# Patient Record
Sex: Female | Born: 1950 | Race: White | Hispanic: No | Smoking: Former smoker
Health system: Southern US, Community
[De-identification: ages and names within clinical notes are randomized; demographics above are authoritative.]

## PROBLEM LIST (undated history)

## (undated) DIAGNOSIS — I219 Acute myocardial infarction, unspecified: Secondary | ICD-10-CM

## (undated) DIAGNOSIS — I2510 Atherosclerotic heart disease of native coronary artery without angina pectoris: Secondary | ICD-10-CM

## (undated) DIAGNOSIS — Z720 Tobacco use: Secondary | ICD-10-CM

## (undated) DIAGNOSIS — I10 Essential (primary) hypertension: Secondary | ICD-10-CM

## (undated) DIAGNOSIS — M1990 Unspecified osteoarthritis, unspecified site: Secondary | ICD-10-CM

## (undated) DIAGNOSIS — E785 Hyperlipidemia, unspecified: Secondary | ICD-10-CM

## (undated) HISTORY — DX: Atherosclerotic heart disease of native coronary artery without angina pectoris: I25.10

## (undated) HISTORY — DX: Tobacco use: Z72.0

## (undated) HISTORY — DX: Hyperlipidemia, unspecified: E78.5

## (undated) HISTORY — PX: ABDOMINAL HYSTERECTOMY: SHX81

## (undated) HISTORY — PX: TUBAL LIGATION: SHX77

---

## 2009-08-17 ENCOUNTER — Inpatient Hospital Stay (HOSPITAL_COMMUNITY): Admission: EM | Admit: 2009-08-17 | Discharge: 2009-08-21 | Payer: Self-pay | Admitting: Internal Medicine

## 2009-08-17 ENCOUNTER — Ambulatory Visit: Payer: Self-pay | Admitting: Internal Medicine

## 2009-08-17 DIAGNOSIS — I219 Acute myocardial infarction, unspecified: Secondary | ICD-10-CM

## 2009-08-17 HISTORY — DX: Acute myocardial infarction, unspecified: I21.9

## 2009-08-17 HISTORY — PX: CORONARY ANGIOPLASTY WITH STENT PLACEMENT: SHX49

## 2009-08-18 ENCOUNTER — Encounter: Payer: Self-pay | Admitting: Cardiovascular Disease

## 2009-09-02 ENCOUNTER — Telehealth: Payer: Self-pay | Admitting: Cardiovascular Disease

## 2009-09-09 DIAGNOSIS — E7800 Pure hypercholesterolemia, unspecified: Secondary | ICD-10-CM | POA: Insufficient documentation

## 2009-09-09 DIAGNOSIS — F17200 Nicotine dependence, unspecified, uncomplicated: Secondary | ICD-10-CM

## 2009-09-09 DIAGNOSIS — I2119 ST elevation (STEMI) myocardial infarction involving other coronary artery of inferior wall: Secondary | ICD-10-CM | POA: Insufficient documentation

## 2009-09-09 DIAGNOSIS — I25110 Atherosclerotic heart disease of native coronary artery with unstable angina pectoris: Secondary | ICD-10-CM

## 2009-09-09 DIAGNOSIS — I959 Hypotension, unspecified: Secondary | ICD-10-CM

## 2009-09-15 ENCOUNTER — Ambulatory Visit: Payer: Self-pay | Admitting: Cardiovascular Disease

## 2009-11-08 ENCOUNTER — Ambulatory Visit: Payer: Self-pay | Admitting: Cardiovascular Disease

## 2009-11-16 LAB — CONVERTED CEMR LAB
ALT: 19 units/L (ref 0–35)
AST: 21 units/L (ref 0–37)
Alkaline Phosphatase: 73 units/L (ref 39–117)
Bilirubin, Direct: 0.1 mg/dL (ref 0.0–0.3)
Cholesterol: 161 mg/dL (ref 0–200)
LDL Cholesterol: 88 mg/dL (ref 0–99)
Total Bilirubin: 1.1 mg/dL (ref 0.3–1.2)
Total CHOL/HDL Ratio: 3
Triglycerides: 101 mg/dL (ref 0.0–149.0)
VLDL: 20.2 mg/dL (ref 0.0–40.0)

## 2009-11-21 ENCOUNTER — Telehealth: Payer: Self-pay | Admitting: Cardiovascular Disease

## 2009-12-20 ENCOUNTER — Telehealth: Payer: Self-pay | Admitting: Cardiovascular Disease

## 2010-01-02 ENCOUNTER — Encounter (INDEPENDENT_AMBULATORY_CARE_PROVIDER_SITE_OTHER): Payer: Self-pay | Admitting: *Deleted

## 2010-01-02 ENCOUNTER — Telehealth: Payer: Self-pay | Admitting: Cardiovascular Disease

## 2010-03-07 ENCOUNTER — Ambulatory Visit: Payer: Self-pay

## 2010-03-07 ENCOUNTER — Ambulatory Visit: Payer: Self-pay | Admitting: Cardiovascular Disease

## 2010-05-03 ENCOUNTER — Telehealth: Payer: Self-pay | Admitting: Cardiovascular Disease

## 2010-06-06 NOTE — Assessment & Plan Note (Signed)
Summary: EPH/JSS   Visit Type:  EPH Primary Provider:  No PCP at this time  CC:  None.  History of Present Illness: 60 yo WF with history of tobacco abuse with recent admission to [facility] April 13,2011 with NSTEMI and was found to have subtotally occluded distal RCA. An Endeavor  Drug eluting stent was placed in the distal RCA. She was also found to have moderate disease in the mid LAD (50%), mid Circumflex (40%), proximal RCA (40%). She has done well since discharge. She has had a few episodes of chest pain that is mild. No associated SOB or diaphoresis. Overall, doing well. She has had some anxiety attacks at work, no associated CP. There are no clear palpitations or irregularity of her heart rhythm.   Current Medications (verified): 1)  Aspirin Ec 325 Mg Tbec (Aspirin) .... Take One Tablet By Mouth Daily 2)  Carvedilol 3.125 Mg Tabs (Carvedilol) .Marland Kitchen.. 1 Tab Once Daily 3)  Nitrostat 0.4 Mg Subl (Nitroglycerin) .Marland Kitchen.. 1 Tablet Under Tongue At Onset of Chest Pain; You May Repeat Every 5 Minutes For Up To 3 Doses. 4)  Effient 10 Mg Tabs (Prasugrel Hcl) .Marland Kitchen.. 1 Tab Once Daily 5)  Crestor 40 Mg Tabs (Rosuvastatin Calcium) .Marland Kitchen.. 1 Tab At Bedtime 6)  Ibuprofen 200 Mg Caps (Ibuprofen) .... 3 Tabs Q8 Hour As Needed  Allergies: 1)  ! Doxycycline  Past History:  Past Medical History: NSTEMI 4/11 with subtotally occluded RCA CAD, NATIVE VESSEL (ICD-414.01)-cath 08/17/09 with subtotally occluded RCA, moderate disease LAD. HYPERCHOLESTEROLEMIA (ICD-272.0) HYPOTENSION (ICD-458.9) TOBACCO USER (ICD-305.1)-former, stopped April 2011.    Past Surgical History: Reviewed history from 09/09/2009 and no changes required.  Laparoscopic hysterectomy with a right ovarian  removal.   Family History: Reviewed history from 09/09/2009 and no changes required.  Family history is positive for coronary   artery disease, both with a father and brother having heart problems and  bypass surgery in the  past.   Mom is living at the age of 70 in good health  Social History: The patient has a almost 1-pack per day tobacco use   history.   She occasionally has alcohol-no abuse.   No illicit drug use Divorced, 2 children  Review of Systems       The patient complains of chest pain and anxiety.  The patient denies fatigue, malaise, fever, weight gain/loss, vision loss, decreased hearing, hoarseness, palpitations, prolonged cough, wheezing, sleep apnea, coughing up blood, abdominal pain, blood in stool, nausea, vomiting, diarrhea, heartburn, incontinence, blood in urine, muscle weakness, joint pain, leg swelling, rash, skin lesions, headache, fainting, dizziness, depression, enlarged lymph nodes, easy bruising or bleeding, and environmental allergies.    Vital Signs:  Patient profile:   60 year old female Height:      64 inches Weight:      129 pounds BMI:     22.22 Pulse rate:   62 / minute Pulse rhythm:   irregular BP sitting:   112 / 80  (left arm) Cuff size:   large  Vitals Entered By: Danielle Rankin, CMA (Sep 15, 2009 2:41 PM)  Physical Exam  General:  General: Well developed, well nourished, NAD HEENT: OP clear, mucus membranes moist SKIN: warm, dry Neuro: No focal deficits Musculoskeletal: Muscle strength 5/5 all ext Psychiatric: Mood and affect normal Neck: No JVD, no carotid bruits, no thyromegaly, no lymphadenopathy. Lungs:Clear bilaterally, no wheezes, rhonci, crackles CV: RRR no murmurs, gallops rubs Abdomen: soft, NT, ND, BS present Extremities: No edema,  pulses 2+.    EKG  Procedure date:  09/15/2009  Findings:      NSR, rate 62 bpm. Septal infarct. Inferior T wave inversion.   Echocardiogram  Procedure date:  08/18/2009  Findings:      Left ventricle: The cavity size was normal. Wall thickness was     normal. Systolic function was mildly reduced. The estimated     ejection fraction was in the range of 45% to 50%. Possible     hypokinesis of the  inferoposterior myocardium.   - Mitral valve: Mild regurgitation.   - Left atrium: The atrium was mildly dilated.  Cardiac Cath  Procedure date:  08/18/2009  Findings:      HEMODYNAMIC FINDINGS:  Central aortic pressure 116/68.  Left ventricular   pressure 117/14.  Left ventricular end-diastolic pressure 20.      ANGIOGRAPHIC FINDINGS:   1. The left main coronary artery had no obstructive disease.   2. The left anterior descending was a large vessel that coursed to the       apex and gave off a large diagonal branch.  There appeared to be a       50-60% stenosis in the mid LAD after the diagonal branch.  The       diagonal branch itself was a large-caliber vessel with mild plaque       disease.   3. The circumflex artery was a moderate-sized vessel that gave off       moderate-sized obtuse marginal branch.  There was a 40% stenosis in       the midportion of the vessel just prior to the takeoff of the       obtuse marginal branch.   4. The right coronary artery was a moderate-sized dominant vessel that       had a 40% tubular plaque extending from the midportion of the       vessel down to the distal portion of the mid segment where there       was a 99% subtotal occlusion.   5. Left ventricular angiogram was performed in the RAO projection       showed overall preserved left ventricular systolic function with       ejection fraction of 50% and inferior wall hypokinesis.      IMPRESSION:   1. Subtotal occlusion of the mid right coronary artery.   2. Non-ST-elevation myocardial infarction.   3. Moderate disease in the mid left anterior descending.   4. Overall preserved left ventricular systolic function with       hypokinesis of the inferior wall.   5. Successful percutaneous coronary intervention with placement of a       drug-eluting stent in the mid right coronary artery.   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 Will need one year of ASA/Effient.  I have encouraged her to use NTG for chest pain. If she has recurrent severe pain, may need to perform stress test to localize ischemia. Moderate disease in LAD that we did not treat at the time of the initial cath. Preserved LV function (low normal 50%, with inferior wall hypokinesis).  Her updated medication list for this problem includes:    Aspirin Ec 325 Mg Tbec (Aspirin) .Marland Kitchen... Take one tablet by mouth daily    Carvedilol 3.125 Mg Tabs (Carvedilol) .Marland Kitchen... 1 tab once daily    Nitrostat 0.4 Mg Subl (Nitroglycerin) .Marland Kitchen... 1 tablet under  tongue at onset of chest pain; you may repeat every 5 minutes for up to 3 doses.    Effient 10 Mg Tabs (Prasugrel hcl) .Marland Kitchen... 1 tab once daily  Her updated medication list for this problem includes:    Aspirin Ec 325 Mg Tbec (Aspirin) .Marland Kitchen... Take one tablet by mouth daily    Carvedilol 3.125 Mg Tabs (Carvedilol) .Marland Kitchen... 1 tab once daily    Nitrostat 0.4 Mg Subl (Nitroglycerin) .Marland Kitchen... 1 tablet under tongue at onset of chest pain; you may repeat every 5 minutes for up to 3 doses.    Effient 10 Mg Tabs (Prasugrel hcl) .Marland Kitchen... 1 tab once daily  Problem # 2:  CAD, NATIVE VESSEL (ICD-414.01) See above.   Her updated medication list for this problem includes:    Aspirin Ec 325 Mg Tbec (Aspirin) .Marland Kitchen... Take one tablet by mouth daily    Carvedilol 3.125 Mg Tabs (Carvedilol) .Marland Kitchen... 1 tab once daily    Nitrostat 0.4 Mg Subl (Nitroglycerin) .Marland Kitchen... 1 tablet under tongue at onset of chest pain; you may repeat every 5 minutes for up to 3 doses.    Effient 10 Mg Tabs (Prasugrel hcl) .Marland Kitchen... 1 tab once daily  Her updated medication list for this problem includes:    Aspirin Ec 325 Mg Tbec (Aspirin) .Marland Kitchen... Take one tablet by mouth daily    Carvedilol 3.125 Mg Tabs (Carvedilol) .Marland Kitchen... 1 tab once daily    Nitrostat 0.4 Mg Subl (Nitroglycerin) .Marland Kitchen... 1 tablet under tongue at onset of chest pain; you  may repeat every 5 minutes for up to 3 doses.    Effient 10 Mg Tabs (Prasugrel hcl) .Marland Kitchen... 1 tab once daily  Orders: EKG w/ Interpretation (93000)  Problem # 3:  HYPERCHOLESTEROLEMIA (ICD-272.0) On statin. Will need repeat lipids and LFTs in 8 weeks.   Her updated medication list for this problem includes:    Crestor 40 Mg Tabs (Rosuvastatin calcium) .Marland Kitchen... 1 tab at bedtime  Her updated medication list for this problem includes:    Crestor 40 Mg Tabs (Rosuvastatin calcium) .Marland Kitchen... 1 tab at bedtime  Problem # 4:  TOBACCO USER (ICD-305.1) She has stopped smoking. I congratulated her on this.   Patient Instructions: 1)  Your physician recommends that you schedule a follow-up appointment in: 6 months 2)  Your physician recommends that you return for a FASTING lipid profile and liver profile in 8 weeks.  (first full week in July)  3)  Your physician recommends that you continue on your current medications as directed. Please refer to the Current Medication list given to you today.

## 2010-06-06 NOTE — Progress Notes (Signed)
Summary: can change crestor to something cheaper (lm tcb)  Phone Note Call from Patient   Caller: Patient Reason for Call: Talk to Nurse Summary of Call: pt was given an rx for crestor, she doesn't have insurance and cannot afford it, is there anything comprable she can take instead? pls call [phone] Initial call taken by: Glynda Jaeger,  November 21, 2009 3:45 PM  Follow-up for Phone Call        We can switch her to Pravastatin 40 mg per day. cdm Follow-up by: Verne Carrow, MD,  November 23, 2009 3:43 PM  Additional Follow-up for Phone Call Additional follow up Details #1::        RN just received mess today, have called pt and left mess to call back Meredith Staggers, RN  November 23, 2009 4:02 PM    per pt calling back [phone] Lorne Skeens  November 24, 2009 8:47 AM     Additional Follow-up for Phone Call Additional follow up Details #2::    Called patient back and advised that CM ordered Pravachol 40mg  generic instead of Crestor for cost and will send to [facility] in [place]. Follow-up by: J REISS RN  New/Updated Medications: PRAVASTATIN SODIUM 40 MG TABS (PRAVASTATIN SODIUM) 1 tab every day at bedtime Prescriptions: PRAVASTATIN SODIUM 40 MG TABS (PRAVASTATIN SODIUM) 1 tab every day at bedtime  #30 x 6   Entered by:   Layne Benton, RN, BSN   Authorized by:   Verne Carrow, MD   Signed by:   Layne Benton, RN, BSN on 11/24/2009   Method used:   Electronically to        [facility]  [address] (retail)       [address]       [place], [place]  [address]       Ph: [phone]       Fax: [phone]   RxID:   [id number]

## 2010-06-06 NOTE — Assessment & Plan Note (Signed)
Summary: 6 month follow up/414.01,410.42/pla   Visit Type:  Follow-up Primary Provider:  No PCP at this time  CC:  no complaints.  History of Present Illness: 60 yo WF with history of tobacco abuse with recent admission to [facility] April 13,2011 with NSTEMI and was found to have subtotally occluded distal RCA. An Endeavor  Drug eluting stent was placed in the distal RCA. She was also found to have moderate disease in the mid LAD (50%), mid Circumflex (40%), proximal RCA (40%). She has done well since discharge. She has had a few episodes of chest pain that is mild. No associated SOB or diaphoresis. Overall, doing well. She has had some anxiety attacks at work, no associated CP. There are no clear palpitations or irregularity of her heart rhythm.   She does complain of pain at her right groin access site from cath in April. Pain comes and goes. Occasional pain in lateral aspect of right calf. No pain with walking.   Current Medications (verified): 1)  Aspirin 81 Mg Tbec (Aspirin) .... Take One Tablet By Mouth Daily 2)  Carvedilol 3.125 Mg Tabs (Carvedilol) .Marland Kitchen.. 1 Tab Once Daily 3)  Nitrostat 0.4 Mg Subl (Nitroglycerin) .Marland Kitchen.. 1 Tablet Under Tongue At Onset of Chest Pain; You May Repeat Every 5 Minutes For Up To 3 Doses. 4)  Effient 10 Mg Tabs (Prasugrel Hcl) .Marland Kitchen.. 1 Tab Once Daily 5)  Ibuprofen 200 Mg Caps (Ibuprofen) .... 3 Tabs Q8 Hour As Needed 6)  Pravastatin Sodium 40 Mg Tabs (Pravastatin Sodium) .Marland Kitchen.. 1 Tab Every Day At Bedtime  Allergies (verified): 1)  ! Doxycycline  Past History:  Past Medical History: NSTEMI 4/11 with subtotally occluded RCA CAD, NATIVE VESSEL (ICD-414.01)-cath 08/17/09 with subtotally occluded RCA, moderate disease LAD. HYPERCHOLESTEROLEMIA (ICD-272.0) HYPOTENSION (ICD-458.9) TOBACCO USER (ICD-305.1)-stopped April 2011 but started back in June 2011.     Social History: Reviewed history from 09/15/2009 and no changes required. The patient has a  almost 1-pack per day tobacco use   history.   She occasionally has alcohol-no abuse.   No illicit drug use Divorced, 2 children  Review of Systems  The patient denies fatigue, malaise, fever, weight gain/loss, vision loss, decreased hearing, hoarseness, chest pain, palpitations, shortness of breath, prolonged cough, wheezing, sleep apnea, coughing up blood, abdominal pain, blood in stool, nausea, vomiting, diarrhea, heartburn, incontinence, blood in urine, muscle weakness, joint pain, leg swelling, rash, skin lesions, headache, fainting, dizziness, depression, anxiety, enlarged lymph nodes, easy bruising or bleeding, and environmental allergies.    Vital Signs:  Patient profile:   60 year old female Height:      64 inches Weight:      132 pounds BMI:     22.74 Pulse rate:   80 / minute BP sitting:   92 / 64  (left arm) Cuff size:   regular  Vitals Entered By: Hardin Negus, RMA (March 07, 2010 1:55 PM)  Physical Exam  General:  General: Well developed, well nourished, NAD Musculoskeletal: Muscle strength 5/5 all ext Psychiatric: Mood and affect normal Neck: No JVD, no carotid bruits, no thyromegaly, no lymphadenopathy. Lungs:Clear bilaterally, no wheezes, rhonci, crackles CV: RRR no murmurs, gallops rubs Abdomen: soft, NT, ND, BS present Extremities: No edema, pulses 1+ bilateral DP/PT. Femoral pulses 2+. no right femoral bruit. No hematoma. No mass.     Impression & Recommendations:  Problem # 1:  CAD, NATIVE VESSEL (ICD-414.01) Stable. No change in medications. She will need at least one year of  dual antiplatelet therapy with ASA and Effient. Will also continue Coreg and statin. Will get u/s of right groin with c/o pain and recent cath-rule out pseudoaneurysm.   Her updated medication list for this problem includes:    Aspirin 81 Mg Tbec (Aspirin) .Marland Kitchen... Take one tablet by mouth daily    Carvedilol 3.125 Mg Tabs (Carvedilol) .Marland Kitchen... 1 tab once daily    Nitrostat 0.4 Mg  Subl (Nitroglycerin) .Marland Kitchen... 1 tablet under tongue at onset of chest pain; you may repeat every 5 minutes for up to 3 doses.    Effient 10 Mg Tabs (Prasugrel hcl) .Marland Kitchen... 1 tab once daily  Orders: Arterial Duplex Lower Extremity (Arterial Duplex Low)  Problem # 2:  TOBACCO USER (ICD-305.1) Smoking cessation encouraged.   Patient Instructions: 1)  Your physician recommends that you schedule a follow-up appointment in: 6 months 2)  Your physician recommends that you continue on your current medications as directed. Please refer to the Current Medication list given to you today. 3)  Your physician has requested that you have a lower or upper extremity arterial duplex.  This test is an ultrasound of the arteries in the legs or arms.  It looks at arterial blood flow in the legs and arms.  Allow one hour for Lower and Upper Arterial scans. There are no restrictions or special instructions. This was done today

## 2010-06-06 NOTE — Progress Notes (Signed)
Summary: refill meds  Phone Note Refill Request Call back at Home Phone [phone] Message from:  Patient on December 20, 2009 11:22 AM  Refills Requested: Medication #1:  EFFIENT 10 MG TABS 1 tab once daily pt get assistance.   Initial call taken by: Lorne Skeens,  December 20, 2009 11:23 AM  Follow-up for Phone Call        CMA s/w [facility] rep who advised me that they do not take prescription orders over the phone that there is a form that has to be filled out.. Rep faxedd over form to me while we were speaking. CMA will fill out form and fax back to Assitance program. Lacoya Wilbanks, CMA  December 20, 2009 1:52 PM

## 2010-06-06 NOTE — Progress Notes (Signed)
Summary: Effient samples  Phone Note Outgoing Call   Summary of Call: Received 4 month supply of Effient in office today. I called [facility] and spoke with Hanan who confirmed pt has been accepted in assistance program. Refills can be requested on July 22 and require a completed refill request form  faxed to [facility]. Each shipment is a 4 month supply.  I called pt to let her know Effient samples were here. Left message to call back. Samples left at front desk. (Effient -120 tabs-10/2010-[id number] A) Initial call taken by: Dossie Arbour, RN, BSN,  September 02, 2009 4:47 PM  Follow-up for Phone Call        Thanks, cdm Follow-up by: Verne Carrow, MD,  Sep 12, 2009 8:58 AM  Additional Follow-up for Phone Call Additional follow up Details #1::        left message to call back. Dossie Arbour, RN, BSN  Sep 14, 2009 10:39 AM Spoke with pt. She will pick up today at office visit. Additional Follow-up by: Dossie Arbour, RN, BSN,  Sep 15, 2009 8:56 AM     Appended Document: Effient samples Samples given to pt. Instructed pt to call us 3 weeks prior to needing a refill so we can reorder.

## 2010-06-06 NOTE — Progress Notes (Signed)
Summary: pt aware effient in and to decrease ASA to 81  Phone Note Outgoing Call Call back at pt's work place   Summary of Call: CMA s/w pt to advise her Effient is in from [facility]. Pt then told me she was having excessive bruising and wanted to know what shew could do. I states that she was on 325 mg ASA and the Effient. I explained that I would talk to Dr. Clifton Docter and see what he wants to do, (ie 81 mg ASA).  Willamina Grieshop, CMA  January 02, 2010 9:49 AM   Follow-up for Phone Call        CMA s/w Dr. Clifton Rymer about possible decrease in ASA for pt. Dr. Clifton Tisdel states lets decrease ASA to 81 mg ASA 1 once daily CMA notified pt to decrease ASA. Kimori Tartaglia, CMA  January 02, 2010 9:50 AM

## 2010-06-06 NOTE — Miscellaneous (Signed)
Summary: med update  Clinical Lists Changes  Medications: Changed medication from ASPIRIN EC 325 MG TBEC (ASPIRIN) Take one tablet by mouth daily to ASPIRIN 81 MG TBEC (ASPIRIN) Take one tablet by mouth daily

## 2010-06-08 NOTE — Progress Notes (Signed)
Summary: rx's called in today to [phone] [facility] rx sent in to [facility]  Phone Note Refill Request Call back at [phone] Message from:  Patient on [facility] in [place]/ [phone]  Refills Requested: Medication #1:  EFFIENT 10 MG TABS 1 tab once daily  Medication #2:  CARVEDILOL 3.125 MG TABS 1 tab once daily pt needs a 10day supply of effient called in to the [facility] in [place] listed above and then her regular order called in so she can pick it up from the office. the carvedilol needs to be called in to the Pharm listed above as well please call pt when this is done   Initial call taken by: Omer Jack,  May 03, 2010 12:31 PM    Prescriptions: EFFIENT 10 MG TABS (PRASUGREL HCL) 1 tab once daily  #10 x 1   Entered by:   Danielle Rankin, CMA   Authorized by:   Verne Carrow, MD   Signed by:   Danielle Rankin, CMA on 05/03/2010   Method used:   Telephoned to ...       [facility]  [address] (retail)       [address]       [place], [place]  [address]       Ph: [phone]       Fax: [phone]   RxID:   [id number] CARVEDILOL 3.125 MG TABS (CARVEDILOL) 1 tab once daily  #30 x 11   Entered by:   Danielle Rankin, CMA   Authorized by:   Verne Carrow, MD   Signed by:   Danielle Rankin, CMA on 05/03/2010   Method used:   Telephoned to ...       [facility]  [address] (retail)       [address]       [place], [place]  [address]       Ph: [phone]       Fax: [phone]   RxID:   [id number]

## 2010-07-25 LAB — CBC
HCT: 33.1 % — ABNORMAL LOW (ref 36.0–46.0)
MCV: 97.5 fL (ref 78.0–100.0)
RDW: 13.6 % (ref 11.5–15.5)

## 2010-07-25 LAB — BASIC METABOLIC PANEL
BUN: 13 mg/dL (ref 6–23)
CO2: 26 mEq/L (ref 19–32)
Creatinine, Ser: 0.82 mg/dL (ref 0.4–1.2)
GFR calc Af Amer: >60 mL/min (ref >60)
GFR calc Af Amer: >60 mL/min (ref >60)
GFR calc non Af Amer: >60 mL/min (ref >60)
Potassium: 3.7 mEq/L (ref 3.5–5.1)
Sodium: 138 mEq/L (ref 135–145)
Sodium: 140 mEq/L (ref 135–145)

## 2010-07-26 LAB — URINE MICROSCOPIC-ADD ON

## 2010-07-26 LAB — MRSA PCR SCREENING: MRSA by PCR: NEGATIVE

## 2010-07-26 LAB — CBC
HCT: 40.1 % (ref 36.0–46.0)
Hemoglobin: 11.7 g/dL — ABNORMAL LOW (ref 12.0–15.0)
Hemoglobin: 14 g/dL (ref 12.0–15.0)
RDW: 13.8 % (ref 11.5–15.5)
RDW: 13.8 % (ref 11.5–15.5)
WBC: 11.8 10*3/uL — ABNORMAL HIGH (ref 4.0–10.5)
WBC: 13 10*3/uL — ABNORMAL HIGH (ref 4.0–10.5)

## 2010-07-26 LAB — TROPONIN I: Troponin I: 0.11 ng/mL — ABNORMAL HIGH (ref 0.00–0.06)

## 2010-07-26 LAB — URINE CULTURE: Culture: NO GROWTH

## 2010-07-26 LAB — DIFFERENTIAL
Basophils Absolute: 0.1 10*3/uL (ref 0.0–0.1)
Eosinophils Relative: 1 % (ref 0–5)
Lymphocytes Relative: 14 % (ref 12–46)
Lymphs Abs: 1.7 10*3/uL (ref 0.7–4.0)
Monocytes Absolute: 0.3 10*3/uL (ref 0.1–1.0)
Neutro Abs: 9.7 10*3/uL — ABNORMAL HIGH (ref 1.7–7.7)

## 2010-07-26 LAB — BASIC METABOLIC PANEL
Calcium: 8.4 mg/dL (ref 8.4–10.5)
Calcium: 9 mg/dL (ref 8.4–10.5)
GFR calc Af Amer: >60 mL/min (ref >60)
GFR calc Af Amer: >60 mL/min (ref >60)
GFR calc non Af Amer: >60 mL/min (ref >60)
GFR calc non Af Amer: >60 mL/min (ref >60)
Glucose, Bld: 138 mg/dL — ABNORMAL HIGH (ref 70–99)
Potassium: 3.9 mEq/L (ref 3.5–5.1)
Sodium: 137 mEq/L (ref 135–145)
Sodium: 141 mEq/L (ref 135–145)

## 2010-07-26 LAB — LIPID PANEL
HDL: 51 mg/dL (ref >39)
LDL Cholesterol: 111 mg/dL — ABNORMAL HIGH (ref 0–99)
Triglycerides: 141 mg/dL (ref <150)

## 2010-07-26 LAB — CK TOTAL AND CKMB (NOT AT ARMC)
CK, MB: 4.6 ng/mL — ABNORMAL HIGH (ref 0.3–4.0)
Relative Index: INVALID (ref 0.0–2.5)

## 2010-07-26 LAB — D-DIMER, QUANTITATIVE: D-Dimer, Quant: 0.3 ug/mL-FEU (ref 0.00–0.48)

## 2010-07-26 LAB — POCT CARDIAC MARKERS: Troponin i, poc: <0.05 ng/mL (ref 0.00–0.09)

## 2010-07-26 LAB — URINALYSIS, ROUTINE W REFLEX MICROSCOPIC
Leukocytes, UA: NEGATIVE
Nitrite: NEGATIVE
Specific Gravity, Urine: >1.03 — ABNORMAL HIGH (ref 1.005–1.030)
Urobilinogen, UA: 0.2 mg/dL (ref 0.0–1.0)
pH: 5 (ref 5.0–8.0)

## 2010-07-26 LAB — HEMOGLOBIN A1C
Hgb A1c MFr Bld: 5.7 % — ABNORMAL HIGH (ref <5.7)
Mean Plasma Glucose: 117 mg/dL — ABNORMAL HIGH (ref <117)

## 2010-07-26 LAB — HEPATIC FUNCTION PANEL
Bilirubin, Direct: 0.1 mg/dL (ref 0.0–0.3)
Indirect Bilirubin: 0.5 mg/dL (ref 0.3–0.9)
Total Protein: 6.9 g/dL (ref 6.0–8.3)

## 2010-10-04 ENCOUNTER — Ambulatory Visit: Payer: Self-pay | Admitting: Cardiovascular Disease

## 2010-10-24 ENCOUNTER — Encounter: Payer: Self-pay | Admitting: Cardiovascular Disease

## 2010-10-25 ENCOUNTER — Ambulatory Visit (INDEPENDENT_AMBULATORY_CARE_PROVIDER_SITE_OTHER): Payer: Self-pay | Admitting: Cardiovascular Disease

## 2010-10-25 ENCOUNTER — Encounter: Payer: Self-pay | Admitting: Cardiovascular Disease

## 2010-10-25 VITALS — BP 118/73 | HR 78 | Ht 64.0 in | Wt 135.0 lb

## 2010-10-25 DIAGNOSIS — F17200 Nicotine dependence, unspecified, uncomplicated: Secondary | ICD-10-CM

## 2010-10-25 DIAGNOSIS — I2510 Atherosclerotic heart disease of native coronary artery without angina pectoris: Secondary | ICD-10-CM

## 2010-10-25 NOTE — Assessment & Plan Note (Signed)
Complete smoking cessation recommended. She wishes to stop.

## 2010-10-25 NOTE — Assessment & Plan Note (Addendum)
Stable. Will continue ASA, statin and beta blocker.

## 2010-10-25 NOTE — Progress Notes (Signed)
History of Present Illness:60 yo WF with history of tobacco abuse with admission to [facility] April 13,2011 with NSTEMI and was found to have subtotally occluded distal RCA. An Endeavor  Drug eluting stent was placed in the distal RCA. She was also found to have moderate disease in the mid LAD (50%), mid Circumflex (40%), proximal RCA (40%).  She is here for follow up. She has not been having chest pain or SOB. She lost her job since her last visit. She is on umemployment. Her energy level is ok. She has restarted smoking. She stopped her Effient after one year.    Past Medical History  Diagnosis Date  . Coronary artery disease     NATIVE VESSEL (ICD-414.01)-cath 08/17/09 with subtotally occluded RCA, moderate disease LAD.  Marland Kitchen Hyperlipidemia   . Hypotension   . Tobacco user     Past Surgical History  Procedure Date  . Hysterectomy     laproscopic    Current Outpatient Prescriptions  Medication Sig Dispense Refill  . aspirin 81 MG tablet Take 81 mg by mouth daily.        . carvedilol (COREG) 3.125 MG tablet Take 3.125 mg by mouth 2 (two) times daily with a meal.        . nitroGLYCERIN (NITROSTAT) 0.4 MG SL tablet Place 0.4 mg under the tongue every 5 (five) minutes as needed.        . pravastatin (PRAVACHOL) 40 MG tablet Take 40 mg by mouth daily.        Marland Kitchen DISCONTD: ibuprofen (ADVIL,MOTRIN) 200 MG tablet Take 200 mg by mouth every 8 (eight) hours as needed.        Marland Kitchen DISCONTD: prasugrel (EFFIENT) 10 MG TABS Take 10 mg by mouth daily.          Allergies  Allergen Reactions  . Doxycycline     History   Social History  . Marital Status: Divorced    Spouse Name: N/A    Number of Children: N/A  . Years of Education: N/A   Occupational History  . Not on file.   Social History Main Topics  . Smoking status: Not on file  . Smokeless tobacco: Not on file  . Alcohol Use:   . Drug Use:   . Sexually Active:    Other Topics Concern  . Not on file   Social History  Narrative   The patient has a almost 1-pack per day tobacco use  history.  She occasionally has alcohol-no abuse.  No illicit drug useDivorced, 2 children    Family History  Problem Relation Age of Onset  . Coronary artery disease    . Heart disease Father     bypass  . Heart disease Brother     bypass    Review of Systems:  As stated in the HPI and otherwise negative.   BP 118/73  Pulse 78  Ht 5\' 4"  (1.626 m)  Wt 135 lb (61.236 kg)  BMI 23.17 kg/m2  Physical Examination: General: Well developed, well nourished, NAD HEENT: OP clear, mucus membranes moist SKIN: warm, dry. No rashes. Neuro: No focal deficits Musculoskeletal: Muscle strength 5/5 all ext Psychiatric: Mood and affect normal Neck: No JVD, no carotid bruits, no thyromegaly, no lymphadenopathy. Lungs:Clear bilaterally, no wheezes, rhonci, crackles Cardiovascular: Regular rate and rhythm. No murmurs, gallops or rubs. Abdomen:Soft. Bowel sounds present. Non-tender.  Extremities: No lower extremity edema. Pulses are 2 + in the bilateral DP/PT.  EKG:NSR, rate 74 bpm.

## 2010-11-07 NOTE — Progress Notes (Signed)
Addended by: Judithe Modest D on: 11/07/2010 12:07 PM   Modules accepted: Orders

## 2011-02-26 ENCOUNTER — Telehealth: Payer: Self-pay | Admitting: Cardiovascular Disease

## 2011-02-26 MED ORDER — PRAVASTATIN SODIUM 40 MG PO TABS: 40.0000 mg | ORAL_TABLET | Freq: Every day | ORAL | Status: DC

## 2011-02-26 NOTE — Telephone Encounter (Signed)
Pravastatin 40 mg refill needed, with refill for one year, uses [facility]  [place] [phone]

## 2011-05-09 IMAGING — CR DG CHEST 1V PORT
1 series · 1 of 1 positions shown · non-contrast
Comparison: None.

CLINICAL DATA: Chest pain

PORTABLE CHEST - 1 VIEW

[view not recorded]
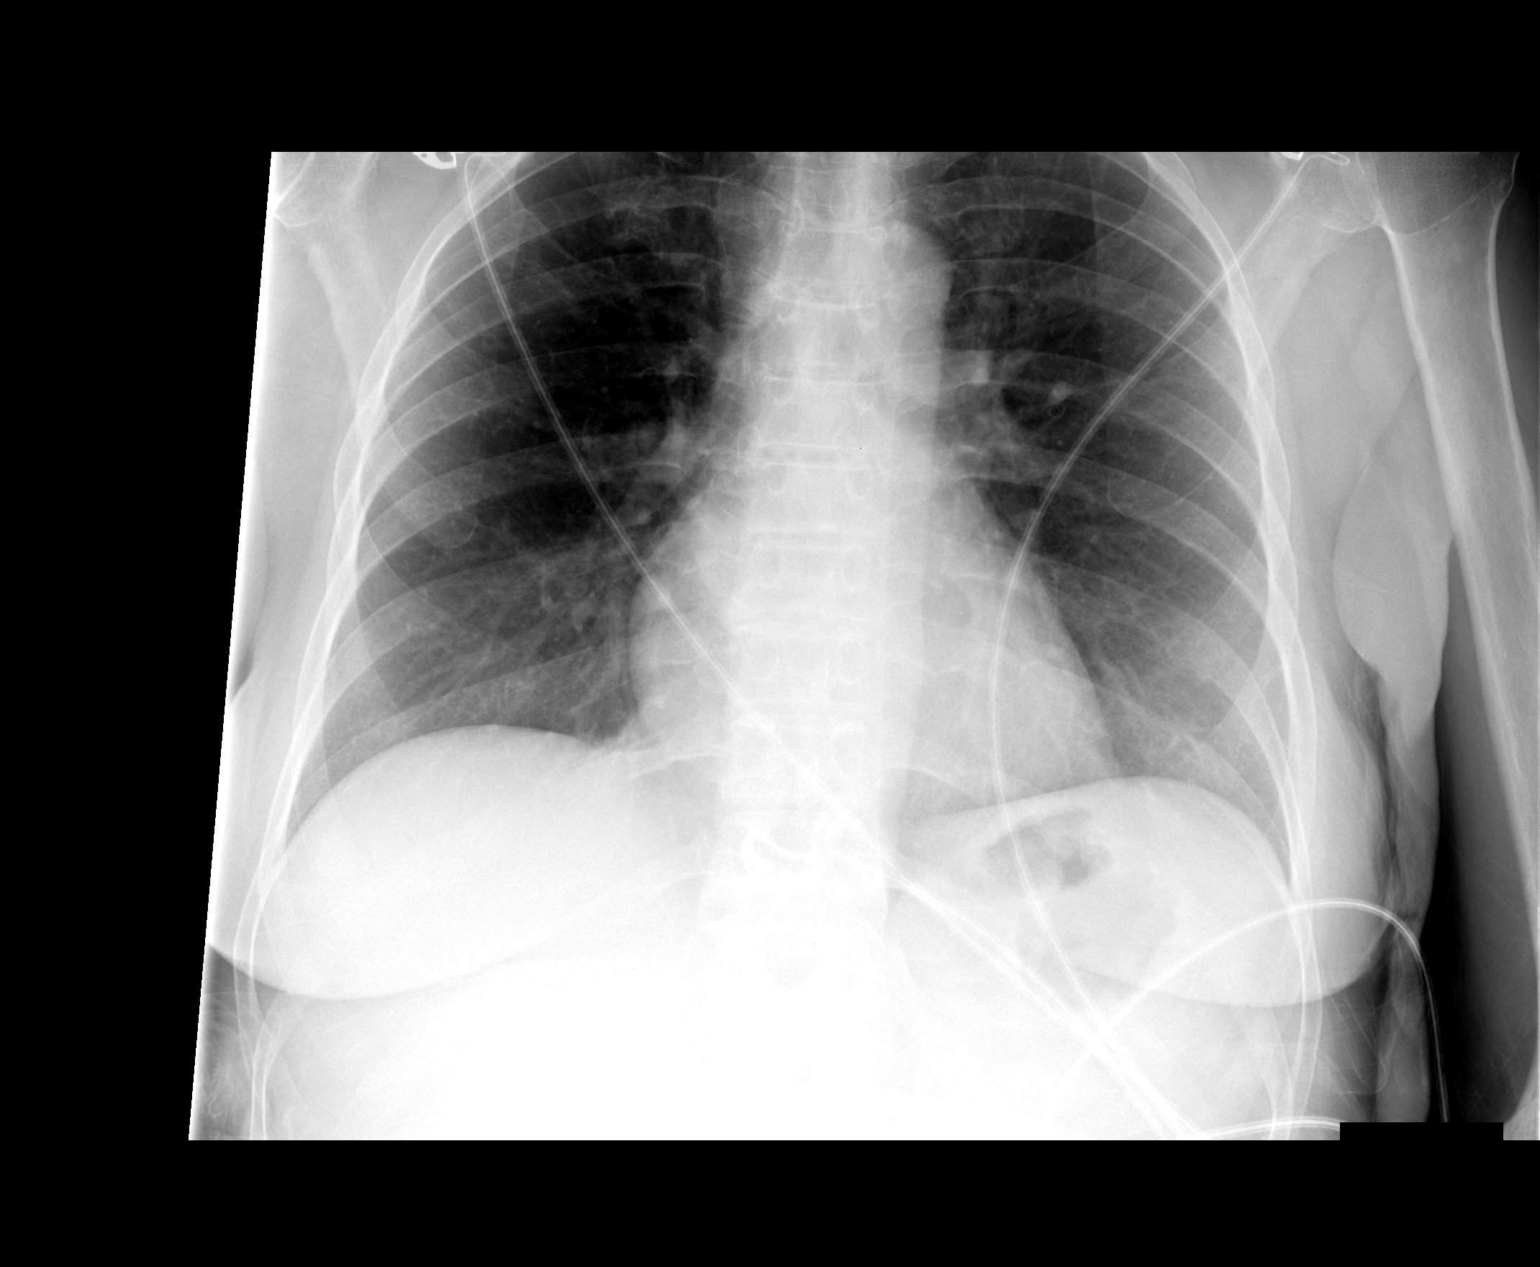

[1 of 1 positions shown; findings below may reference images not displayed]

FINDINGS: Normal heart.  Clear lungs.  No pneumothorax.  No rib
fracture.
IMPRESSION: No active cardiopulmonary disease.

## 2011-05-28 ENCOUNTER — Other Ambulatory Visit: Payer: Self-pay | Admitting: Cardiovascular Disease

## 2011-05-28 MED ORDER — NITROGLYCERIN 0.4 MG SL SUBL: 0.4000 mg | SUBLINGUAL_TABLET | SUBLINGUAL | Status: DC | PRN

## 2011-05-28 NOTE — Telephone Encounter (Signed)
[facility] [place] [phone].

## 2011-05-31 ENCOUNTER — Other Ambulatory Visit: Payer: Self-pay | Admitting: Cardiovascular Disease

## 2011-05-31 MED ORDER — NITROGLYCERIN 0.4 MG SL SUBL: 0.4000 mg | SUBLINGUAL_TABLET | SUBLINGUAL | Status: DC | PRN

## 2011-05-31 NOTE — Telephone Encounter (Signed)
New Msg: Pt is out of nitro and would like to have some on upcoming trip [place]. Please call RX in.

## 2011-06-01 ENCOUNTER — Telehealth: Payer: Self-pay | Admitting: Cardiovascular Disease

## 2011-06-01 MED ORDER — NITROGLYCERIN 0.4 MG SL SUBL: 0.4000 mg | SUBLINGUAL_TABLET | SUBLINGUAL | Status: DC | PRN

## 2011-06-01 NOTE — Telephone Encounter (Signed)
New Problem:     Patient called in needing a refill of her ezetimibe (ZETIA) 10 MG tablet at the pharmacy listed on her profile.

## 2011-06-01 NOTE — Telephone Encounter (Signed)
Pt did not need zetia she wanted nitro

## 2011-07-12 ENCOUNTER — Other Ambulatory Visit: Payer: Self-pay | Admitting: Cardiovascular Disease

## 2011-07-12 MED ORDER — CARVEDILOL 3.125 MG PO TABS: 3.1250 mg | ORAL_TABLET | Freq: Two times a day (BID) | ORAL | Status: DC

## 2011-09-26 ENCOUNTER — Other Ambulatory Visit: Payer: Self-pay | Admitting: Cardiovascular Disease

## 2011-09-26 MED ORDER — PRAVASTATIN SODIUM 40 MG PO TABS: 40.0000 mg | ORAL_TABLET | Freq: Every day | ORAL | Status: DC

## 2011-09-26 NOTE — Telephone Encounter (Signed)
Refill - Pravastatin (PRAVACHOL) 40 MG tablet   Verified preferred [employer]

## 2011-10-15 ENCOUNTER — Ambulatory Visit (INDEPENDENT_AMBULATORY_CARE_PROVIDER_SITE_OTHER): Payer: Self-pay | Admitting: Cardiovascular Disease

## 2011-10-15 ENCOUNTER — Encounter: Payer: Self-pay | Admitting: Cardiovascular Disease

## 2011-10-15 VITALS — BP 128/80 | HR 70 | Ht 64.0 in | Wt 135.0 lb

## 2011-10-15 DIAGNOSIS — I2510 Atherosclerotic heart disease of native coronary artery without angina pectoris: Secondary | ICD-10-CM

## 2011-10-15 LAB — HEPATIC FUNCTION PANEL
ALT: 24 U/L (ref 0–35)
AST: 26 U/L (ref 0–37)
Albumin: 4 g/dL (ref 3.5–5.2)
Alkaline Phosphatase: 95 U/L (ref 39–117)
Bilirubin, Direct: 0.1 mg/dL (ref 0.0–0.3)
Total Bilirubin: 0.8 mg/dL (ref 0.3–1.2)
Total Protein: 7.6 g/dL (ref 6.0–8.3)

## 2011-10-15 LAB — LIPID PANEL
Cholesterol: 202 mg/dL — ABNORMAL HIGH (ref 0–200)
HDL: 62.8 mg/dL (ref >39.00)
Triglycerides: 194 mg/dL — ABNORMAL HIGH (ref 0.0–149.0)

## 2011-10-15 LAB — LDL CHOLESTEROL, DIRECT: Direct LDL: 113.7 mg/dL

## 2011-10-15 NOTE — Assessment & Plan Note (Signed)
No chest pain or SOB. She is on an ASA, beta blocker and statin. Will check lipids and LFTs today. No other changes. Smoking cessation recommended.

## 2011-10-15 NOTE — Progress Notes (Signed)
History of Present Illness: 61 yo WF with history of CAD, hyperlipidemia, tobacco abuse here today for cardiac follow up. She was admitted to [facility] April 13,2011 with NSTEMI and was found to have subtotally occluded distal RCA. An [employer] eluting stent was placed in the distal RCA. She was also found to have moderate disease in the mid LAD (50%), mid Circumflex (40%), proximal RCA (40%). She was last seen in our office June 2012.   She is here for follow up. She has not been having chest pain or SOB. She continues to smoke. She has much stress in her life because of her adult daughter. She has had no near syncope, syncope or palpitations.    Primary Care Physician: John Giovanni  Last Lipid Profile:  Lipid Panel     Component Value Date/Time   CHOL 161 11/08/2009 0846   TRIG 101.0 11/08/2009 0846   HDL 53.10 11/08/2009 0846   CHOLHDL 3 11/08/2009 0846   VLDL 20.2 11/08/2009 0846   LDLCALC 88 11/08/2009 0846     Past Medical History  Diagnosis Date  . Coronary artery disease     NATIVE VESSEL (ICD-414.01)-cath 08/17/09 with subtotally occluded RCA, moderate disease LAD.  Marland Kitchen Hyperlipidemia   . Hypotension   . Tobacco user     Past Surgical History  Procedure Date  . Hysterectomy     laproscopic    Current Outpatient Prescriptions  Medication Sig Dispense Refill  . aspirin 81 MG tablet Take 81 mg by mouth daily.        . carvedilol (COREG) 3.125 MG tablet Take 1 tablet (3.125 mg total) by mouth 2 (two) times daily with a meal.  60 tablet  4  . nitroGLYCERIN (NITROSTAT) 0.4 MG SL tablet Place 1 tablet (0.4 mg total) under the tongue every 5 (five) minutes as needed.  25 tablet  4  . pravastatin (PRAVACHOL) 40 MG tablet Take 1 tablet (40 mg total) by mouth daily.  30 tablet  6    Allergies  Allergen Reactions  . Doxycycline     History   Social History  . Marital Status: Divorced    Spouse Name: N/A    Number of Children: N/A  . Years of Education: N/A     Occupational History  . Not on file.   Social History Main Topics  . Smoking status: Current Everyday Smoker  . Smokeless tobacco: Never Used  . Alcohol Use: Yes     seldom  . Drug Use: No  . Sexually Active: Not on file   Other Topics Concern  . Not on file   Social History Narrative   The patient has a almost 1-pack per day tobacco use  history.  She occasionally has alcohol-no abuse.  No illicit drug useDivorced, 2 children    Family History  Problem Relation Age of Onset  . Coronary artery disease    . Heart disease Father     bypass  . Heart disease Brother     bypass    Review of Systems:  As stated in the HPI and otherwise negative.   BP 128/80  Pulse 70  Ht 5\' 4"  (1.626 m)  Wt 135 lb (61.236 kg)  BMI 23.17 kg/m2  Physical Examination: General: Well developed, well nourished, NAD HEENT: OP clear, mucus membranes moist SKIN: warm, dry. No rashes. Neuro: No focal deficits Musculoskeletal: Muscle strength 5/5 all ext Psychiatric: Mood and affect normal Neck: No JVD, no carotid bruits, no  thyromegaly, no lymphadenopathy. Lungs:Clear bilaterally, no wheezes, rhonci, crackles Cardiovascular: Regular rate and rhythm. No murmurs, gallops or rubs. Abdomen:Soft. Bowel sounds present. Non-tender.  Extremities: No lower extremity edema. Pulses are 2 + in the bilateral DP/PT.  EKG: NSR, rate 70 bpm. Non-specific ST changes.

## 2011-10-15 NOTE — Patient Instructions (Signed)
Your physician wants you to follow-up in:  12 months.  You will receive a reminder letter in the mail two months in advance. If you don't receive a letter, please call our office to schedule the follow-up appointment.   

## 2011-10-16 ENCOUNTER — Other Ambulatory Visit: Payer: Self-pay | Admitting: *Deleted

## 2011-10-16 DIAGNOSIS — E7800 Pure hypercholesterolemia, unspecified: Secondary | ICD-10-CM

## 2011-10-16 MED ORDER — PRAVASTATIN SODIUM 40 MG PO TABS: 80.0000 mg | ORAL_TABLET | Freq: Every day | ORAL | Status: DC

## 2011-12-10 ENCOUNTER — Other Ambulatory Visit (INDEPENDENT_AMBULATORY_CARE_PROVIDER_SITE_OTHER): Payer: Self-pay

## 2011-12-10 DIAGNOSIS — E7800 Pure hypercholesterolemia, unspecified: Secondary | ICD-10-CM

## 2011-12-10 LAB — HEPATIC FUNCTION PANEL
ALT: 20 U/L (ref 0–35)
AST: 21 U/L (ref 0–37)
Alkaline Phosphatase: 79 U/L (ref 39–117)
Bilirubin, Direct: 0 mg/dL (ref 0.0–0.3)
Total Bilirubin: 0.9 mg/dL (ref 0.3–1.2)
Total Protein: 7.2 g/dL (ref 6.0–8.3)

## 2011-12-10 LAB — LIPID PANEL: Cholesterol: 190 mg/dL (ref 0–200)

## 2011-12-11 ENCOUNTER — Other Ambulatory Visit: Payer: Self-pay

## 2011-12-12 ENCOUNTER — Telehealth: Payer: Self-pay | Admitting: Cardiovascular Disease

## 2011-12-12 DIAGNOSIS — E785 Hyperlipidemia, unspecified: Secondary | ICD-10-CM

## 2011-12-12 MED ORDER — PRAVASTATIN SODIUM 40 MG PO TABS: 80.0000 mg | ORAL_TABLET | Freq: Every evening | ORAL | Status: DC

## 2011-12-12 MED ORDER — ATORVASTATIN CALCIUM 40 MG PO TABS: 40.0000 mg | ORAL_TABLET | Freq: Every day | ORAL | Status: DC

## 2011-12-12 NOTE — Telephone Encounter (Signed)
New problem    Patient returning call back to nurse.   

## 2011-12-12 NOTE — Addendum Note (Signed)
Addended by: Dossie Arbour on: 12/12/2011 12:20 PM   Modules accepted: Orders

## 2011-12-12 NOTE — Telephone Encounter (Signed)
Spoke with pt and reviewed lipid and liver results and instructions from Dr. Clifton Tancredi. She will make medication change to atorvastatin 40 mg daily and come in for fasting lab work on March 04, 2012.

## 2011-12-12 NOTE — Telephone Encounter (Signed)
Pt called back and is very concerned about making change to atorvastatin and lab work results. She would like to continue pravastatin and make dietary changes.  I offered pt dietary referral or referral to lipid clinic but she is unable to do these due to cost.  She will continue pravastatin 80 mg daily and return for fasting lipid and liver profile on March 04, 2012 after making dietary changes.  I will also send pt information on low cholesterol diet.

## 2012-03-04 ENCOUNTER — Other Ambulatory Visit (INDEPENDENT_AMBULATORY_CARE_PROVIDER_SITE_OTHER): Payer: Self-pay

## 2012-03-04 DIAGNOSIS — E785 Hyperlipidemia, unspecified: Secondary | ICD-10-CM

## 2012-03-04 LAB — HEPATIC FUNCTION PANEL
AST: 22 U/L (ref 0–37)
Albumin: 4 g/dL (ref 3.5–5.2)
Alkaline Phosphatase: 74 U/L (ref 39–117)
Bilirubin, Direct: 0 mg/dL (ref 0.0–0.3)
Total Bilirubin: 0.9 mg/dL (ref 0.3–1.2)

## 2012-03-04 LAB — LIPID PANEL
LDL Cholesterol: 83 mg/dL (ref 0–99)
Total CHOL/HDL Ratio: 3
VLDL: 31.4 mg/dL (ref 0.0–40.0)

## 2012-03-07 ENCOUNTER — Telehealth: Payer: Self-pay | Admitting: Cardiovascular Disease

## 2012-03-07 NOTE — Telephone Encounter (Signed)
Pt would like you to mail her a copy of her lab results

## 2012-03-07 NOTE — Telephone Encounter (Signed)
Spoke with pt and told her I would mail her a copy of labs

## 2012-04-08 ENCOUNTER — Telehealth: Payer: Self-pay | Admitting: Cardiovascular Disease

## 2012-04-08 DIAGNOSIS — E785 Hyperlipidemia, unspecified: Secondary | ICD-10-CM

## 2012-04-08 NOTE — Telephone Encounter (Signed)
New Problem:     Patient called in needing a refill of her pravastatin (PRAVACHOL) 40 MG tablet and carvedilol (COREG) 3.125 MG tablet.

## 2012-04-14 MED ORDER — CARVEDILOL 3.125 MG PO TABS: 3.1250 mg | ORAL_TABLET | Freq: Two times a day (BID) | ORAL | Status: DC

## 2012-04-14 MED ORDER — PRAVASTATIN SODIUM 40 MG PO TABS: 80.0000 mg | ORAL_TABLET | Freq: Every evening | ORAL | Status: DC

## 2012-06-26 ENCOUNTER — Telehealth: Payer: Self-pay | Admitting: Cardiovascular Disease

## 2012-06-26 MED ORDER — LOVASTATIN 20 MG PO TABS: 20.0000 mg | ORAL_TABLET | Freq: Every day | ORAL | Status: DC

## 2012-06-26 NOTE — Telephone Encounter (Signed)
New problem    Pravastatin 40 mg is not a $ 4.00 drug. The cost is  $ 30.00

## 2012-06-26 NOTE — Telephone Encounter (Signed)
We can change her to Lovastatin 20 mg po QHS. cdm

## 2012-06-26 NOTE — Telephone Encounter (Signed)
Spoke with pt and gave her instructions to change to Lovastatin 20 mg daily. Will send to [facility] in [place]

## 2012-06-26 NOTE — Telephone Encounter (Signed)
Spoke with pharmacy at [facility] and Pravastatin is no longer on $4.00 list. Only cholesterol medicine on $4.00 list is Lovastatin 10 mg and 20 mg.  I spoke with pt and she is unable to afford Pravastatin and is asking if she can be changed to a cheaper alternative

## 2012-10-30 ENCOUNTER — Other Ambulatory Visit: Payer: Self-pay | Admitting: *Deleted

## 2012-10-30 ENCOUNTER — Ambulatory Visit (INDEPENDENT_AMBULATORY_CARE_PROVIDER_SITE_OTHER): Payer: Self-pay | Admitting: Cardiovascular Disease

## 2012-10-30 ENCOUNTER — Encounter: Payer: Self-pay | Admitting: Cardiovascular Disease

## 2012-10-30 VITALS — BP 117/80 | HR 70 | Ht 64.0 in | Wt 143.0 lb

## 2012-10-30 DIAGNOSIS — E785 Hyperlipidemia, unspecified: Secondary | ICD-10-CM

## 2012-10-30 DIAGNOSIS — I2510 Atherosclerotic heart disease of native coronary artery without angina pectoris: Secondary | ICD-10-CM

## 2012-10-30 LAB — HEPATIC FUNCTION PANEL
AST: 17 U/L (ref 0–37)
Alkaline Phosphatase: 65 U/L (ref 39–117)
Bilirubin, Direct: 0 mg/dL (ref 0.0–0.3)
Total Bilirubin: 0.8 mg/dL (ref 0.3–1.2)

## 2012-10-30 LAB — LIPID PANEL
Cholesterol: 166 mg/dL (ref 0–200)
Triglycerides: 186 mg/dL — ABNORMAL HIGH (ref 0.0–149.0)

## 2012-10-30 MED ORDER — CARVEDILOL 3.125 MG PO TABS: 3.1250 mg | ORAL_TABLET | Freq: Two times a day (BID) | ORAL | Status: DC

## 2012-10-30 MED ORDER — LOVASTATIN 20 MG PO TABS: 20.0000 mg | ORAL_TABLET | Freq: Every day | ORAL | Status: DC

## 2012-10-30 MED ORDER — VARENICLINE TARTRATE 1 MG PO TABS
1.0000 mg | ORAL_TABLET | Freq: Two times a day (BID) | ORAL | Status: DC
Start: 2012-10-30 — End: 2013-10-29

## 2012-10-30 MED ORDER — VARENICLINE TARTRATE 0.5 MG X 11 & 1 MG X 42 PO MISC: ORAL | Status: DC

## 2012-10-30 NOTE — Patient Instructions (Signed)
Your physician wants you to follow-up in:  12 months.  You will receive a reminder letter in the mail two months in advance. If you don't receive a letter, please call our office to schedule the follow-up appointment.  Your physician has recommended you make the following change in your medication:  Use chantix as directed.

## 2012-10-30 NOTE — Progress Notes (Signed)
History of Present Illness: 62 yo WF with history of CAD, hyperlipidemia, tobacco abuse here today for cardiac follow up. She was admitted to [facility] April 13,2011 with NSTEMI and was found to have subtotally occluded distal RCA. An [employer] eluting stent was placed in the distal RCA. She was also found to have moderate disease in the mid LAD (50%), mid Circumflex (40%), proximal RCA (40%). She was last seen in our office June 2013.   She is here for follow up. She has not been having chest pain or SOB. She continues to smoke. She has had no near syncope, syncope or palpitations.   Primary Care Physician: John Giovanni  Last Lipid Profile:Lipid Panel     Component Value Date/Time   CHOL 166 03/04/2012 0838   TRIG 157.0* 03/04/2012 0838   HDL 51.60 03/04/2012 0838   CHOLHDL 3 03/04/2012 0838   VLDL 31.4 03/04/2012 0838   LDLCALC 83 03/04/2012 0838     Past Medical History  Diagnosis Date  . Coronary artery disease     NATIVE VESSEL (ICD-414.01)-cath 08/17/09 with subtotally occluded RCA, moderate disease LAD.  Marland Kitchen Hyperlipidemia   . Hypotension   . Tobacco user     Past Surgical History  Procedure Laterality Date  . Hysterectomy      laproscopic    Current Outpatient Prescriptions  Medication Sig Dispense Refill  . aspirin 81 MG tablet Take 81 mg by mouth daily.        . carvedilol (COREG) 3.125 MG tablet Take 1 tablet (3.125 mg total) by mouth 2 (two) times daily with a meal.  60 tablet  4  . lovastatin (MEVACOR) 20 MG tablet Take 1 tablet (20 mg total) by mouth at bedtime.  30 tablet  6  . nitroGLYCERIN (NITROSTAT) 0.4 MG SL tablet Place 1 tablet (0.4 mg total) under the tongue every 5 (five) minutes as needed.  25 tablet  4   No current facility-administered medications for this visit.    Allergies  Allergen Reactions  . Doxycycline     History   Social History  . Marital Status: Divorced    Spouse Name: N/A    Number of Children: N/A  .  Years of Education: N/A   Occupational History  . Not on file.   Social History Main Topics  . Smoking status: Current Every Day Smoker  . Smokeless tobacco: Never Used  . Alcohol Use: Yes     Comment: seldom  . Drug Use: No  . Sexually Active: Not on file   Other Topics Concern  . Not on file   Social History Narrative   The patient has a almost 1-pack per day tobacco use     history.     She occasionally has alcohol-no abuse.     No illicit drug use   Divorced, 2 children    Family History  Problem Relation Age of Onset  . Coronary artery disease    . Heart disease Father     bypass  . Heart disease Brother     bypass    Review of Systems:  As stated in the HPI and otherwise negative.   BP 117/80  Pulse 70  Ht 5\' 4"  (1.626 m)  Wt 143 lb (64.864 kg)  BMI 24.53 kg/m2  Physical Examination: General: Well developed, well nourished, NAD HEENT: OP clear, mucus membranes moist SKIN: warm, dry. No rashes. Neuro: No focal deficits Musculoskeletal: Muscle strength 5/5 all ext Psychiatric: Mood  and affect normal Neck: No JVD, no carotid bruits, no thyromegaly, no lymphadenopathy. Lungs:Clear bilaterally, no wheezes, rhonci, crackles Cardiovascular: Regular rate and rhythm. No murmurs, gallops or rubs. Abdomen:Soft. Bowel sounds present. Non-tender.  Extremities: No lower extremity edema. Pulses are 2 + in the bilateral DP/PT.  EKG: NSR, rate 70 bpm.   Assessment and Plan:   1. CAD: Stable. No chest pain or SOB. She is on an ASA, beta blocker and statin. Smoking cessation encouraged. Will check lipids and LFTs today.   2. Tobacco abuse: Smoking cessation encouraged. Counseling given. Will try Chantix for smoking cessation.

## 2013-10-29 ENCOUNTER — Ambulatory Visit (INDEPENDENT_AMBULATORY_CARE_PROVIDER_SITE_OTHER): Payer: Self-pay | Admitting: Cardiovascular Disease

## 2013-10-29 ENCOUNTER — Encounter: Payer: Self-pay | Admitting: Cardiovascular Disease

## 2013-10-29 VITALS — BP 110/60 | HR 63 | Ht 63.5 in | Wt 148.0 lb

## 2013-10-29 DIAGNOSIS — Z87891 Personal history of nicotine dependence: Secondary | ICD-10-CM

## 2013-10-29 DIAGNOSIS — I2510 Atherosclerotic heart disease of native coronary artery without angina pectoris: Secondary | ICD-10-CM

## 2013-10-29 DIAGNOSIS — F17201 Nicotine dependence, unspecified, in remission: Secondary | ICD-10-CM

## 2013-10-29 DIAGNOSIS — E785 Hyperlipidemia, unspecified: Secondary | ICD-10-CM

## 2013-10-29 LAB — LIPID PANEL
CHOL/HDL RATIO: 3
CHOLESTEROL: 200 mg/dL (ref 0–200)
HDL: 59.6 mg/dL (ref >39.00)
LDL Cholesterol: 72 mg/dL (ref 0–99)
NonHDL: 140.4
TRIGLYCERIDES: 343 mg/dL — AB (ref 0.0–149.0)
VLDL: 68.6 mg/dL — ABNORMAL HIGH (ref 0.0–40.0)

## 2013-10-29 LAB — HEPATIC FUNCTION PANEL
ALK PHOS: 82 U/L (ref 39–117)
ALT: 21 U/L (ref 0–35)
AST: 26 U/L (ref 0–37)
Albumin: 4.3 g/dL (ref 3.5–5.2)
BILIRUBIN DIRECT: 0.1 mg/dL (ref 0.0–0.3)
TOTAL PROTEIN: 8.2 g/dL (ref 6.0–8.3)
Total Bilirubin: 0.8 mg/dL (ref 0.2–1.2)

## 2013-10-29 NOTE — Patient Instructions (Signed)
Your physician wants you to follow-up in:  12 months.  You will receive a reminder letter in the mail two months in advance. If you don't receive a letter, please call our office to schedule the follow-up appointment.   

## 2013-10-29 NOTE — Progress Notes (Signed)
History of Present Illness: 63 yo WF with history of CAD, hyperlipidemia, tobacco abuse here today for cardiac follow up. She was admitted to [facility] April 13,2011 with NSTEMI and was found to have subtotally occluded distal RCA. An [employer] eluting stent was placed in the distal RCA. She was also found to have moderate disease in the mid LAD (50%), mid Circumflex (40%), proximal RCA (40%). She was last seen in our office June 2014.   She is here for follow up. She has not been having chest pain or SOB. She has stopped smoking (Feb 2015).  She has had no near syncope, syncope or palpitations.   Primary Care Physician: Leslie Andrea  Last Lipid Profile:Lipid Panel     Component Value Date/Time   CHOL 166 10/30/2012 0941   TRIG 186.0* 10/30/2012 0941   HDL 51.10 10/30/2012 0941   CHOLHDL 3 10/30/2012 0941   VLDL 37.2 10/30/2012 0941   LDLCALC 78 10/30/2012 0941    Past Medical History  Diagnosis Date  . Coronary artery disease     NATIVE VESSEL (ICD-414.01)-cath 08/17/09 with subtotally occluded RCA, moderate disease LAD.  Marland Kitchen Hyperlipidemia   . Hypotension   . Tobacco user     Past Surgical History  Procedure Laterality Date  . Hysterectomy      laproscopic    Current Outpatient Prescriptions  Medication Sig Dispense Refill  . aspirin 81 MG tablet Take 81 mg by mouth daily.        . carvedilol (COREG) 3.125 MG tablet Take 1 tablet (3.125 mg total) by mouth 2 (two) times daily with a meal.  60 tablet  11  . lovastatin (MEVACOR) 20 MG tablet Take 1 tablet (20 mg total) by mouth at bedtime.  30 tablet  11  . nitroGLYCERIN (NITROSTAT) 0.4 MG SL tablet Place 1 tablet (0.4 mg total) under the tongue every 5 (five) minutes as needed.  25 tablet  4   No current facility-administered medications for this visit.    Allergies  Allergen Reactions  . Doxycycline     History   Social History  . Marital Status: Divorced    Spouse Name: N/A    Number of Children:  N/A  . Years of Education: N/A   Occupational History  . Not on file.   Social History Main Topics  . Smoking status: Former Research scientist (life sciences)  . Smokeless tobacco: Former Systems developer    Quit date: 06/07/2013  . Alcohol Use: Yes     Comment: seldom  . Drug Use: No  . Sexual Activity: Not on file   Other Topics Concern  . Not on file   Social History Narrative   The patient has a almost 1-pack per day tobacco use     history.     She occasionally has alcohol-no abuse.     No illicit drug use   Divorced, 2 children    Family History  Problem Relation Age of Onset  . Coronary artery disease    . Heart disease Father     bypass  . Heart disease Brother     bypass    Review of Systems:  As stated in the HPI and otherwise negative.   BP 110/60  Pulse 63  Ht 5' 3.5" (1.613 m)  Wt 148 lb (67.132 kg)  BMI 25.80 kg/m2  Physical Examination: General: Well developed, well nourished, NAD HEENT: OP clear, mucus membranes moist SKIN: warm, dry. No rashes. Neuro: No focal deficits Musculoskeletal:  Muscle strength 5/5 all ext Psychiatric: Mood and affect normal Neck: No JVD, no carotid bruits, no thyromegaly, no lymphadenopathy. Lungs:Clear bilaterally, no wheezes, rhonci, crackles Cardiovascular: Regular rate and rhythm. No murmurs, gallops or rubs. Abdomen:Soft. Bowel sounds present. Non-tender.  Extremities: No lower extremity edema. Pulses are 2 + in the bilateral DP/PT.  EKG: NSR, rate 73 bpm.   Assessment and Plan:   1. CAD: Stable. No chest pain or SOB. She is on an ASA, beta blocker and statin.   2. Tobacco abuse, in remission: She stopped smoking in February 2015.   3. Hyperlipidemia: Continue statin. Will arrange lipids and LFTs today

## 2013-11-02 ENCOUNTER — Telehealth: Payer: Self-pay | Admitting: Cardiovascular Disease

## 2013-11-02 NOTE — Telephone Encounter (Signed)
The pt is advised and she verbalized understanding. 

## 2013-11-02 NOTE — Telephone Encounter (Signed)
Patient is returning call regarding blood work results. Please call and advise.

## 2013-11-27 ENCOUNTER — Other Ambulatory Visit: Payer: Self-pay | Admitting: Cardiovascular Disease

## 2013-11-27 ENCOUNTER — Other Ambulatory Visit: Payer: Self-pay

## 2013-11-27 DIAGNOSIS — E785 Hyperlipidemia, unspecified: Secondary | ICD-10-CM

## 2013-11-27 MED ORDER — CARVEDILOL 3.125 MG PO TABS: 3.1250 mg | ORAL_TABLET | Freq: Two times a day (BID) | ORAL | Status: DC

## 2013-11-27 MED ORDER — LOVASTATIN 20 MG PO TABS: 20.0000 mg | ORAL_TABLET | Freq: Every day | ORAL | Status: DC

## 2014-11-03 ENCOUNTER — Ambulatory Visit (INDEPENDENT_AMBULATORY_CARE_PROVIDER_SITE_OTHER): Payer: Self-pay | Admitting: Cardiovascular Disease

## 2014-11-03 ENCOUNTER — Encounter: Payer: Self-pay | Admitting: Cardiovascular Disease

## 2014-11-03 VITALS — BP 120/78 | HR 75 | Ht 64.0 in | Wt 157.0 lb

## 2014-11-03 DIAGNOSIS — I2510 Atherosclerotic heart disease of native coronary artery without angina pectoris: Secondary | ICD-10-CM

## 2014-11-03 DIAGNOSIS — F17201 Nicotine dependence, unspecified, in remission: Secondary | ICD-10-CM

## 2014-11-03 DIAGNOSIS — E785 Hyperlipidemia, unspecified: Secondary | ICD-10-CM

## 2014-11-03 LAB — HEPATIC FUNCTION PANEL
ALBUMIN: 4.4 g/dL (ref 3.5–5.2)
ALT: 20 U/L (ref 0–35)
AST: 22 U/L (ref 0–37)
Alkaline Phosphatase: 90 U/L (ref 39–117)
BILIRUBIN DIRECT: 0.1 mg/dL (ref 0.0–0.3)
Total Bilirubin: 0.9 mg/dL (ref 0.2–1.2)
Total Protein: 7.9 g/dL (ref 6.0–8.3)

## 2014-11-03 LAB — LIPID PANEL
CHOL/HDL RATIO: 4
Cholesterol: 189 mg/dL (ref 0–200)
HDL: 54 mg/dL (ref >39.00)
LDL Cholesterol: 98 mg/dL (ref 0–99)
NonHDL: 135
TRIGLYCERIDES: 183 mg/dL — AB (ref 0.0–149.0)
VLDL: 36.6 mg/dL (ref 0.0–40.0)

## 2014-11-03 NOTE — Patient Instructions (Signed)
Medication Instructions:  Your physician recommends that you continue on your current medications as directed. Please refer to the Current Medication list given to you today.   Labwork: Lab work to be done today--Lipid, Liver profiles  Testing/Procedures: none  Follow-Up: Your physician wants you to follow-up in: 12 months.  You will receive a reminder letter in the mail two months in advance. If you don't receive a letter, please call our office to schedule the follow-up appointment.

## 2014-11-03 NOTE — Progress Notes (Signed)
Chief Complaint  Patient presents with  . Follow-up     History of Present Illness: 64 yo WF with history of CAD, hyperlipidemia, tobacco abuse here today for cardiac follow up. She was admitted to [facility] April 13,2011 with NSTEMI and was found to have subtotally occluded distal RCA. An [employer] eluting stent was placed in the distal RCA. She was also found to have moderate disease in the mid LAD (50%), mid Circumflex (40%), proximal RCA (40%). She stopped smoking February 2015.   She is here for follow up. She describes a grabbing chest pressure occasionally, central chest and lasts for a few seconds. Associated dyspnea. No exertional chest pain or SOB.  She denies near syncope, syncope or palpitations.   Primary Care Physician: Leslie Andrea  Last Lipid Profile:Lipid Panel     Component Value Date/Time   CHOL 189 11/03/2014 0912   TRIG 183.0* 11/03/2014 0912   HDL 54.00 11/03/2014 0912   CHOLHDL 4 11/03/2014 0912   VLDL 36.6 11/03/2014 0912   LDLCALC 98 11/03/2014 0912    Past Medical History  Diagnosis Date  . Coronary artery disease     NATIVE VESSEL (ICD-414.01)-cath 08/17/09 with subtotally occluded RCA, moderate disease LAD.  Marland Kitchen Hyperlipidemia   . Hypotension   . Tobacco user     Past Surgical History  Procedure Laterality Date  . Hysterectomy      laproscopic    Current Outpatient Prescriptions  Medication Sig Dispense Refill  . aspirin 81 MG tablet Take 81 mg by mouth daily.      . carvedilol (COREG) 3.125 MG tablet Take 1 tablet (3.125 mg total) by mouth 2 (two) times daily with a meal. 60 tablet 11  . lovastatin (MEVACOR) 20 MG tablet TAKE ONE TABLET BY MOUTH AT BEDTIME 30 tablet 0  . nitroGLYCERIN (NITROSTAT) 0.4 MG SL tablet Place 1 tablet (0.4 mg total) under the tongue every 5 (five) minutes as needed. 25 tablet 4   No current facility-administered medications for this visit.    Allergies  Allergen Reactions  . Doxycycline      History   Social History  . Marital Status: Divorced    Spouse Name: N/A  . Number of Children: N/A  . Years of Education: N/A   Occupational History  . Not on file.   Social History Main Topics  . Smoking status: Former Research scientist (life sciences)  . Smokeless tobacco: Former Systems developer    Quit date: 06/07/2013  . Alcohol Use: Yes     Comment: seldom  . Drug Use: No  . Sexual Activity: Not on file   Other Topics Concern  . Not on file   Social History Narrative   The patient has a almost 1-pack per day tobacco use     history.     She occasionally has alcohol-no abuse.     No illicit drug use   Divorced, 2 children    Family History  Problem Relation Age of Onset  . Coronary artery disease    . Heart disease Father     bypass  . Heart disease Brother     bypass    Review of Systems:  As stated in the HPI and otherwise negative.   BP 120/78 mmHg  Pulse 75  Ht 5\' 4"  (1.626 m)  Wt 157 lb (71.215 kg)  BMI 26.94 kg/m2  Physical Examination: General: Well developed, well nourished, NAD HEENT: OP clear, mucus membranes moist SKIN: warm, dry. No rashes. Neuro: No  focal deficits Musculoskeletal: Muscle strength 5/5 all ext Psychiatric: Mood and affect normal Neck: No JVD, no carotid bruits, no thyromegaly, no lymphadenopathy. Lungs:Clear bilaterally, no wheezes, rhonci, crackles Cardiovascular: Regular rate and rhythm. No murmurs, gallops or rubs. Abdomen:Soft. Bowel sounds present. Non-tender.  Extremities: No lower extremity edema. Pulses are 2 + in the bilateral DP/PT.  EKG:  EKG is ordered today. The ekg ordered today demonstrates NSR, rate 75 bpm.   Recent Labs: 11/03/2014: ALT 20   Lipid Panel    Component Value Date/Time   CHOL 189 11/03/2014 0912   TRIG 183.0* 11/03/2014 0912   HDL 54.00 11/03/2014 0912   CHOLHDL 4 11/03/2014 0912   VLDL 36.6 11/03/2014 0912   LDLCALC 98 11/03/2014 0912   LDLDIRECT 113.7 10/15/2011 1105     Wt Readings from Last 3 Encounters:   11/03/14 157 lb (71.215 kg)  10/29/13 148 lb (67.132 kg)  10/30/12 143 lb (64.864 kg)     Other studies Reviewed: Additional studies/ records that were reviewed today include: . Review of the above records demonstrates:    Assessment and Plan:   1. CAD: Stable. No exertional chest pain or SOB. Her resting chest pain is atypical. She is on an ASA, beta blocker and statin. We discussed  An exercise stress test but she does not wish to have it at this time. She gets Medicare coverage in August.   2. Tobacco abuse, in remission: She stopped smoking in February 2015.   3. Hyperlipidemia: Continue statin. Will arrange lipids and LFTs today  Current medicines are reviewed at length with the patient today.  The patient does not have concerns regarding medicines.  The following changes have been made:  no change  Labs/ tests ordered today include:   Orders Placed This Encounter  Procedures  . Hepatic function panel  . Lipid Profile  . EKG 12-Lead    Disposition:   FU with me in 12  months  Signed, Lauree Chandler, MD 11/03/2014 3:06 PM    [facility] [address], [place], [place]  [address] Phone: [phone]; Fax: [phone]

## 2014-11-04 ENCOUNTER — Other Ambulatory Visit: Payer: Self-pay | Admitting: *Deleted

## 2014-11-04 DIAGNOSIS — E785 Hyperlipidemia, unspecified: Secondary | ICD-10-CM

## 2014-11-04 MED ORDER — LOVASTATIN 40 MG PO TABS: 40.0000 mg | ORAL_TABLET | Freq: Every day | ORAL | Status: DC

## 2014-12-31 ENCOUNTER — Other Ambulatory Visit: Payer: Self-pay | Admitting: Cardiovascular Disease

## 2015-01-24 ENCOUNTER — Other Ambulatory Visit (INDEPENDENT_AMBULATORY_CARE_PROVIDER_SITE_OTHER): Payer: Medicare Other | Admitting: *Deleted

## 2015-01-24 DIAGNOSIS — E785 Hyperlipidemia, unspecified: Secondary | ICD-10-CM

## 2015-01-24 LAB — HEPATIC FUNCTION PANEL
ALBUMIN: 3.8 g/dL (ref 3.5–5.2)
ALT: 17 U/L (ref 0–35)
AST: 19 U/L (ref 0–37)
Alkaline Phosphatase: 80 U/L (ref 39–117)
BILIRUBIN DIRECT: 0.2 mg/dL (ref 0.0–0.3)
Total Bilirubin: 0.8 mg/dL (ref 0.2–1.2)
Total Protein: 7 g/dL (ref 6.0–8.3)

## 2015-01-24 LAB — LIPID PANEL
CHOL/HDL RATIO: 3
Cholesterol: 151 mg/dL (ref 0–200)
HDL: 50.1 mg/dL (ref >39.00)
LDL Cholesterol: 67 mg/dL (ref 0–99)
NONHDL: 100.82
Triglycerides: 167 mg/dL — ABNORMAL HIGH (ref 0.0–149.0)
VLDL: 33.4 mg/dL (ref 0.0–40.0)

## 2015-05-31 DIAGNOSIS — L03115 Cellulitis of right lower limb: Secondary | ICD-10-CM | POA: Diagnosis not present

## 2015-06-08 DIAGNOSIS — L03115 Cellulitis of right lower limb: Secondary | ICD-10-CM | POA: Diagnosis not present

## 2015-10-28 ENCOUNTER — Encounter: Payer: Self-pay | Admitting: Cardiovascular Disease

## 2015-11-03 ENCOUNTER — Telehealth: Payer: Self-pay | Admitting: Cardiovascular Disease

## 2015-11-03 NOTE — Telephone Encounter (Signed)
New message   Pt canceled per bump list she can only do Monday Tues or Fri

## 2015-11-03 NOTE — Telephone Encounter (Signed)
Schedule patient on the next available appointment that works with her schedule. Patient will have to see Dr. Angelena Form in September. Patient does not have any questions or concerns at this time. Patient is agreeable to appointment in September and thanked me for helping.

## 2015-11-28 ENCOUNTER — Ambulatory Visit: Payer: Medicare Other | Admitting: Cardiovascular Disease

## 2015-12-05 ENCOUNTER — Other Ambulatory Visit: Payer: Self-pay | Admitting: Cardiovascular Disease

## 2015-12-06 ENCOUNTER — Other Ambulatory Visit: Payer: Self-pay | Admitting: Cardiovascular Disease

## 2015-12-06 MED ORDER — LOVASTATIN 40 MG PO TABS: 40.0000 mg | ORAL_TABLET | Freq: Every day | ORAL | 0 refills | Status: DC

## 2015-12-06 MED ORDER — LOVASTATIN 20 MG PO TABS: 20.0000 mg | ORAL_TABLET | Freq: Every day | ORAL | 0 refills | Status: DC

## 2015-12-26 ENCOUNTER — Encounter (INDEPENDENT_AMBULATORY_CARE_PROVIDER_SITE_OTHER): Payer: Self-pay

## 2015-12-26 ENCOUNTER — Encounter: Payer: Self-pay | Admitting: *Deleted

## 2015-12-26 ENCOUNTER — Ambulatory Visit (INDEPENDENT_AMBULATORY_CARE_PROVIDER_SITE_OTHER): Payer: PPO | Admitting: Cardiovascular Disease

## 2015-12-26 VITALS — BP 128/80 | HR 69 | Ht 63.0 in | Wt 136.0 lb

## 2015-12-26 DIAGNOSIS — E785 Hyperlipidemia, unspecified: Secondary | ICD-10-CM

## 2015-12-26 DIAGNOSIS — I25110 Atherosclerotic heart disease of native coronary artery with unstable angina pectoris: Secondary | ICD-10-CM

## 2015-12-26 LAB — CBC WITH DIFFERENTIAL/PLATELET
BASOS PCT: 2 %
Basophils Absolute: 190 cells/uL (ref 0–200)
EOS ABS: 570 {cells}/uL — AB (ref 15–500)
Eosinophils Relative: 6 %
HCT: 39.1 % (ref 35.0–45.0)
HEMOGLOBIN: 12.2 g/dL (ref 11.7–15.5)
Lymphocytes Relative: 32 %
Lymphs Abs: 3040 cells/uL (ref 850–3900)
MCH: 30.4 pg (ref 27.0–33.0)
MCHC: 31.2 g/dL — ABNORMAL LOW (ref 32.0–36.0)
MCV: 97.5 fL (ref 80.0–100.0)
MONOS PCT: 6 %
MPV: 10 fL (ref 7.5–12.5)
Monocytes Absolute: 570 cells/uL (ref 200–950)
NEUTROS ABS: 5130 {cells}/uL (ref 1500–7800)
Neutrophils Relative %: 54 %
PLATELETS: 288 10*3/uL (ref 140–400)
RBC: 4.01 MIL/uL (ref 3.80–5.10)
RDW: 13.4 % (ref 11.0–15.0)
WBC: 9.5 10*3/uL (ref 3.8–10.8)

## 2015-12-26 LAB — COMPREHENSIVE METABOLIC PANEL
ALBUMIN: 4.3 g/dL (ref 3.6–5.1)
ALT: 13 U/L (ref 6–29)
AST: 22 U/L (ref 10–35)
Alkaline Phosphatase: 69 U/L (ref 33–130)
BUN: 17 mg/dL (ref 7–25)
CALCIUM: 9.4 mg/dL (ref 8.6–10.4)
CHLORIDE: 108 mmol/L (ref 98–110)
CO2: 21 mmol/L (ref 20–31)
Creat: 1.06 mg/dL — ABNORMAL HIGH (ref 0.50–0.99)
Glucose, Bld: 93 mg/dL (ref 65–99)
Potassium: 4.3 mmol/L (ref 3.5–5.3)
Sodium: 142 mmol/L (ref 135–146)
Total Bilirubin: 0.7 mg/dL (ref 0.2–1.2)
Total Protein: 7.3 g/dL (ref 6.1–8.1)

## 2015-12-26 LAB — LIPID PANEL
CHOL/HDL RATIO: 3.1 ratio (ref <=5.0)
CHOLESTEROL: 182 mg/dL (ref 125–200)
HDL: 59 mg/dL (ref >=46)
LDL Cholesterol: 88 mg/dL (ref <130)
Triglycerides: 177 mg/dL — ABNORMAL HIGH (ref <150)
VLDL: 35 mg/dL — ABNORMAL HIGH (ref <30)

## 2015-12-26 LAB — PROTIME-INR
INR: 1
Prothrombin Time: 11 s (ref 9.0–11.5)

## 2015-12-26 NOTE — Patient Instructions (Signed)
Medication Instructions:  Your physician recommends that you continue on your current medications as directed. Please refer to the Current Medication list given to you today.   Labwork: Lab work to be done today--CMET, CBC, PT, Lipid profile  Testing/Procedures: Your physician has requested that you have a cardiac catheterization. Cardiac catheterization is used to diagnose and/or treat various heart conditions. Doctors may recommend this procedure for a number of different reasons. The most common reason is to evaluate chest pain. Chest pain can be a symptom of coronary artery disease (CAD), and cardiac catheterization can show whether plaque is narrowing or blocking your heart's arteries. This procedure is also used to evaluate the valves, as well as measure the blood flow and oxygen levels in different parts of your heart. For further information please visit [email or web address]. Please follow instruction sheet, as given. Scheduled for August 25,2017    Follow-Up: Your physician recommends that you schedule a follow-up appointment in: one month with PA or NP.    Any Other Special Instructions Will Be Listed Below (If Applicable).     If you need a refill on your cardiac medications before your next appointment, please call your pharmacy.

## 2015-12-26 NOTE — Progress Notes (Signed)
Chief Complaint  Patient presents with  . Chest Pain     History of Present Illness: 65 yo WF with history of CAD, hyperlipidemia, tobacco abuse here today for cardiac follow up. She was admitted to [facility] April 13,2011 with NSTEMI and was found to have subtotally occluded distal RCA. An Endeavor Drug eluting stent was placed in the distal RCA. She was also found to have moderate disease in the mid LAD (50%), mid Circumflex (40%), proximal RCA (40%). She stopped smoking February 2015.   She is here for follow up. She has had progression of the pain in her left scapula wihich was her anginal equivalent. No dyspnea. No near syncope, syncope or palpitations. The paiin in her left shoulder and left scapula is occurring every day.   Primary Care Physician: Robert Bellow, MD   Past Medical History:  Diagnosis Date  . Coronary artery disease    NATIVE VESSEL (ICD-414.01)-cath 08/17/09 with subtotally occluded RCA, moderate disease LAD.  Marland Kitchen Hyperlipidemia   . Hypotension   . Tobacco user     Past Surgical History:  Procedure Laterality Date  . hysterectomy     laproscopic    Current Outpatient Prescriptions  Medication Sig Dispense Refill  . aspirin 81 MG tablet Take 81 mg by mouth daily.      . carvedilol (COREG) 3.125 MG tablet TAKE ONE TABLET BY MOUTH TWICE DAILY WITH  MEALS 60 tablet 10  . lovastatin (MEVACOR) 20 MG tablet Take 1 tablet (20 mg total) by mouth at bedtime. 30 tablet 0  . nitroGLYCERIN (NITROSTAT) 0.4 MG SL tablet Place 1 tablet (0.4 mg total) under the tongue every 5 (five) minutes as needed. 25 tablet 4   No current facility-administered medications for this visit.     Allergies  Allergen Reactions  . Doxycycline     Social History   Social History  . Marital status: Divorced    Spouse name: N/A  . Number of children: N/A  . Years of education: N/A   Occupational History  . Not on file.   Social History Main Topics  . Smoking  status: Former Research scientist (life sciences)  . Smokeless tobacco: Former Systems developer    Quit date: 06/07/2013  . Alcohol use Yes     Comment: seldom  . Drug use: No  . Sexual activity: Not on file   Other Topics Concern  . Not on file   Social History Narrative   The patient has a almost 1-pack per day tobacco use     history.     She occasionally has alcohol-no abuse.     No illicit drug use   Divorced, 2 children    Family History  Problem Relation Age of Onset  . Coronary artery disease    . Heart disease Father     bypass  . Heart disease Brother     bypass    Review of Systems:  As stated in the HPI and otherwise negative.   BP 128/80 (BP Location: Right Arm, Patient Position: Sitting, Cuff Size: Normal)   Pulse 69   Ht '5\' 3"'$  (1.6 m)   Wt 136 lb (61.7 kg)   BMI 24.09 kg/m   Physical Examination: General: Well developed, well nourished, NAD  HEENT: OP clear, mucus membranes moist  SKIN: warm, dry. No rashes. Neuro: No focal deficits  Musculoskeletal: Muscle strength 5/5 all ext  Psychiatric: Mood and affect normal  Neck: No JVD, no carotid bruits, no thyromegaly, no lymphadenopathy.  Lungs:Clear bilaterally, no wheezes, rhonci, crackles Cardiovascular: Regular rate and rhythm. No murmurs, gallops or rubs. Abdomen:Soft. Bowel sounds present. Non-tender.  Extremities: No lower extremity edema. Pulses are 2 + in the bilateral DP/PT.  EKG:  EKG is  ordered today. The ekg ordered today demonstrates sinus, rate 69 bpm. T wave flattening diffusely, unchanged.   Recent Labs: 01/24/2015: ALT 17   Lipid Panel    Component Value Date/Time   CHOL 151 01/24/2015 0935   TRIG 167.0 (H) 01/24/2015 0935   HDL 50.10 01/24/2015 0935   CHOLHDL 3 01/24/2015 0935   VLDL 33.4 01/24/2015 0935   LDLCALC 67 01/24/2015 0935   LDLDIRECT 113.7 10/15/2011 1105     Wt Readings from Last 3 Encounters:  12/26/15 136 lb (61.7 kg)  11/03/14 157 lb (71.2 kg)  10/29/13 148 lb (67.1 kg)     Other studies  Reviewed: Additional studies/ records that were reviewed today include: . Review of the above records demonstrates:    Assessment and Plan:   1. CAD: She has worsening of left shoulder pain/scapular pain which was her anginal equivalent. She is on an ASA, beta blocker and statin. I think this could represent progression of her CAD. Will plan cardiac cath at [facility] 12/30/15 at 9am. Risks and benefits of procedure reviewed with pt. She agrees to proceed. Will get pre-cath labs this am.    2. Tobacco abuse, in remission: She stopped smoking in February 2015.   3. Hyperlipidemia: Continue statin. Will arrange lipids and LFTs today  Current medicines are reviewed at length with the patient today.  The patient does not have concerns regarding medicines.  The following changes have been made:  no change  Labs/ tests ordered today include:   Orders Placed This Encounter  Procedures  . Lipid Profile  . Comp Met (CMET)  . CBC w/Diff  . INR/PT  . EKG 12-Lead    Disposition:   FU with me after cath.   Signed, Lauree Chandler, MD 12/26/2015 9:03 AM    [facility] [place], [place], [place]  [address] Phone: [phone]; Fax: [phone]

## 2015-12-27 ENCOUNTER — Telehealth: Payer: Self-pay | Admitting: Cardiovascular Disease

## 2015-12-27 DIAGNOSIS — E785 Hyperlipidemia, unspecified: Secondary | ICD-10-CM

## 2015-12-27 MED ORDER — LOVASTATIN 40 MG PO TABS: 40.0000 mg | ORAL_TABLET | Freq: Every day | ORAL | 3 refills | Status: DC

## 2015-12-27 NOTE — Telephone Encounter (Signed)
I spoke with pt and reviewed lab results and recommendations from Dr. Angelena Form with her. Will send prescription for increased dose of Mevacor to [facility] in [place].  She will come in for fasting lab work on 03/19/16

## 2015-12-27 NOTE — Telephone Encounter (Signed)
New message ° ° ° °Pt verbalized that she is returning call for rn  °

## 2015-12-30 ENCOUNTER — Ambulatory Visit (HOSPITAL_COMMUNITY)
Admission: RE | Admit: 2015-12-30 | Discharge: 2015-12-31 | Disposition: A | Discharge status: Home / Self Care | Payer: PPO | Source: Ambulatory Visit | Attending: Cardiovascular Disease | Admitting: Cardiovascular Disease

## 2015-12-30 ENCOUNTER — Encounter (HOSPITAL_COMMUNITY): Payer: Self-pay | Admitting: *Deleted

## 2015-12-30 ENCOUNTER — Ambulatory Visit (HOSPITAL_COMMUNITY)
Admission: RE | Disposition: A | Discharge status: Home / Self Care | Payer: Self-pay | Source: Ambulatory Visit | Attending: Cardiovascular Disease

## 2015-12-30 DIAGNOSIS — Z87891 Personal history of nicotine dependence: Secondary | ICD-10-CM | POA: Diagnosis not present

## 2015-12-30 DIAGNOSIS — I252 Old myocardial infarction: Secondary | ICD-10-CM | POA: Insufficient documentation

## 2015-12-30 DIAGNOSIS — I200 Unstable angina: Secondary | ICD-10-CM | POA: Diagnosis present

## 2015-12-30 DIAGNOSIS — Z7982 Long term (current) use of aspirin: Secondary | ICD-10-CM | POA: Diagnosis not present

## 2015-12-30 DIAGNOSIS — Z79899 Other long term (current) drug therapy: Secondary | ICD-10-CM | POA: Diagnosis not present

## 2015-12-30 DIAGNOSIS — E785 Hyperlipidemia, unspecified: Secondary | ICD-10-CM | POA: Insufficient documentation

## 2015-12-30 DIAGNOSIS — Z955 Presence of coronary angioplasty implant and graft: Secondary | ICD-10-CM | POA: Insufficient documentation

## 2015-12-30 DIAGNOSIS — I25110 Atherosclerotic heart disease of native coronary artery with unstable angina pectoris: Secondary | ICD-10-CM | POA: Diagnosis not present

## 2015-12-30 HISTORY — DX: Acute myocardial infarction, unspecified: I21.9

## 2015-12-30 HISTORY — PX: CARDIAC CATHETERIZATION: SHX172

## 2015-12-30 LAB — POCT ACTIVATED CLOTTING TIME: ACTIVATED CLOTTING TIME: 362 s

## 2015-12-30 LAB — TROPONIN I
TROPONIN I: 0.04 ng/mL — AB (ref <0.03)
Troponin I: 0.03 ng/mL (ref <0.03)

## 2015-12-30 SURGERY — LEFT HEART CATH AND CORONARY ANGIOGRAPHY
Anesthesia: LOCAL

## 2015-12-30 MED ORDER — IOPAMIDOL (ISOVUE-370) INJECTION 76%
INTRAVENOUS | Status: AC
  Filled 2015-12-30: qty 100

## 2015-12-30 MED ORDER — SODIUM CHLORIDE 0.9 % IV SOLN: 250.0000 mL | INTRAVENOUS | Status: DC | PRN

## 2015-12-30 MED ORDER — HEPARIN SODIUM (PORCINE) 1000 UNIT/ML IJ SOLN
INTRAMUSCULAR | Status: DC | PRN
  Administered 2015-12-30: 3000 [IU] via INTRAVENOUS
  Administered 2015-12-30: 7000 [IU] via INTRAVENOUS

## 2015-12-30 MED ORDER — ALUM & MAG HYDROXIDE-SIMETH 200-200-20 MG/5ML PO SUSP
30.0000 mL | ORAL | Status: DC | PRN
  Administered 2015-12-30: 11:00:00 30 mL via ORAL
  Filled 2015-12-30: qty 30

## 2015-12-30 MED ORDER — VERAPAMIL HCL 2.5 MG/ML IV SOLN
INTRAVENOUS | Status: AC
  Filled 2015-12-30: qty 2

## 2015-12-30 MED ORDER — SODIUM CHLORIDE 0.9% FLUSH
3.0000 mL | Freq: Two times a day (BID) | INTRAVENOUS | Status: DC
  Administered 2015-12-30: 12:00:00 3 mL via INTRAVENOUS

## 2015-12-30 MED ORDER — NITROGLYCERIN 0.4 MG SL SUBL
0.4000 mg | SUBLINGUAL_TABLET | SUBLINGUAL | Status: DC | PRN
  Administered 2015-12-30: 0.4 mg via SUBLINGUAL
  Filled 2015-12-30: qty 1

## 2015-12-30 MED ORDER — SODIUM CHLORIDE 0.9% FLUSH: 3.0000 mL | INTRAVENOUS | Status: DC | PRN

## 2015-12-30 MED ORDER — ASPIRIN EC 81 MG PO TBEC: 81.0000 mg | DELAYED_RELEASE_TABLET | Freq: Every day | ORAL | Status: DC

## 2015-12-30 MED ORDER — MIDAZOLAM HCL 2 MG/2ML IJ SOLN
INTRAMUSCULAR | Status: AC
  Filled 2015-12-30: qty 2

## 2015-12-30 MED ORDER — ANGIOPLASTY BOOK
Freq: Once | Status: AC
  Administered 2015-12-30: 21:00:00
  Filled 2015-12-30: qty 1

## 2015-12-30 MED ORDER — CARVEDILOL 3.125 MG PO TABS
3.1250 mg | ORAL_TABLET | Freq: Two times a day (BID) | ORAL | Status: DC
  Administered 2015-12-30 – 2015-12-31 (×2): 3.125 mg via ORAL
  Filled 2015-12-30 (×2): qty 1

## 2015-12-30 MED ORDER — TICAGRELOR 90 MG PO TABS
ORAL_TABLET | ORAL | Status: AC
  Filled 2015-12-30: qty 2

## 2015-12-30 MED ORDER — LIDOCAINE HCL (PF) 1 % IJ SOLN
INTRAMUSCULAR | Status: AC
  Filled 2015-12-30: qty 30

## 2015-12-30 MED ORDER — SODIUM CHLORIDE 0.9 % IV SOLN: INTRAVENOUS | Status: AC

## 2015-12-30 MED ORDER — ONDANSETRON HCL 4 MG/2ML IJ SOLN: 4.0000 mg | Freq: Four times a day (QID) | INTRAMUSCULAR | Status: DC | PRN

## 2015-12-30 MED ORDER — IOPAMIDOL (ISOVUE-370) INJECTION 76%
INTRAVENOUS | Status: AC
  Filled 2015-12-30: qty 50

## 2015-12-30 MED ORDER — FENTANYL CITRATE (PF) 100 MCG/2ML IJ SOLN
INTRAMUSCULAR | Status: DC | PRN
  Administered 2015-12-30 (×2): 25 ug via INTRAVENOUS
  Administered 2015-12-30: 50 ug via INTRAVENOUS

## 2015-12-30 MED ORDER — ASPIRIN 81 MG PO CHEW: 81.0000 mg | CHEWABLE_TABLET | ORAL | Status: DC

## 2015-12-30 MED ORDER — HEPARIN SODIUM (PORCINE) 1000 UNIT/ML IJ SOLN
INTRAMUSCULAR | Status: AC
  Filled 2015-12-30: qty 1

## 2015-12-30 MED ORDER — MIDAZOLAM HCL 2 MG/2ML IJ SOLN
INTRAMUSCULAR | Status: DC | PRN
  Administered 2015-12-30: 1 mg via INTRAVENOUS
  Administered 2015-12-30: 2 mg via INTRAVENOUS
  Administered 2015-12-30: 1 mg via INTRAVENOUS

## 2015-12-30 MED ORDER — HEPARIN (PORCINE) IN NACL 2-0.9 UNIT/ML-% IJ SOLN
INTRAMUSCULAR | Status: DC | PRN
  Administered 2015-12-30: 1000 mL via INTRA_ARTERIAL

## 2015-12-30 MED ORDER — TICAGRELOR 90 MG PO TABS
90.0000 mg | ORAL_TABLET | Freq: Two times a day (BID) | ORAL | Status: DC
  Administered 2015-12-30: 22:00:00 90 mg via ORAL
  Filled 2015-12-30: qty 1

## 2015-12-30 MED ORDER — TICAGRELOR 90 MG PO TABS
ORAL_TABLET | ORAL | Status: DC | PRN
  Administered 2015-12-30: 180 mg via ORAL

## 2015-12-30 MED ORDER — ATORVASTATIN CALCIUM 40 MG PO TABS
40.0000 mg | ORAL_TABLET | Freq: Every day | ORAL | Status: DC
  Administered 2015-12-30: 40 mg via ORAL
  Filled 2015-12-30: qty 1

## 2015-12-30 MED ORDER — SODIUM CHLORIDE 0.9 % IV SOLN
INTRAVENOUS | Status: DC
  Administered 2015-12-30: 07:00:00 via INTRAVENOUS

## 2015-12-30 MED ORDER — ACETAMINOPHEN 325 MG PO TABS: 650.0000 mg | ORAL_TABLET | ORAL | Status: DC | PRN

## 2015-12-30 MED ORDER — LIDOCAINE HCL (PF) 1 % IJ SOLN
INTRAMUSCULAR | Status: DC | PRN
  Administered 2015-12-30: 3 mL

## 2015-12-30 MED ORDER — MORPHINE SULFATE (PF) 2 MG/ML IV SOLN: 2.0000 mg | INTRAVENOUS | Status: DC | PRN

## 2015-12-30 MED ORDER — FENTANYL CITRATE (PF) 100 MCG/2ML IJ SOLN
INTRAMUSCULAR | Status: AC
  Filled 2015-12-30: qty 2

## 2015-12-30 MED ORDER — OXYCODONE-ACETAMINOPHEN 5-325 MG PO TABS: 1.0000 | ORAL_TABLET | ORAL | Status: DC | PRN

## 2015-12-30 MED ORDER — SODIUM CHLORIDE 0.9% FLUSH: 3.0000 mL | Freq: Two times a day (BID) | INTRAVENOUS | Status: DC

## 2015-12-30 MED ORDER — NITROGLYCERIN 1 MG/10 ML FOR IR/CATH LAB
INTRA_ARTERIAL | Status: AC
  Filled 2015-12-30: qty 10

## 2015-12-30 MED ORDER — HEPARIN (PORCINE) IN NACL 2-0.9 UNIT/ML-% IJ SOLN
INTRAMUSCULAR | Status: AC
Start: 2015-12-30 — End: 2015-12-30
  Filled 2015-12-30: qty 1000

## 2015-12-30 MED ORDER — NITROGLYCERIN 1 MG/10 ML FOR IR/CATH LAB
INTRA_ARTERIAL | Status: DC | PRN
  Administered 2015-12-30: 200 ug via INTRACORONARY

## 2015-12-30 MED ORDER — IOPAMIDOL (ISOVUE-370) INJECTION 76%
INTRAVENOUS | Status: DC | PRN
  Administered 2015-12-30: 125 mL via INTRA_ARTERIAL

## 2015-12-30 SURGICAL SUPPLY — 19 items
BALLN EMERGE MR 2.0X15 (BALLOONS) ×2
BALLN ~~LOC~~ EUPHORA RX 2.75X15 (BALLOONS) ×2
BALLOON EMERGE MR 2.0X15 (BALLOONS) ×1 IMPLANT
BALLOON ~~LOC~~ EUPHORA RX 2.75X15 (BALLOONS) ×1 IMPLANT
CATH INFINITI 5 FR JL3.5 (CATHETERS) ×2 IMPLANT
CATH INFINITI 5FR ANG PIGTAIL (CATHETERS) ×2 IMPLANT
CATH INFINITI JR4 5F (CATHETERS) ×2 IMPLANT
CATH VISTA GUIDE 6FR JR4 (CATHETERS) ×2 IMPLANT
GLIDESHEATH SLEND SS 6F .021 (SHEATH) ×2 IMPLANT
KIT ENCORE 26 ADVANTAGE (KITS) ×4 IMPLANT
KIT HEART LEFT (KITS) ×2 IMPLANT
PACK CARDIAC CATHETERIZATION (CUSTOM PROCEDURE TRAY) ×2 IMPLANT
STENT RESOLUTE INTEG 2.5X22 (Permanent Stent) ×2 IMPLANT
SYR MEDRAD MARK V 150ML (SYRINGE) ×2 IMPLANT
TRANSDUCER W/STOPCOCK (MISCELLANEOUS) ×2 IMPLANT
TUBING CIL FLEX 10 FLL-RA (TUBING) ×2 IMPLANT
VALVE GUARDIAN II ~~LOC~~ HEMO (MISCELLANEOUS) ×2 IMPLANT
WIRE COUGAR XT STRL 190CM (WIRE) ×2 IMPLANT
WIRE SAFE-T 1.5MM-J .035X260CM (WIRE) ×2 IMPLANT

## 2015-12-30 NOTE — Interval H&P Note (Signed)
History and Physical Interval Note:  12/30/2015 7:34 AM  Angel Rivera  has presented today for cardiac cath with the diagnosis of unstable angina.  The various methods of treatment have been discussed with the patient and family. After consideration of risks, benefits and other options for treatment, the patient has consented to  Procedure(s): Left Heart Cath and Coronary Angiography (N/A) as a surgical intervention .  The patient's history has been reviewed, patient examined, no change in status, stable for surgery.  I have reviewed the patient's chart and labs.  Questions were answered to the patient's satisfaction.    Cath Lab Visit (complete for each Cath Lab visit)  Clinical Evaluation Leading to the Procedure:   ACS: No.  Non-ACS:    Anginal Classification: CCS III  Anti-ischemic medical therapy: Minimal Therapy (1 class of medications)  Non-Invasive Test Results: No non-invasive testing performed  Prior CABG: No previous CABG         Lauree Chandler

## 2015-12-30 NOTE — H&P (View-Only) (Signed)
Chief Complaint  Patient presents with  . Chest Pain     History of Present Illness: 65 yo WF with history of CAD, hyperlipidemia, tobacco abuse here today for cardiac follow up. She was admitted to [facility] April 13,2011 with NSTEMI and was found to have subtotally occluded distal RCA. An Endeavor Drug eluting stent was placed in the distal RCA. She was also found to have moderate disease in the mid LAD (50%), mid Circumflex (40%), proximal RCA (40%). She stopped smoking February 2015.   She is here for follow up. She has had progression of the pain in her left scapula wihich was her anginal equivalent. No dyspnea. No near syncope, syncope or palpitations. The paiin in her left shoulder and left scapula is occurring every day.   Primary Care Physician: Robert Bellow, MD   Past Medical History:  Diagnosis Date  . Coronary artery disease    NATIVE VESSEL (ICD-414.01)-cath 08/17/09 with subtotally occluded RCA, moderate disease LAD.  Marland Kitchen Hyperlipidemia   . Hypotension   . Tobacco user     Past Surgical History:  Procedure Laterality Date  . hysterectomy     laproscopic    Current Outpatient Prescriptions  Medication Sig Dispense Refill  . aspirin 81 MG tablet Take 81 mg by mouth daily.      . carvedilol (COREG) 3.125 MG tablet TAKE ONE TABLET BY MOUTH TWICE DAILY WITH  MEALS 60 tablet 10  . lovastatin (MEVACOR) 20 MG tablet Take 1 tablet (20 mg total) by mouth at bedtime. 30 tablet 0  . nitroGLYCERIN (NITROSTAT) 0.4 MG SL tablet Place 1 tablet (0.4 mg total) under the tongue every 5 (five) minutes as needed. 25 tablet 4   No current facility-administered medications for this visit.     Allergies  Allergen Reactions  . Doxycycline     Social History   Social History  . Marital status: Divorced    Spouse name: N/A  . Number of children: N/A  . Years of education: N/A   Occupational History  . Not on file.   Social History Main Topics  . Smoking  status: Former Research scientist (life sciences)  . Smokeless tobacco: Former Systems developer    Quit date: 06/07/2013  . Alcohol use Yes     Comment: seldom  . Drug use: No  . Sexual activity: Not on file   Other Topics Concern  . Not on file   Social History Narrative   The patient has a almost 1-pack per day tobacco use     history.     She occasionally has alcohol-no abuse.     No illicit drug use   Divorced, 2 children    Family History  Problem Relation Age of Onset  . Coronary artery disease    . Heart disease Father     bypass  . Heart disease Brother     bypass    Review of Systems:  As stated in the HPI and otherwise negative.   BP 128/80 (BP Location: Right Arm, Patient Position: Sitting, Cuff Size: Normal)   Pulse 69   Ht '5\' 3"'$  (1.6 m)   Wt 136 lb (61.7 kg)   BMI 24.09 kg/m   Physical Examination: General: Well developed, well nourished, NAD  HEENT: OP clear, mucus membranes moist  SKIN: warm, dry. No rashes. Neuro: No focal deficits  Musculoskeletal: Muscle strength 5/5 all ext  Psychiatric: Mood and affect normal  Neck: No JVD, no carotid bruits, no thyromegaly, no lymphadenopathy.  Lungs:Clear bilaterally, no wheezes, rhonci, crackles Cardiovascular: Regular rate and rhythm. No murmurs, gallops or rubs. Abdomen:Soft. Bowel sounds present. Non-tender.  Extremities: No lower extremity edema. Pulses are 2 + in the bilateral DP/PT.  EKG:  EKG is  ordered today. The ekg ordered today demonstrates sinus, rate 69 bpm. T wave flattening diffusely, unchanged.   Recent Labs: 01/24/2015: ALT 17   Lipid Panel    Component Value Date/Time   CHOL 151 01/24/2015 0935   TRIG 167.0 (H) 01/24/2015 0935   HDL 50.10 01/24/2015 0935   CHOLHDL 3 01/24/2015 0935   VLDL 33.4 01/24/2015 0935   LDLCALC 67 01/24/2015 0935   LDLDIRECT 113.7 10/15/2011 1105     Wt Readings from Last 3 Encounters:  12/26/15 136 lb (61.7 kg)  11/03/14 157 lb (71.2 kg)  10/29/13 148 lb (67.1 kg)     Other studies  Reviewed: Additional studies/ records that were reviewed today include: . Review of the above records demonstrates:    Assessment and Plan:   1. CAD: She has worsening of left shoulder pain/scapular pain which was her anginal equivalent. She is on an ASA, beta blocker and statin. I think this could represent progression of her CAD. Will plan cardiac cath at [facility] 12/30/15 at 9am. Risks and benefits of procedure reviewed with pt. She agrees to proceed. Will get pre-cath labs this am.    2. Tobacco abuse, in remission: She stopped smoking in February 2015.   3. Hyperlipidemia: Continue statin. Will arrange lipids and LFTs today  Current medicines are reviewed at length with the patient today.  The patient does not have concerns regarding medicines.  The following changes have been made:  no change  Labs/ tests ordered today include:   Orders Placed This Encounter  Procedures  . Lipid Profile  . Comp Met (CMET)  . CBC w/Diff  . INR/PT  . EKG 12-Lead    Disposition:   FU with me after cath.   Signed, Lauree Chandler, MD 12/26/2015 9:03 AM    [facility] [place], [place],   [address] Phone: [phone]; Fax: [phone]

## 2015-12-30 NOTE — Research (Signed)
Angel Rivera was given TWILIGHT study medication supply of Ticagrelor 90mg  tablets and Aspirin 81mg  tablets. Discharge instructions were given to the patient and she verbalized understanding.

## 2015-12-30 NOTE — Research (Signed)
TWILIGHT Informed Consent   Subject Name: Angel Rivera  Subject met inclusion and exclusion criteria.  The informed consent form, study requirements and expectations were reviewed with the subject and questions and concerns were addressed prior to the signing of the consent form.  The subject verbalized understanding of the trail requirements.  The subject agreed to participate in the TWILIGHT trial and signed the informed consent.  The informed consent was obtained prior to performance of any protocol-specific procedures for the subject.  A copy of the signed informed consent was given to the subject and a copy was placed in the subject's medical record.  Berneda Rose 12/30/2015, 5:20 PM

## 2015-12-30 NOTE — Progress Notes (Signed)
C/o mid chest pain stated "discomfort" 7/10 pain scale. Medicated with ntg SL x1 with relief. Jettie Booze NP notified,stat EKG done , seen and evaluated by above NP.monitor closely.

## 2015-12-30 NOTE — Progress Notes (Signed)
Called in by lab ,troponin Level 0.03, relayed to Jettie Booze NP

## 2015-12-30 NOTE — Care Management Note (Signed)
Case Management Note  Patient Details  Name: Angel Rivera MRN: [id number] Date of Birth: [birth year]  Subjective/Objective:     Patient is from home, s/p pci, will be participating in [place] study for [name], [place] will cont to follow for dc needs.                Action/Plan:   Expected Discharge Date:                  Expected Discharge Plan:  Home/Self Care  In-House Referral:     Discharge planning Services  CM Consult  Post Acute Care Choice:    Choice offered to:     DME Arranged:    DME Agency:     HH Arranged:    HH Agency:     Status of Service:  Completed, signed off  If discussed at [employer] of Stay Meetings, dates discussed:    Additional Comments:  Zenon Mayo, RN 12/30/2015, 11:32 PM

## 2015-12-31 DIAGNOSIS — I200 Unstable angina: Secondary | ICD-10-CM

## 2015-12-31 DIAGNOSIS — Z87891 Personal history of nicotine dependence: Secondary | ICD-10-CM | POA: Diagnosis not present

## 2015-12-31 DIAGNOSIS — Z7982 Long term (current) use of aspirin: Secondary | ICD-10-CM | POA: Diagnosis not present

## 2015-12-31 DIAGNOSIS — I25110 Atherosclerotic heart disease of native coronary artery with unstable angina pectoris: Secondary | ICD-10-CM | POA: Diagnosis not present

## 2015-12-31 DIAGNOSIS — Z955 Presence of coronary angioplasty implant and graft: Secondary | ICD-10-CM | POA: Diagnosis not present

## 2015-12-31 DIAGNOSIS — E785 Hyperlipidemia, unspecified: Secondary | ICD-10-CM | POA: Diagnosis not present

## 2015-12-31 DIAGNOSIS — Z79899 Other long term (current) drug therapy: Secondary | ICD-10-CM | POA: Diagnosis not present

## 2015-12-31 DIAGNOSIS — I252 Old myocardial infarction: Secondary | ICD-10-CM | POA: Diagnosis not present

## 2015-12-31 LAB — CBC
HEMATOCRIT: 36.9 % (ref 36.0–46.0)
HEMOGLOBIN: 11.9 g/dL — AB (ref 12.0–15.0)
MCH: 31.4 pg (ref 26.0–34.0)
MCHC: 32.2 g/dL (ref 30.0–36.0)
MCV: 97.4 fL (ref 78.0–100.0)
Platelets: 249 10*3/uL (ref 150–400)
RBC: 3.79 MIL/uL — AB (ref 3.87–5.11)
RDW: 12.9 % (ref 11.5–15.5)
WBC: 8.8 10*3/uL (ref 4.0–10.5)

## 2015-12-31 LAB — BASIC METABOLIC PANEL
ANION GAP: 7 (ref 5–15)
BUN: 13 mg/dL (ref 6–20)
CO2: 26 mmol/L (ref 22–32)
Calcium: 8.9 mg/dL (ref 8.9–10.3)
Chloride: 110 mmol/L (ref 101–111)
Creatinine, Ser: 1.13 mg/dL — ABNORMAL HIGH (ref 0.44–1.00)
GFR, EST AFRICAN AMERICAN: 58 mL/min — AB (ref >60)
GFR, EST NON AFRICAN AMERICAN: 50 mL/min — AB (ref >60)
GLUCOSE: 103 mg/dL — AB (ref 65–99)
POTASSIUM: 4 mmol/L (ref 3.5–5.1)
Sodium: 143 mmol/L (ref 135–145)

## 2015-12-31 LAB — TROPONIN I: Troponin I: 0.08 ng/mL (ref <0.03)

## 2015-12-31 MED ORDER — TICAGRELOR 90 MG PO TABS: 90.0000 mg | ORAL_TABLET | Freq: Two times a day (BID) | ORAL | Status: DC

## 2015-12-31 MED ORDER — NITROGLYCERIN 0.4 MG SL SUBL: 0.4000 mg | SUBLINGUAL_TABLET | SUBLINGUAL | 2 refills | Status: DC | PRN

## 2015-12-31 MED ORDER — ATORVASTATIN CALCIUM 40 MG PO TABS: 40.0000 mg | ORAL_TABLET | Freq: Every day | ORAL | 5 refills | Status: DC

## 2015-12-31 NOTE — Progress Notes (Signed)
Patient Profile: 65 yo WF with history of CAD, hyperlipidemia, tobacco abuse, s/p NSTEMI in 2011 w/ PCI to distal RCA + residual moderate disease in the mid LAD (50%), mid Circumflex (40%), proximal RCA (40%). Patient admitted 12/30/15 for elective LHC in the setting of recurrent unstable angina.   Subjective: CP free. No dyspnea. Radial cath site is stable.   Objective: Vital signs in last 24 hours: Temp:  [97.2 F (36.2 C)-97.9 F (36.6 C)] 97.2 F (36.2 C) (08/26 0428) Pulse Rate:  [0-81] 68 (08/26 0428) Resp:  [10-17] 15 (08/26 0428) BP: (93-161)/(42-84) 102/50 (08/26 0428) SpO2:  [98 %-100 %] 99 % (08/26 0428) Weight:  [142 lb 13.7 oz (64.8 kg)] 142 lb 13.7 oz (64.8 kg) (08/26 0428) Last BM Date: 12/29/15  Intake/Output from previous day: 08/25 0701 - 08/26 0700 In: 240 [P.O.:240] Out: 1650 [Urine:1650] Intake/Output this shift: No intake/output data recorded.  Medications Current Facility-Administered Medications  Medication Dose Route Frequency Provider Last Rate Last Dose  . 0.9 %  sodium chloride infusion  250 mL Intravenous PRN Burnell Blanks, MD      . acetaminophen (TYLENOL) tablet 650 mg  650 mg Oral Q4H PRN Burnell Blanks, MD      . alum & mag hydroxide-simeth (MAALOX/MYLANTA) 200-200-20 MG/5ML suspension 30 mL  30 mL Oral PRN Burnell Blanks, MD   30 mL at 12/30/15 1123  . aspirin EC tablet 81 mg  81 mg Oral Daily Burnell Blanks, MD      . atorvastatin (LIPITOR) tablet 40 mg  40 mg Oral q1800 Burnell Blanks, MD   40 mg at 12/30/15 1808  . carvedilol (COREG) tablet 3.125 mg  3.125 mg Oral BID WC Burnell Blanks, MD   3.125 mg at 12/30/15 1809  . morphine 2 MG/ML injection 2 mg  2 mg Intravenous Q1H PRN Burnell Blanks, MD      . nitroGLYCERIN (NITROSTAT) SL tablet 0.4 mg  0.4 mg Sublingual Q5 min PRN Burnell Blanks, MD   0.4 mg at 12/30/15 1449  . ondansetron (ZOFRAN) injection 4 mg  4 mg Intravenous  Q6H PRN Burnell Blanks, MD      . oxyCODONE-acetaminophen (PERCOCET/ROXICET) 5-325 MG per tablet 1-2 tablet  1-2 tablet Oral Q4H PRN Burnell Blanks, MD      . sodium chloride flush (NS) 0.9 % injection 3 mL  3 mL Intravenous Q12H Burnell Blanks, MD   3 mL at 12/30/15 1146  . sodium chloride flush (NS) 0.9 % injection 3 mL  3 mL Intravenous PRN Burnell Blanks, MD      . ticagrelor [facility]) tablet 90 mg  90 mg Oral BID Burnell Blanks, MD   90 mg at 12/30/15 2132    PE: General appearance: alert, cooperative and no distress Neck: no carotid bruit and no JVD Lungs: clear to auscultation bilaterally Heart: regular rate and rhythm, S1, S2 normal, no murmur, click, rub or gallop Extremities: no LEE Pulses: 2+ and symmetric Skin: warm and dry Neurologic: Grossly normal  Lab Results:   Recent Labs  12/31/15 0529  WBC 8.8  HGB 11.9*  HCT 36.9  PLT 249   BMET  Recent Labs  12/31/15 0529  NA 143  K 4.0  CL 110  CO2 26  GLUCOSE 103*  BUN 13  CREATININE 1.13*  CALCIUM 8.9    Studies/Results: Procedures   Coronary Stent Intervention  Left Heart Cath and Coronary Angiography  Conclusion     Mid RCA to Dist RCA lesion, 5 %stenosed.  A STENT RESOLUTE INTEG 2.5X22 drug eluting stent was successfully placed.  Mid RCA lesion, 80 %stenosed.  Post intervention, there is a 0% residual stenosis.  Mid Cx lesion, 50 %stenosed.  Mid LAD lesion, 50 %stenosed.  Dist LAD lesion, 30 %stenosed.  The left ventricular systolic function is normal.  LV end diastolic pressure is normal.  The left ventricular ejection fraction is 50-55% by visual estimate.  There is no mitral valve regurgitation.   1. Triple vessel CAD 2. Moderate stenosis in the mid LAD which is unchanged from cath in 2011. This does not appear to be flow limiting 3. Moderate stenosis mid Circumflex. This is unchanged in appearance from cath in 2011. This does not appear  to be flow limiting.  4. Severe stenosis mid RCA just before the old stent. This stenosis is hazy and appears ulcerated.  5. Normal LV systolic function 6. Successful PTCA/DES x 1 mid RCA      Assessment/Plan  Active Problems:   Unstable angina ([place])   1. Unstable Angina: Resolved. Likely secondary from severe mid RCA stenosis, successfully treated with PCI + DES. No recurrent CP. No dyspnea.  2. CAD: angiographic details outlined above. RCA with severe stenosis in the mid segmented, treated with PCI + DES. LAD and LCx unchanged from previous cath, non flow limiting lesions. Normal LVEF. CP free. Continue DAPT with ASA + Brilinta (Twilight Study). Continue statin + BB.   3. Tobacco abuse, in remission: She stopped smoking in February 2015.   4. Hyperlipidemia: recent FLP 12/26/15 showed LDL at 88 mg/dL. Goal LDL given CAD is <70 mg/dL. She just had her Lipitor increased to 40.  Continue current.   Dispo: d/c home today if stable after ambulating with cardiac rehab.    LOS: 0 days    Brittainy M. Rosita Fire, PA-C 12/31/2015 7:20 AM  History and all data above reviewed.  Patient examined.  I agree with the findings as above.  The patient exam reveals COR:RRR  ,  Lungs: Clear  ,  Abd: Positive bowel sounds, no rebound no guarding, Ext No edema, right wrist OK at cath site  .  All available labs, radiology testing, previous records reviewed. Agree with documented assessment and plan. Unstable angina:  Status post PCI.  OK to discharge.  Meds reviewed as above.    Emmalia Hiler  7:39 AM  12/31/2015

## 2015-12-31 NOTE — Discharge Summary (Signed)
Discharge Summary    Patient ID: Angel Rivera,  MRN: [id number], DOB/AGE: 1950-09-05 65 y.o.  Admit date: 12/30/2015 Discharge date: 12/31/2015  Primary Care Provider: Robert Bellow Primary Cardiologist: Dr. Buena Irish  Discharge Diagnoses    Active Problems:   Unstable angina [facility])   Allergies No Known Allergies  Diagnostic Studies/Procedures    Procedures   Coronary Stent Intervention  Left Heart Cath and Coronary Angiography  Conclusion     Mid RCA to Dist RCA lesion, 5 %stenosed.  A STENT RESOLUTE INTEG 2.5X22 drug eluting stent was successfully placed.  Mid RCA lesion, 80 %stenosed.  Post intervention, there is a 0% residual stenosis.  Mid Cx lesion, 50 %stenosed.  Mid LAD lesion, 50 %stenosed.  Dist LAD lesion, 30 %stenosed.  The left ventricular systolic function is normal.  LV end diastolic pressure is normal.  The left ventricular ejection fraction is 50-55% by visual estimate.  There is no mitral valve regurgitation.   1. Triple vessel CAD 2. Moderate stenosis in the mid LAD which is unchanged from cath in 2011. This does not appear to be flow limiting 3. Moderate stenosis mid Circumflex. This is unchanged in appearance from cath in 2011. This does not appear to be flow limiting.  4. Severe stenosis mid RCA just before the old stent. This stenosis is hazy and appears ulcerated.  5. Normal LV systolic function 6. Successful PTCA/DES x 1 mid RCA       History of Present Illness     65yo WF with history of CAD, hyperlipidemia, tobacco abuse, s/p NSTEMI in 2011 w/ PCI to distal RCA + residual moderate disease in the mid LAD (50%), mid Circumflex (40%), proximal RCA (40%). Patient admitted 12/30/15 for elective LHC in the setting of recurrent unstable angina.   Hospital Course      Patient presented to [facility] on 12/30/15 for the planned procedure, performed by Dr. Angelena Form. She was found to have triple vessel CAD with moderate stenosis in  the mid LAD which is unchanged from cath in 2011, not flow limiting, moderate stenosis in the mid circumflex, also unchanged from 2011 and non flow limiting. She was found to have new severe stenosis of the mid RCA just before the old stent. The stenosis was hazy and appeared to be ulcerated. Her RCA lesion was successfully treated with PTCA/DES x 1. LV systolic function was normal. She tolerated the procedure well and left the cath lab in stable condition. She was loaded with Brilinta and continued on ASA. Her Lipitor was increased from 20 mg to 40 mg given LDL was not at goal (88 mg/dL). Goal LDL is <70 mg/dL. She was continued on Coreg. Patient was enrolled in the [facility]. ASA and Brilinta was provided by the research team. She had no post cath complications. No recurrent CP. No dyspnea. She ambulated with cardiac rehab w/o exertional symptoms. Renal function and cath site remained stable. She was last seen and examined by Dr. Percival Spanish, who determined she was stable for d/c home. Post hospital f/u has been arranged with Estella Husk, PA-C on 01/30/16.    Consultants: none    Discharge Vitals Blood pressure (!) 105/59, pulse 78, temperature 98.3 F (36.8 C), temperature source Oral, resp. rate (!) 22, height 5\' 3"  (1.6 m), weight 142 lb 13.7 oz (64.8 kg), SpO2 97 %.  Filed Weights   12/30/15 0700 12/31/15 0428  Weight: 134 lb 6 oz (61 kg) 142 lb 13.7 oz (64.8 kg)  Labs & Radiologic Studies    CBC  Recent Labs  12/31/15 0529  WBC 8.8  HGB 11.9*  HCT 36.9  MCV 97.4  PLT 0000000   Basic Metabolic Panel  Recent Labs  12/31/15 0529  NA 143  K 4.0  CL 110  CO2 26  GLUCOSE 103*  BUN 13  CREATININE 1.13*  CALCIUM 8.9   Liver Function Tests No results for input(s): AST, ALT, ALKPHOS, BILITOT, PROT, ALBUMIN in the last 72 hours. No results for input(s): LIPASE, AMYLASE in the last 72 hours. Cardiac Enzymes  Recent Labs  12/30/15 1530 12/30/15 2052 12/31/15 0529    TROPONINI 0.03* 0.04* 0.08*   BNP Invalid input(s): POCBNP D-Dimer No results for input(s): DDIMER in the last 72 hours. Hemoglobin A1C No results for input(s): HGBA1C in the last 72 hours. Fasting Lipid Panel No results for input(s): CHOL, HDL, LDLCALC, TRIG, CHOLHDL, LDLDIRECT in the last 72 hours. Thyroid Function Tests No results for input(s): TSH, T4TOTAL, T3FREE, THYROIDAB in the last 72 hours.  Invalid input(s): FREET3 _____________  No results found. Disposition   Pt is being discharged home today in good condition.  Follow-up Plans & Appointments    Follow-up Information    Ermalinda Barrios, PA-C Follow up on 01/30/2016.   Specialty:  Cardiology Why:  8:45 AM  Contact information: [address] [place] [address] [phone]          Discharge Instructions    Amb Referral to Cardiac Rehabilitation    Complete by:  As directed   Diagnosis:  Coronary Stents   Diet - low sodium heart healthy    Complete by:  As directed   Increase activity slowly    Complete by:  As directed      Discharge Medications   Current Discharge Medication List    START taking these medications   Details  atorvastatin (LIPITOR) 40 MG tablet Take 1 tablet (40 mg total) by mouth daily at 6 PM. Qty: 30 tablet, Refills: 5    ticagrelor (BRILINTA) 90 MG TABS tablet Take 1 tablet (90 mg total) by mouth 2 (two) times daily. Take as directed by research team Qty: 60 tablet      CONTINUE these medications which have CHANGED   Details  nitroGLYCERIN (NITROSTAT) 0.4 MG SL tablet Place 1 tablet (0.4 mg total) under the tongue every 5 (five) minutes as needed. Qty: 25 tablet, Refills: 2      CONTINUE these medications which have NOT CHANGED   Details  aspirin 81 MG tablet Take 81 mg by mouth daily.      carvedilol (COREG) 3.125 MG tablet TAKE ONE TABLET BY MOUTH TWICE DAILY WITH  MEALS Qty: 60 tablet, Refills: 10      STOP taking these medications      lovastatin (MEVACOR) 40 MG tablet          Aspirin prescribed at discharge?  Yes High Intensity Statin Prescribed? (Lipitor 40-80mg  or Crestor 20-40mg ): Yes Beta Blocker Prescribed? Yes For EF <40%, was ACEI/ARB Prescribed? No:  ADP Receptor Inhibitor Prescribed? (i.e. Plavix etc.-Includes Medically Managed Patients): Yes For EF <40%, Aldosterone Inhibitor Prescribed? No: EF> 40% Was EF assessed during THIS hospitalization? Yes Was Cardiac Rehab II ordered? (Included Medically managed Patients): Yes   Outstanding Labs/Studies   None   Duration of Discharge Encounter   Greater than 30 minutes including physician time.  Signed, Lyda Jester PA-C 12/31/2015, 8:37 AM   Patient seen and examined.  Plan as discussed in my rounding note for today and outlined above. Angel Rivera  12/31/2015  8:39 AM

## 2015-12-31 NOTE — Progress Notes (Addendum)
CARDIAC REHAB PHASE I   PRE:  Rate/Rhythm: 85  BP:  Sitting: 104/66     SaO2: 98% ra  MODE:  Ambulation: 900 ft   POST:  Rate/Rhythm: 88  BP:  Sitting: 115/79     SaO2: 98% ra  7:45am-8:22am Patient ambulated at a steady pace and was able to talk the entire time. No complaints. Education completed. Interested in [place] at [facility].  [place], [place] 12/31/2015 8:17 AM

## 2016-01-02 MED FILL — Verapamil HCl IV Soln 2.5 MG/ML: INTRAVENOUS | Qty: 2 | Status: AC

## 2016-01-06 ENCOUNTER — Other Ambulatory Visit: Payer: Self-pay | Admitting: *Deleted

## 2016-01-06 MED ORDER — AMBULATORY NON FORMULARY MEDICATION: 90.0000 mg | Freq: Two times a day (BID) | Status: DC

## 2016-01-06 MED ORDER — AMBULATORY NON FORMULARY MEDICATION: 81.0000 mg | Freq: Every day | Status: DC

## 2016-01-13 ENCOUNTER — Encounter: Payer: Self-pay | Admitting: Physician Assistant

## 2016-01-16 ENCOUNTER — Telehealth: Payer: Self-pay | Admitting: Cardiovascular Disease

## 2016-01-16 DIAGNOSIS — R252 Cramp and spasm: Secondary | ICD-10-CM

## 2016-01-16 MED ORDER — ATORVASTATIN CALCIUM 40 MG PO TABS: 40.0000 mg | ORAL_TABLET | Freq: Every day | ORAL | 5 refills | Status: DC

## 2016-01-16 NOTE — Telephone Encounter (Signed)
New message       Pt has leg cramps----mainly at night.  Could this be coming from her cholesterol medication or should she start taking potassium?  Please call

## 2016-01-16 NOTE — Telephone Encounter (Signed)
SPOKE WITH   PT.PER PT HAS  NOTED   LEG CRAMPS  X 2  WEEKS   REALLY BAD  EPISODE  ON SAT   COULD NOT  BARE  ANY  WEIGHT  AT  ALL DUE  TO SEVERE  CRAMP. PT  THOUGHT K  MAY BE  LOW  BUT  LAST   LEVEL  WAS  4.0  FROM END OF  AUGUST . PER PT  CHANGED  CHOLESTEROL MED   TO LIPITOR   THIS  CHANGE WAS  DONE  AFTER  CATH   NOT  SURE MAY BE  THE CULPRIT  WILL  FORWARD  TO DR  Angelena Form FOR   REVIEW

## 2016-01-16 NOTE — Telephone Encounter (Signed)
Can we have her come by tomorrow for a BMET? Also, she can hold her Lipitor for one week to see if this helps. She had been on Mevacor and tolerating. Thanks, chris

## 2016-01-16 NOTE — Telephone Encounter (Signed)
I discussed Dr Camillia Herter recommendations with pt.   Pt agreed to come for BMET tomorrow, pt will hold atorvastatin for 1 week, then call in a week to let us know if leg cramps have improved off atorvastatin.

## 2016-01-17 ENCOUNTER — Other Ambulatory Visit (INDEPENDENT_AMBULATORY_CARE_PROVIDER_SITE_OTHER): Payer: PPO

## 2016-01-17 DIAGNOSIS — R252 Cramp and spasm: Secondary | ICD-10-CM | POA: Diagnosis not present

## 2016-01-17 LAB — BASIC METABOLIC PANEL
BUN: 11 mg/dL (ref 7–25)
CHLORIDE: 107 mmol/L (ref 98–110)
CO2: 25 mmol/L (ref 20–31)
Calcium: 9 mg/dL (ref 8.6–10.4)
Creat: 0.97 mg/dL (ref 0.50–0.99)
Glucose, Bld: 80 mg/dL (ref 65–99)
POTASSIUM: 4.1 mmol/L (ref 3.5–5.3)
SODIUM: 142 mmol/L (ref 135–146)

## 2016-01-23 ENCOUNTER — Telehealth: Payer: Self-pay | Admitting: Cardiovascular Disease

## 2016-01-23 MED ORDER — LOVASTATIN 40 MG PO TABS: 40.0000 mg | ORAL_TABLET | Freq: Every day | ORAL | 11 refills | Status: DC

## 2016-01-23 NOTE — Telephone Encounter (Signed)
Spoke with pt. She was able to tolerate Lovastatin without problems.  Lovastatin was increased to 40 mg daily based on lab results dated 12/26/15.  Pt is willing to try increased dose of Lovastatin 40 mg. I asked her to let us know if she develops leg pain/cramping.  She already has lab work (lipid and liver profiles) scheduled for 03/19/16 and will plan on keeping this.  Will send prescription to her pharmacy.  Pt would like to change 03/19/16 lab work to [place] and I told pt we could arrange this when she saw Ermalinda Barrios, PA on 9/25. Pt reports she previously was taking Coreg once daily. She has been taking twice daily since stent placed and reports fatigue.  Blood pressure 100/60-70.  No chest pain. Pain in shoulder gone since stent placed.  I asked her to discuss fatigue at upcoming appt.

## 2016-01-23 NOTE — Telephone Encounter (Signed)
She had been on Lovastatin and tolerating well. Can we check with her and see if she had leg cramps on Lovastatin before this was changed? If not, would resume Lovastatin 20 mg daily. We can add Lipitor to her list as a med she did not tolerate. Thanks, chris

## 2016-01-23 NOTE — Telephone Encounter (Signed)
New message      Pt c/o medication issue:  1. Name of Medication: lipitor 2. How are you currently taking this medication (dosage and times per day)? 40mg  daily 3. Are you having a reaction (difficulty breathing--STAT)? no 4. What is your medication issue?  Stopped medication 1 week ago.  Pt has not had any leg/muscle cramps. Pt was instructed to call and give a report after stopping medication

## 2016-01-24 NOTE — Telephone Encounter (Signed)
Agree. Thanks

## 2016-01-27 ENCOUNTER — Ambulatory Visit: Payer: Medicare Other | Admitting: Cardiovascular Disease

## 2016-01-30 ENCOUNTER — Encounter (HOSPITAL_COMMUNITY): Payer: Self-pay | Admitting: *Deleted

## 2016-01-30 ENCOUNTER — Encounter: Payer: Self-pay | Admitting: Physician Assistant

## 2016-01-30 ENCOUNTER — Encounter: Payer: Self-pay | Admitting: *Deleted

## 2016-01-30 ENCOUNTER — Telehealth: Payer: Self-pay | Admitting: *Deleted

## 2016-01-30 ENCOUNTER — Ambulatory Visit (INDEPENDENT_AMBULATORY_CARE_PROVIDER_SITE_OTHER): Payer: PPO | Admitting: Physician Assistant

## 2016-01-30 VITALS — BP 122/80 | HR 62 | Ht 63.0 in | Wt 135.0 lb

## 2016-01-30 DIAGNOSIS — I959 Hypotension, unspecified: Secondary | ICD-10-CM | POA: Diagnosis not present

## 2016-01-30 DIAGNOSIS — R252 Cramp and spasm: Secondary | ICD-10-CM | POA: Diagnosis not present

## 2016-01-30 DIAGNOSIS — I2510 Atherosclerotic heart disease of native coronary artery without angina pectoris: Secondary | ICD-10-CM

## 2016-01-30 DIAGNOSIS — Z006 Encounter for examination for normal comparison and control in clinical research program: Secondary | ICD-10-CM

## 2016-01-30 DIAGNOSIS — E7800 Pure hypercholesterolemia, unspecified: Secondary | ICD-10-CM

## 2016-01-30 MED ORDER — METOPROLOL SUCCINATE ER 25 MG PO TB24: 12.5000 mg | ORAL_TABLET | Freq: Every day | ORAL | 11 refills | Status: DC

## 2016-01-30 NOTE — Telephone Encounter (Signed)
Left message for patient to cal research office

## 2016-01-30 NOTE — Progress Notes (Signed)
TWILIGHT Research study month 1 telephone follow up completed. Patient states she has been compliant with Brilinta and ASA. She denies any bleeding or other adverse events. # month randomization appointment scheduled for 04/06/16 @ 9:30 Here at the Research office.

## 2016-01-30 NOTE — Progress Notes (Signed)
Cardiology Office Note    Date:  01/30/2016   ID:  Angel Rivera, Angel Rivera Nov 22, 1950, MRN [id number]  PCP:  Robert Bellow, MD  Cardiologist: Dr. Angelena Form  Chief Complaint  Patient presents with  . Follow-up    History of Present Illness:  Angel Rivera is a 65 y.o. female  with history of CAD, hyperlipidemia, tobacco abuse, s/p NSTEMI in 2011 w/ PCI to distal RCA + residual moderate disease in the mid LAD (50%), mid Circumflex (40%), proximal RCA (40%). Patient admitted 12/30/15 for elective LHC in the setting of recurrent unstable angina. She underwent successful PTCA/DES 1 to the mid RCA before the old stent. She had moderate stenosis of the mid LAD which is unchanged from cath in 2011 and moderate stenosis of the mid circumflex also unchanged neither appear to be flow-limiting. Normal LV systolic function. Patient was enrolled in the twilight study.  Patient comes in today for follow-up. She complains of extreme fatigue. She brings a list of her blood pressure readings that are running systolic [id number]. She was taking Coreg 3.125 once a day prior to hospitalization and is now taking it twice a day. She thinks it's too much. Her legs cramps have resolved since she has switched back to lovastatin. She denies any chest pain, palpitations, dyspnea, dyspnea on exertion, dizziness or presyncope.     Past Medical History:  Diagnosis Date  . Coronary artery disease    NATIVE VESSEL (ICD-414.01)-cath 08/17/09 with subtotally occluded RCA, moderate disease LAD.  Marland Kitchen Hyperlipidemia   . Hypotension   . Myocardial infarction ([place]) 08/17/2009  . Tobacco user     Past Surgical History:  Procedure Laterality Date  . ABDOMINAL HYSTERECTOMY  1980s   laparoscopic  . CARDIAC CATHETERIZATION N/A 12/30/2015   Procedure: Left Heart Cath and Coronary Angiography;  Surgeon: Burnell Blanks, MD;  Location: [place] [facility];  Service: Cardiovascular;  Laterality: N/A;  . CARDIAC CATHETERIZATION  N/A 12/30/2015   Procedure: Coronary Stent Intervention;  Surgeon: Burnell Blanks, MD;  Location: [place] [facility];  Service: Cardiovascular;  Laterality: N/A;  . CORONARY ANGIOPLASTY WITH STENT PLACEMENT  08/17/2009   "1 stent"  . TUBAL LIGATION  ~ 1980    Current Medications: Outpatient Medications Prior to Visit  Medication Sig Dispense Refill  . AMBULATORY NON FORMULARY MEDICATION Take 90 mg by mouth 2 (two) times daily. Medication Name: BRILINTA 90 mg BID (TWILIGHT Research study PROVIDED)    . AMBULATORY NON FORMULARY MEDICATION Take 81 mg by mouth daily. Medication Name: Aspirin 81 mg Daily  (TWILIGHT Research study PROVIDED)    . lovastatin (MEVACOR) 40 MG tablet Take 1 tablet (40 mg total) by mouth at bedtime. 30 tablet 11  . carvedilol (COREG) 3.125 MG tablet TAKE ONE TABLET BY MOUTH TWICE DAILY WITH  MEALS (Patient taking differently: Take 3.125 mg by mouth twice daily) 60 tablet 10  . nitroGLYCERIN (NITROSTAT) 0.4 MG SL tablet Place 1 tablet (0.4 mg total) under the tongue every 5 (five) minutes as needed. 25 tablet 2   No facility-administered medications prior to visit.      Allergies:   Atorvastatin   Social History   Social History  . Marital status: Divorced    Spouse name: N/A  . Number of children: N/A  . Years of education: N/A   Social History Main Topics  . Smoking status: Former Smoker    Packs/day: 0.50    Years: 33.00    Types: Cigarettes  Quit date: 05/07/2013  . Smokeless tobacco: Never Used  . Alcohol use Yes     Comment: 12/30/2015 "might have a drink once/year"  . Drug use: No  . Sexual activity: Not Asked   Other Topics Concern  . None   Social History Narrative   The patient has a almost 1-pack per day tobacco use     history.     She occasionally has alcohol-no abuse.     No illicit drug use   Divorced, 2 children     Family History:  The patient's   family history includes Heart attack in her mother; Heart disease in her  brother and father.   ROS:   Please see the history of present illness.    Review of Systems  Constitution: Positive for weakness and malaise/fatigue.  HENT: Negative.   Eyes: Negative.   Cardiovascular: Negative.   Respiratory: Negative.   Hematologic/Lymphatic: Bruises/bleeds easily.  Musculoskeletal: Negative.  Negative for joint pain.  Gastrointestinal: Negative.   Genitourinary: Negative.    All other systems reviewed and are negative.   PHYSICAL EXAM:   VS:  BP 122/80   Pulse 62   Ht 5\' 3"  (1.6 m)   Wt 135 lb (61.2 kg)   BMI 23.91 kg/m   Physical Exam  GEN: Well nourished, well developed, in no acute distress  Neck: no JVD, carotid bruits, or masses Cardiac:RRR; no murmurs, rubs, or gallops  Respiratory:  clear to auscultation bilaterally, normal work of breathing GI: soft, nontender, nondistended, + BS Ext: without cyanosis, clubbing, or edema, Good distal pulses bilaterally MS: no deformity or atrophy  Skin: warm and dry, no rash Psych: euthymic mood, full affect  Wt Readings from Last 3 Encounters:  01/30/16 135 lb (61.2 kg)  12/31/15 142 lb 13.7 oz (64.8 kg)  12/26/15 136 lb (61.7 kg)      Studies/Labs Reviewed:   EKG:  EKG is  ordered today.  The ekg ordered today demonstrates Normal sinus rhythm at 62 bpm, old septal infarct, no acute change  Recent Labs: 12/26/2015: ALT 13 12/31/2015: Hemoglobin 11.9; Platelets 249 01/17/2016: BUN 11; Creat 0.97; Potassium 4.1; Sodium 142   Lipid Panel    Component Value Date/Time   CHOL 182 12/26/2015 0905   TRIG 177 (H) 12/26/2015 0905   HDL 59 12/26/2015 0905   CHOLHDL 3.1 12/26/2015 0905   VLDL 35 (H) 12/26/2015 0905   LDLCALC 88 12/26/2015 0905   LDLDIRECT 113.7 10/15/2011 1105    Additional studies/ records that were reviewed today include:    Coronary Stent Intervention  Left Heart Cath and Coronary Angiography  Conclusion       Mid RCA to Dist RCA lesion, 5 %stenosed.  A STENT RESOLUTE INTEG  2.5X22 drug eluting stent was successfully placed.  Mid RCA lesion, 80 %stenosed.  Post intervention, there is a 0% residual stenosis.  Mid Cx lesion, 50 %stenosed.  Mid LAD lesion, 50 %stenosed.  Dist LAD lesion, 30 %stenosed.  The left ventricular systolic function is normal.  LV end diastolic pressure is normal.  The left ventricular ejection fraction is 50-55% by visual estimate.  There is no mitral valve regurgitation.   1. Triple vessel CAD 2. Moderate stenosis in the mid LAD which is unchanged from cath in 2011. This does not appear to be flow limiting 3. Moderate stenosis mid Circumflex. This is unchanged in appearance from cath in 2011. This does not appear to be flow limiting.  4. Severe stenosis mid RCA  just before the old stent. This stenosis is hazy and appears ulcerated.  5. Normal LV systolic function 6. Successful PTCA/DES x 1 mid RCA        ASSESSMENT:    1. CAD in native artery   2. Hypercholesteremia   3. HYPOTENSION   4. Cramps of lower extremity, unspecified laterality      PLAN:  In order of problems listed above:  CAD status post recent stent to the RCA. Prior stent to the RCA patent, residual CAD without change since 2011. Normal LVEF. Patient enrolled in the twilight study on Brilinta and aspirin. Doing well without angina  Hypercholesterolemia: Patient had severe leg cramps on Lipitor and is now doing better since switch back to Mevacor. For fasting lipid panels and LFTs in November in [place].  Hypotension and fatigue patient's blood pressures been running well on low-dose Coreg 3.125 mg twice a day. Discuss with Dr. Angelena Form who agrees to stop the Coreg and start low-dose metoprolol XL 25 mg half a tablet once daily. She is to call if she has problems with this dose.  Cramps of the lower extremities resolved off Lipitor and on Mevacor.    Medication Adjustments/Labs and Tests Ordered: Current medicines are reviewed at length with  the patient today.  Concerns regarding medicines are outlined above.  Medication changes, Labs and Tests ordered today are listed in the Patient Instructions below. Patient Instructions  Medication Instructions:  Your physician has recommended you make the following change in your medication:  1) STOP Carvedilol 2) START Metoprolol Succinate 12.5mg  (1/2 tablet) daily   Labwork: Your physician recommends that you return for a FASTING lipid profile and hepatic at a [facility]  lab in [place], the first week of November   Testing/Procedures: None ordered  Follow-Up: Your physician recommends that you schedule a follow-up appointment in: 2-3 months with Dr.Mcalhany   Any Other Special Instructions Will Be Listed Below (If Applicable).     If you need a refill on your cardiac medications before your next appointment, please call your pharmacy.      Sumner Boast, PA-C  01/30/2016 8:59 AM    [facility] [address], [place], [place]  [address] Phone: [phone]; Fax: [phone]

## 2016-01-30 NOTE — Patient Instructions (Signed)
Medication Instructions:  Your physician has recommended you make the following change in your medication:  1) STOP Carvedilol 2) START Metoprolol Succinate 12.5mg  (1/2 tablet) daily   Labwork: Your physician recommends that you return for a FASTING lipid profile and hepatic at a [facility]  lab in [place], the first week of November   Testing/Procedures: None ordered  Follow-Up: Your physician recommends that you schedule a follow-up appointment in: 2-3 months with Dr.Mcalhany   Any Other Special Instructions Will Be Listed Below (If Applicable).     If you need a refill on your cardiac medications before your next appointment, please call your pharmacy.

## 2016-03-19 ENCOUNTER — Encounter: Payer: Self-pay | Admitting: Cardiovascular Disease

## 2016-03-19 ENCOUNTER — Other Ambulatory Visit: Payer: Self-pay | Admitting: Cardiovascular Disease

## 2016-03-19 ENCOUNTER — Other Ambulatory Visit: Payer: PPO

## 2016-03-19 DIAGNOSIS — R252 Cramp and spasm: Secondary | ICD-10-CM | POA: Diagnosis not present

## 2016-03-19 LAB — BASIC METABOLIC PANEL
BUN: 16 mg/dL (ref 7–25)
CALCIUM: 9.4 mg/dL (ref 8.6–10.4)
CO2: 25 mmol/L (ref 20–31)
Chloride: 111 mmol/L — ABNORMAL HIGH (ref 98–110)
Creat: 1.05 mg/dL — ABNORMAL HIGH (ref 0.50–0.99)
GLUCOSE: 91 mg/dL (ref 65–99)
Potassium: 4 mmol/L (ref 3.5–5.3)
Sodium: 144 mmol/L (ref 135–146)

## 2016-03-27 ENCOUNTER — Telehealth: Payer: Self-pay | Admitting: Cardiovascular Disease

## 2016-03-27 NOTE — Telephone Encounter (Signed)
Pt states she had lab done at [facility] in [place] 03/19/16, I cannot locate copy of report.  I called [facility], spoke with Lattie Haw -- verbal report of BMET done 03/19/16:  Na 144 K 4.0 Cl 111 (98-110) CO2 25 Glu 91 BUN 16 Cr 1.05 (0.50-0.99) Ca 9.4 Anion Gap 8  Report to be faxed to Dr Angelena Form.

## 2016-03-27 NOTE — Telephone Encounter (Signed)
Pt is looking for lab results from last week from [facility]

## 2016-03-27 NOTE — Telephone Encounter (Signed)
I spoke with patient and gave her this information. From my discussion with patient and orders in Epic it appears that a fasting lipid/liver profile should have been done 03/19/16 not a BMET. I called and spoke with Terrence Dupont at [facility] call center [phone] states requisition generated when lab was done 11/13 indicates CMET. I am unable to get more information, Terrence Dupont states [employer] does not have a direct number.  Pt advised I will forward to Pat to follow up with her tomorrow. Pt states if she needs to go for additional blood draw she could go next [place] or Tues.

## 2016-03-28 NOTE — Telephone Encounter (Signed)
I spoke with [facility] in [place] (phone [phone]). They are unable to see lipid and liver profiles in computer.  Will fax orders to them-[phone].

## 2016-03-28 NOTE — Telephone Encounter (Signed)
Message left on pt's identified voicemail that lab orders have been faxed to [facility]. Left message to call back if questions.

## 2016-03-28 NOTE — Telephone Encounter (Signed)
I left message on voicemail for [facility] to call our office if faxed orders not received.  I placed call to pt to update her and left message to call back.

## 2016-04-03 ENCOUNTER — Other Ambulatory Visit: Payer: Self-pay | Admitting: Cardiovascular Disease

## 2016-04-03 DIAGNOSIS — E785 Hyperlipidemia, unspecified: Secondary | ICD-10-CM | POA: Diagnosis not present

## 2016-04-03 DIAGNOSIS — I25110 Atherosclerotic heart disease of native coronary artery with unstable angina pectoris: Secondary | ICD-10-CM | POA: Diagnosis not present

## 2016-04-03 LAB — CBC WITH DIFFERENTIAL/PLATELET
BASOS ABS: 71 {cells}/uL (ref 0–200)
Basophils Relative: 1 %
EOS ABS: 284 {cells}/uL (ref 15–500)
Eosinophils Relative: 4 %
HEMATOCRIT: 37.4 % (ref 35.0–45.0)
HEMOGLOBIN: 12.3 g/dL (ref 11.7–15.5)
LYMPHS ABS: 2130 {cells}/uL (ref 850–3900)
LYMPHS PCT: 30 %
MCH: 31.9 pg (ref 27.0–33.0)
MCHC: 32.9 g/dL (ref 32.0–36.0)
MCV: 97.1 fL (ref 80.0–100.0)
MONO ABS: 355 {cells}/uL (ref 200–950)
MPV: 9.5 fL (ref 7.5–12.5)
Monocytes Relative: 5 %
NEUTROS PCT: 60 %
Neutro Abs: 4260 cells/uL (ref 1500–7800)
Platelets: 286 10*3/uL (ref 140–400)
RBC: 3.85 MIL/uL (ref 3.80–5.10)
RDW: 13.5 % (ref 11.0–15.0)
WBC: 7.1 10*3/uL (ref 3.8–10.8)

## 2016-04-03 LAB — COMPREHENSIVE METABOLIC PANEL
ALBUMIN: 4 g/dL (ref 3.6–5.1)
ALK PHOS: 89 U/L (ref 33–130)
ALT: 15 U/L (ref 6–29)
AST: 22 U/L (ref 10–35)
BUN: 19 mg/dL (ref 7–25)
CHLORIDE: 109 mmol/L (ref 98–110)
CO2: 24 mmol/L (ref 20–31)
Calcium: 9.2 mg/dL (ref 8.6–10.4)
Creat: 1.28 mg/dL — ABNORMAL HIGH (ref 0.50–0.99)
Glucose, Bld: 86 mg/dL (ref 65–99)
POTASSIUM: 4.2 mmol/L (ref 3.5–5.3)
Sodium: 142 mmol/L (ref 135–146)
TOTAL PROTEIN: 6.8 g/dL (ref 6.1–8.1)
Total Bilirubin: 0.9 mg/dL (ref 0.2–1.2)

## 2016-04-03 LAB — LIPID PANEL
CHOL/HDL RATIO: 2.3 ratio (ref <5.0)
CHOLESTEROL: 166 mg/dL (ref <200)
HDL: 71 mg/dL (ref >50)
LDL Cholesterol: 71 mg/dL (ref <100)
TRIGLYCERIDES: 120 mg/dL (ref <150)
VLDL: 24 mg/dL (ref <30)

## 2016-04-03 LAB — PROTIME-INR
INR: 1
Prothrombin Time: 11.1 s (ref 9.0–11.5)

## 2016-04-05 NOTE — Telephone Encounter (Signed)
I spoke with [facility] lab and pt had lab work drawn there on 04/03/16. CMET, Lipid, CBC and PT were drawn based on what they were able to see in EPIC.  These results will be under lab tab when available.  Angel Rivera will check to see when results will be available.

## 2016-04-06 ENCOUNTER — Other Ambulatory Visit: Payer: Self-pay | Admitting: *Deleted

## 2016-04-06 ENCOUNTER — Encounter: Payer: Self-pay | Admitting: *Deleted

## 2016-04-06 DIAGNOSIS — Z006 Encounter for examination for normal comparison and control in clinical research program: Secondary | ICD-10-CM

## 2016-04-06 MED ORDER — AMBULATORY NON FORMULARY MEDICATION: 81.0000 mg | Freq: Every day | Status: DC

## 2016-04-06 NOTE — Progress Notes (Signed)
TWILIGHT Research study month 3 Randomization visit completed. Patient denies any bleeding or other adverse events. She states she has been compliant with research provided medication. Today she is randomized to ASA 81 mg Daily or PLACEBO as part of the TWILIGHT Research study protocol. She was dispensed bottle numbers [id number]; [id number]. Questions encouraged and answered.

## 2016-04-09 NOTE — Telephone Encounter (Signed)
Lab results reviewed by Dr. Angelena Form. Result message left on pt's identified voicemail. Left message to continue same medications and call office if questions.  Pt is seeing Dr. Angelena Form on 04/20/16

## 2016-04-20 ENCOUNTER — Ambulatory Visit (INDEPENDENT_AMBULATORY_CARE_PROVIDER_SITE_OTHER): Payer: PPO | Admitting: Cardiovascular Disease

## 2016-04-20 VITALS — BP 110/60 | HR 84 | Ht 63.0 in | Wt 136.8 lb

## 2016-04-20 DIAGNOSIS — F17201 Nicotine dependence, unspecified, in remission: Secondary | ICD-10-CM | POA: Diagnosis not present

## 2016-04-20 DIAGNOSIS — E7800 Pure hypercholesterolemia, unspecified: Secondary | ICD-10-CM | POA: Diagnosis not present

## 2016-04-20 DIAGNOSIS — I2510 Atherosclerotic heart disease of native coronary artery without angina pectoris: Secondary | ICD-10-CM | POA: Diagnosis not present

## 2016-04-20 NOTE — Progress Notes (Signed)
Chief Complaint  Patient presents with  . Coronary Artery Disease     History of Present Illness: 65 yo female with history of CAD, hyperlipidemia, tobacco abuse here today for cardiac follow up. She was admitted to [facility] April 13,2011 with a NSTEMI secondary to subtotally occluded distal RCA which was treated with a drug eluting stent. She was also found to have moderate disease in the mid LAD (50%), mid Circumflex (40%), proximal RCA (40%). She stopped smoking February 2015. I saw her in August 2017 and she c/o pain in her upper back which was her prior anginal equivalent. Cardiac cath 12/30/15 with severe stenosis mid RCA treated with drug eluting stent. There was stable moderate disease in the mid LAD and mid Circumflex. She has not tolerated Lipitor but has tolerated Mevacor. She also did not tolerate Coreg but has tolerated low dose Toprol.   She is here for follow up. She feels well. No chest pain or SOB. No LE edema. Rare palpitations.   Primary Care Physician: Robert Bellow, MD   Past Medical History:  Diagnosis Date  . Coronary artery disease    NATIVE VESSEL (ICD-414.01)-cath 08/17/09 with subtotally occluded RCA, moderate disease LAD.  Marland Kitchen Hyperlipidemia   . Hypotension   . Myocardial infarction 08/17/2009  . Tobacco user     Past Surgical History:  Procedure Laterality Date  . ABDOMINAL HYSTERECTOMY  1980s   laparoscopic  . CARDIAC CATHETERIZATION N/A 12/30/2015   Procedure: Left Heart Cath and Coronary Angiography;  Surgeon: Burnell Blanks, MD;  Location: [place] [facility];  Service: Cardiovascular;  Laterality: N/A;  . CARDIAC CATHETERIZATION N/A 12/30/2015   Procedure: Coronary Stent Intervention;  Surgeon: Burnell Blanks, MD;  Location: [place] [facility];  Service: Cardiovascular;  Laterality: N/A;  . CORONARY ANGIOPLASTY WITH STENT PLACEMENT  08/17/2009   "1 stent"  . TUBAL LIGATION  ~ 1980    Current Outpatient Prescriptions    Medication Sig Dispense Refill  . AMBULATORY NON FORMULARY MEDICATION Take 90 mg by mouth 2 (two) times daily. Medication Name: BRILINTA 90 mg BID (TWILIGHT Research study PROVIDED)    . AMBULATORY NON FORMULARY MEDICATION Take 81 mg by mouth daily. Medication Name: ASPIRIN 81 mg Daily Or PLACEBO (TWILIGHT Research Study)    . lovastatin (MEVACOR) 40 MG tablet Take 1 tablet (40 mg total) by mouth at bedtime. 30 tablet 11  . metoprolol succinate (TOPROL XL) 25 MG 24 hr tablet Take 0.5 tablets (12.5 mg total) by mouth daily. 15 tablet 11  . nitroGLYCERIN (NITROSTAT) 0.4 MG SL tablet Place 0.4 mg under the tongue every 5 (five) minutes as needed for chest pain. 3 DOSES MAX     No current facility-administered medications for this visit.     Allergies  Allergen Reactions  . Atorvastatin Other (See Comments)    Leg pain    Social History   Social History  . Marital status: Divorced    Spouse name: N/A  . Number of children: N/A  . Years of education: N/A   Occupational History  . Not on file.   Social History Main Topics  . Smoking status: Former Smoker    Packs/day: 0.50    Years: 33.00    Types: Cigarettes    Quit date: 05/07/2013  . Smokeless tobacco: Never Used  . Alcohol use Yes     Comment: 12/30/2015 "might have a drink once/year"  . Drug use: No  . Sexual activity: Not on  file   Other Topics Concern  . Not on file   Social History Narrative   The patient has a almost 1-pack per day tobacco use     history.     She occasionally has alcohol-no abuse.     No illicit drug use   Divorced, 2 children    Family History  Problem Relation Age of Onset  . Heart attack Mother   . Coronary artery disease    . Heart disease Father     bypass  . Heart disease Brother     bypass    Review of Systems:  As stated in the HPI and otherwise negative.   BP 110/60   Pulse 84   Ht 5\' 3"  (1.6 m)   Wt 136 lb 12.8 oz (62.1 kg)   BMI 24.23 kg/m   Physical  Examination: General: Well developed, well nourished, NAD  HEENT: OP clear, mucus membranes moist  SKIN: warm, dry. No rashes. Neuro: No focal deficits  Musculoskeletal: Muscle strength 5/5 all ext  Psychiatric: Mood and affect normal  Neck: No JVD, no carotid bruits, no thyromegaly, no lymphadenopathy.  Lungs:Clear bilaterally, no wheezes, rhonci, crackles Cardiovascular: Regular rate and rhythm. No murmurs, gallops or rubs. Abdomen:Soft. Bowel sounds present. Non-tender.  Extremities: No lower extremity edema. Pulses are 2 + in the bilateral DP/PT.  EKG:  EKG is not ordered today. The ekg ordered today demonstrates   Recent Labs: 04/03/2016: ALT 15; BUN 19; Creat 1.28; Hemoglobin 12.3; Platelets 286; Potassium 4.2; Sodium 142   Lipid Panel    Component Value Date/Time   CHOL 166 04/03/2016 0919   TRIG 120 04/03/2016 0919   HDL 71 04/03/2016 0919   CHOLHDL 2.3 04/03/2016 0919   VLDL 24 04/03/2016 0919   LDLCALC 71 04/03/2016 0919   LDLDIRECT 113.7 10/15/2011 1105     Wt Readings from Last 3 Encounters:  04/20/16 136 lb 12.8 oz (62.1 kg)  01/30/16 135 lb (61.2 kg)  12/31/15 142 lb 13.7 oz (64.8 kg)     Other studies Reviewed: Additional studies/ records that were reviewed today include: . Review of the above records demonstrates:    Assessment and Plan:   1. CAD: She is doing well following PCI/stenting of the mid RCA in August 2017. No recurrent back pain. Her anginal equivalent is left scapular pain. Will continue ASA, Brilinta, statin and beta blocker.   2. Tobacco abuse, in remission: She stopped smoking in February 2015.   3. Hyperlipidemia: Continue statin. Lipids well controlled.   Current medicines are reviewed at length with the patient today.  The patient does not have concerns regarding medicines.  The following changes have been made:  no change  Labs/ tests ordered today include:   No orders of the defined types were placed in this  encounter.   Disposition:   FU with me 12 months.   Signed, Lauree Chandler, MD 04/20/2016 9:23 AM    [address], [place], [place]  [address] Phone: [phone]; Fax: [phone]

## 2016-04-20 NOTE — Patient Instructions (Signed)

## 2016-04-24 ENCOUNTER — Telehealth: Payer: Self-pay | Admitting: *Deleted

## 2016-04-24 ENCOUNTER — Encounter: Payer: Self-pay | Admitting: *Deleted

## 2016-04-24 DIAGNOSIS — Z006 Encounter for examination for normal comparison and control in clinical research program: Secondary | ICD-10-CM

## 2016-04-24 NOTE — Progress Notes (Signed)
TWILIGHT Research study month 4 telephone follow up visit completed. Patient denies any bleeding or other adverse events. She states she has been compliant with study provided medication. Next research required visit due no later than 21/JUN/17. Questions encouraged and answered.

## 2016-04-24 NOTE — Telephone Encounter (Signed)
Left message for patient to call research office for month 4 follow up visit.

## 2016-05-04 ENCOUNTER — Other Ambulatory Visit (HOSPITAL_COMMUNITY): Payer: Self-pay | Admitting: Family Medicine

## 2016-05-04 ENCOUNTER — Ambulatory Visit (HOSPITAL_COMMUNITY)
Admission: RE | Admit: 2016-05-04 | Discharge: 2016-05-04 | Disposition: A | Discharge status: Home / Self Care | Payer: PPO | Source: Ambulatory Visit | Attending: Family Medicine | Admitting: Family Medicine

## 2016-05-04 DIAGNOSIS — W5501XA Bitten by cat, initial encounter: Secondary | ICD-10-CM | POA: Insufficient documentation

## 2016-05-04 DIAGNOSIS — M79641 Pain in right hand: Secondary | ICD-10-CM | POA: Diagnosis not present

## 2016-05-04 DIAGNOSIS — L03113 Cellulitis of right upper limb: Secondary | ICD-10-CM | POA: Diagnosis not present

## 2016-05-04 DIAGNOSIS — S60571A Other superficial bite of hand of right hand, initial encounter: Secondary | ICD-10-CM | POA: Insufficient documentation

## 2016-06-17 ENCOUNTER — Observation Stay (HOSPITAL_COMMUNITY)
Admission: EM | Admit: 2016-06-17 | Discharge: 2016-06-18 | Disposition: A | Discharge status: Home / Self Care | Payer: PPO | Attending: Internal Medicine | Admitting: Internal Medicine

## 2016-06-17 ENCOUNTER — Emergency Department (HOSPITAL_COMMUNITY): Payer: PPO

## 2016-06-17 ENCOUNTER — Encounter (HOSPITAL_COMMUNITY): Payer: Self-pay | Admitting: Emergency Medicine

## 2016-06-17 DIAGNOSIS — I2510 Atherosclerotic heart disease of native coronary artery without angina pectoris: Secondary | ICD-10-CM | POA: Diagnosis not present

## 2016-06-17 DIAGNOSIS — R071 Chest pain on breathing: Secondary | ICD-10-CM

## 2016-06-17 DIAGNOSIS — J069 Acute upper respiratory infection, unspecified: Secondary | ICD-10-CM | POA: Diagnosis not present

## 2016-06-17 DIAGNOSIS — Z87891 Personal history of nicotine dependence: Secondary | ICD-10-CM | POA: Diagnosis not present

## 2016-06-17 DIAGNOSIS — R0789 Other chest pain: Secondary | ICD-10-CM | POA: Diagnosis not present

## 2016-06-17 DIAGNOSIS — R05 Cough: Secondary | ICD-10-CM | POA: Diagnosis not present

## 2016-06-17 DIAGNOSIS — R079 Chest pain, unspecified: Secondary | ICD-10-CM | POA: Diagnosis present

## 2016-06-17 DIAGNOSIS — I252 Old myocardial infarction: Secondary | ICD-10-CM | POA: Diagnosis not present

## 2016-06-17 LAB — CBC
HCT: 40.4 % (ref 36.0–46.0)
Hemoglobin: 13.8 g/dL (ref 12.0–15.0)
MCH: 32.9 pg (ref 26.0–34.0)
MCHC: 34.2 g/dL (ref 30.0–36.0)
MCV: 96.4 fL (ref 78.0–100.0)
PLATELETS: 239 10*3/uL (ref 150–400)
RBC: 4.19 MIL/uL (ref 3.87–5.11)
RDW: 13.3 % (ref 11.5–15.5)
WBC: 5.6 10*3/uL (ref 4.0–10.5)

## 2016-06-17 LAB — BASIC METABOLIC PANEL
ANION GAP: 8 (ref 5–15)
BUN: 12 mg/dL (ref 6–20)
CALCIUM: 8.7 mg/dL — AB (ref 8.9–10.3)
CO2: 22 mmol/L (ref 22–32)
Chloride: 107 mmol/L (ref 101–111)
Creatinine, Ser: 1.07 mg/dL — ABNORMAL HIGH (ref 0.44–1.00)
GFR, EST NON AFRICAN AMERICAN: 53 mL/min — AB (ref >60)
Glucose, Bld: 96 mg/dL (ref 65–99)
Potassium: 3.8 mmol/L (ref 3.5–5.1)
SODIUM: 137 mmol/L (ref 135–145)

## 2016-06-17 LAB — TROPONIN I
Troponin I: <0.03 ng/mL (ref <0.03)
Troponin I: <0.03 ng/mL (ref <0.03)
Troponin I: <0.03 ng/mL (ref <0.03)

## 2016-06-17 LAB — INFLUENZA PANEL BY PCR (TYPE A & B)
INFLAPCR: NEGATIVE
INFLBPCR: POSITIVE — AB

## 2016-06-17 MED ORDER — ONDANSETRON HCL 4 MG/2ML IJ SOLN: 4.0000 mg | Freq: Four times a day (QID) | INTRAMUSCULAR | Status: DC | PRN

## 2016-06-17 MED ORDER — ASPIRIN 81 MG PO CHEW
81.0000 mg | CHEWABLE_TABLET | Freq: Every day | ORAL | Status: DC
  Administered 2016-06-18: 81 mg via ORAL
  Filled 2016-06-17: qty 1

## 2016-06-17 MED ORDER — OSELTAMIVIR PHOSPHATE 75 MG PO CAPS
75.0000 mg | ORAL_CAPSULE | Freq: Two times a day (BID) | ORAL | Status: DC
  Administered 2016-06-17 – 2016-06-18 (×2): 75 mg via ORAL
  Filled 2016-06-17 (×2): qty 1

## 2016-06-17 MED ORDER — ENOXAPARIN SODIUM 40 MG/0.4ML ~~LOC~~ SOLN: 40.0000 mg | SUBCUTANEOUS | Status: DC

## 2016-06-17 MED ORDER — PRAVASTATIN SODIUM 40 MG PO TABS
40.0000 mg | ORAL_TABLET | Freq: Every day | ORAL | Status: DC
  Administered 2016-06-17: 40 mg via ORAL
  Filled 2016-06-17: qty 1

## 2016-06-17 MED ORDER — NITROGLYCERIN 0.4 MG SL SUBL: 0.4000 mg | SUBLINGUAL_TABLET | SUBLINGUAL | Status: DC | PRN

## 2016-06-17 MED ORDER — TICAGRELOR 90 MG PO TABS
90.0000 mg | ORAL_TABLET | Freq: Two times a day (BID) | ORAL | Status: DC
  Administered 2016-06-17 – 2016-06-18 (×2): 90 mg via ORAL
  Filled 2016-06-17 (×6): qty 1

## 2016-06-17 MED ORDER — GI COCKTAIL ~~LOC~~: 30.0000 mL | Freq: Four times a day (QID) | ORAL | Status: DC | PRN

## 2016-06-17 MED ORDER — ACETAMINOPHEN 325 MG PO TABS
650.0000 mg | ORAL_TABLET | ORAL | Status: DC | PRN
  Administered 2016-06-17 – 2016-06-18 (×2): 650 mg via ORAL
  Filled 2016-06-17 (×2): qty 2

## 2016-06-17 MED ORDER — METOPROLOL SUCCINATE ER 25 MG PO TB24
12.5000 mg | ORAL_TABLET | Freq: Every day | ORAL | Status: DC
  Administered 2016-06-18: 12.5 mg via ORAL
  Filled 2016-06-17: qty 1

## 2016-06-17 MED ORDER — NITROGLYCERIN 0.4 MG SL SUBL
0.4000 mg | SUBLINGUAL_TABLET | Freq: Once | SUBLINGUAL | Status: AC
  Administered 2016-06-17: 0.4 mg via SUBLINGUAL
  Filled 2016-06-17: qty 1

## 2016-06-17 MED ORDER — GUAIFENESIN-DM 100-10 MG/5ML PO SYRP
5.0000 mL | ORAL_SOLUTION | ORAL | Status: DC | PRN
  Administered 2016-06-17: 5 mL via ORAL
  Filled 2016-06-17: qty 5

## 2016-06-17 MED ORDER — PNEUMOCOCCAL VAC POLYVALENT 25 MCG/0.5ML IJ INJ: 0.5000 mL | INJECTION | INTRAMUSCULAR | Status: DC

## 2016-06-17 NOTE — H&P (Signed)
History and Physical    Angel Rivera [id number] DOB: 1951/04/18 DOA: 06/17/2016  Referring MD/NP/PA: Davonna Belling, EDP PCP: Robert Bellow, MD  Patient coming from: Home  Chief Complaint: Chest pain, left scapular pain, URI symptoms  HPI: RANDEE Rivera is a 66 y.o. female with a history of coronary artery disease with most recent cath in August 2017 that showed triple-vessel coronary artery disease with moderate stenosis in the mid LAD, moderate stenosis of the mid circumflex, severe stenosis of the mid RCA just before the old stent, status post PTCA and drug-eluting stent 1. She was enrolled in the twilight study which consists of aspirin and Brilinta for 3 months followed by randomization to Brilinta alone or aspirin and Brilinta. She presents today with centralized chest pain that began earlier today while she was sitting at home. Pain radiated to her left shoulder and stated that pain felt similar to when she had her MI in 2013. She also admits to having URI symptoms with subjective fever and chills, cough, diarrhea and sore throat. She has had sick contacts including her neighbor. Admission has been requested. In the ED initial troponin is negative and EKG does not show any acute changes.  Past Medical/Surgical History: Past Medical History:  Diagnosis Date  . Coronary artery disease    NATIVE VESSEL (ICD-414.01)-cath 08/17/09 with subtotally occluded RCA, moderate disease LAD.  Marland Kitchen Hyperlipidemia   . Hypotension   . Myocardial infarction 08/17/2009  . Tobacco user     Past Surgical History:  Procedure Laterality Date  . ABDOMINAL HYSTERECTOMY  1980s   laparoscopic  . CARDIAC CATHETERIZATION N/A 12/30/2015   Procedure: Left Heart Cath and Coronary Angiography;  Surgeon: Burnell Blanks, MD;  Location: [place] [facility];  Service: Cardiovascular;  Laterality: N/A;  . CARDIAC CATHETERIZATION N/A 12/30/2015   Procedure: Coronary Stent Intervention;  Surgeon:  Burnell Blanks, MD;  Location: [place] [facility];  Service: Cardiovascular;  Laterality: N/A;  . CORONARY ANGIOPLASTY WITH STENT PLACEMENT  08/17/2009   "1 stent"  . TUBAL LIGATION  ~ 1980    Social History:  reports that she quit smoking about 3 years ago. Her smoking use included Cigarettes. She has a 16.50 pack-year smoking history. She has never used smokeless tobacco. She reports that she drinks alcohol. She reports that she does not use drugs.  Allergies: Allergies  Allergen Reactions  . Atorvastatin Other (See Comments)    Leg pain    Family History:  Family History  Problem Relation Age of Onset  . Heart attack Mother   . Coronary artery disease    . Heart disease Father     bypass  . Heart disease Brother     bypass    Prior to Admission medications   Medication Sig Start Date End Date Taking? Authorizing Provider  AMBULATORY NON FORMULARY MEDICATION Take 90 mg by mouth 2 (two) times daily. Medication Name: BRILINTA 90 mg BID (TWILIGHT Research study PROVIDED) 12/30/15  Yes Burnell Blanks, MD  AMBULATORY NON FORMULARY MEDICATION Take 81 mg by mouth daily. Medication Name: ASPIRIN 81 mg Daily Or PLACEBO (TWILIGHT Research Study) 04/06/16  Yes Burnell Blanks, MD  lovastatin (MEVACOR) 40 MG tablet Take 1 tablet (40 mg total) by mouth at bedtime. 01/23/16  Yes Burnell Blanks, MD  metoprolol succinate (TOPROL XL) 25 MG 24 hr tablet Take 0.5 tablets (12.5 mg total) by mouth daily. 01/30/16  Yes Imogene Burn, PA-C  nitroGLYCERIN (NITROSTAT) 0.4  MG SL tablet Place 0.4 mg under the tongue every 5 (five) minutes as needed for chest pain. 3 DOSES MAX   Yes Historical Provider, MD    Review of Systems:  Constitutional: Positive for fever, chills, diaphoresis, appetite change and fatigue.  HEENT: Denies photophobia, eye pain, redness, hearing loss, ear pain, congestion, sore throat, rhinorrhea, sneezing, mouth sores, trouble swallowing, neck pain,  neck stiffness and tinnitus.   Respiratory: Denies SOB, DOE, cough, chest tightness,  and wheezing.   Cardiovascular: Denies, palpitations and leg swelling.  Gastrointestinal: Denies nausea, vomiting, abdominal pain, diarrhea, constipation, blood in stool and abdominal distention.  Genitourinary: Denies dysuria, urgency, frequency, hematuria, flank pain and difficulty urinating.  Endocrine: Denies: hot or cold intolerance, sweats, changes in hair or nails, polyuria, polydipsia. Musculoskeletal: Denies myalgias, back pain, joint swelling, arthralgias and gait problem.  Skin: Denies pallor, rash and wound.  Neurological: Denies dizziness, seizures, syncope, weakness, light-headedness, numbness and headaches.  Hematological: Denies adenopathy. Easy bruising, personal or family bleeding history  Psychiatric/Behavioral: Denies suicidal ideation, mood changes, confusion, nervousness, sleep disturbance and agitation    Physical Exam: Vitals:   06/17/16 1100 06/17/16 1130 06/17/16 1206 06/17/16 1511  BP: 101/73 118/73 125/73 124/87  Pulse: 81 81 82 75  Resp: 17 15 18 18   Temp:   99.1 F (37.3 C) 98.1 F (36.7 C)  TempSrc:   Oral   SpO2: 96% 98% 100% 98%  Weight:      Height:         Constitutional: NAD, calm, comfortable Eyes: PERRL, lids and conjunctivae normal ENMT: Mucous membranes are moist. Posterior pharynx clear of any exudate or lesions.Normal dentition.  Neck: normal, supple, no masses, no thyromegaly Respiratory: clear to auscultation bilaterally, no wheezing, no crackles. Normal respiratory effort. No accessory muscle use.  Cardiovascular: Regular rate and rhythm, no murmurs / rubs / gallops. No extremity edema. 2+ pedal pulses. No carotid bruits.  Abdomen: no tenderness, no masses palpated. No hepatosplenomegaly. Bowel sounds positive.  Musculoskeletal: no clubbing / cyanosis. No joint deformity upper and lower extremities. Good ROM, no contractures. Normal muscle tone.    Skin: no rashes, lesions, ulcers. No induration Neurologic: CN 2-12 grossly intact. Sensation intact, DTR normal. Strength 5/5 in all 4.  Psychiatric: Normal judgment and insight. Alert and oriented x 3. Normal mood.    Labs on Admission: I have personally reviewed the following labs and imaging studies  CBC:  Recent Labs Lab 06/17/16 0858  WBC 5.6  HGB 13.8  HCT 40.4  MCV 96.4  PLT [id number]   Basic Metabolic Panel:  Recent Labs Lab 06/17/16 0858  NA 137  K 3.8  CL 107  CO2 22  GLUCOSE 96  BUN 12  CREATININE 1.07*  CALCIUM 8.7*   GFR: Estimated Creatinine Clearance: 46.4 mL/min (by C-G formula based on SCr of 1.07 mg/dL (H)). Liver Function Tests: No results for input(s): AST, ALT, ALKPHOS, BILITOT, PROT, ALBUMIN in the last 168 hours. No results for input(s): LIPASE, AMYLASE in the last 168 hours. No results for input(s): AMMONIA in the last 168 hours. Coagulation Profile: No results for input(s): INR, PROTIME in the last 168 hours. Cardiac Enzymes:  Recent Labs Lab 06/17/16 0858 06/17/16 1313 06/17/16 1641  TROPONINI <0.03 <0.03 <0.03   BNP (last 3 results) No results for input(s): PROBNP in the last 8760 hours. HbA1C: No results for input(s): HGBA1C in the last 72 hours. CBG: No results for input(s): GLUCAP in the last 168 hours. Lipid Profile: No  results for input(s): CHOL, HDL, LDLCALC, TRIG, CHOLHDL, LDLDIRECT in the last 72 hours. Thyroid Function Tests: No results for input(s): TSH, T4TOTAL, FREET4, T3FREE, THYROIDAB in the last 72 hours. Anemia Panel: No results for input(s): VITAMINB12, FOLATE, FERRITIN, TIBC, IRON, RETICCTPCT in the last 72 hours. Urine analysis:    Component Value Date/Time   COLORURINE YELLOW 08/17/2009 1406   APPEARANCEUR CLEAR 08/17/2009 1406   LABSPEC >1.030 (H) 08/17/2009 1406   PHURINE 5.0 08/17/2009 1406   GLUCOSEU NEGATIVE 08/17/2009 1406   HGBUR SMALL (A) 08/17/2009 1406   BILIRUBINUR NEGATIVE 08/17/2009 1406    KETONESUR NEGATIVE 08/17/2009 1406   PROTEINUR NEGATIVE 08/17/2009 1406   UROBILINOGEN 0.2 08/17/2009 1406   NITRITE NEGATIVE 08/17/2009 1406   LEUKOCYTESUR NEGATIVE 08/17/2009 1406   Sepsis Labs: @LABRCNTIP (procalcitonin:4,lacticidven:4) )No results found for this or any previous visit (from the past 240 hour(s)).   Radiological Exams on Admission: Dg Chest 2 View  Result Date: 06/17/2016 CLINICAL DATA:  Cough and chest pain for 3 days non smoker surg-2 stent HTN EXAM: CHEST  2 VIEW COMPARISON:  08/17/2009 FINDINGS: Normal mediastinum and cardiac silhouette. Normal pulmonary vasculature. No evidence of effusion, infiltrate, or pneumothorax. No acute bony abnormality. IMPRESSION: No acute cardiopulmonary process. Electronically Signed   By: Suzy Bouchard M.D.   On: 06/17/2016 09:29    EKG: Independently reviewed. Normal sinus rhythm, no acute ischemic changes  Assessment/Plan Active Problems:   Chest pain    Chest pain with history of coronary artery disease  -admit to telemetry, cycle troponins, repeat EKG in a.m. -Given her strong history of coronary artery disease will elect to consult cardiology for further recommendations. -Given patient is enrolled in the TWILIGHT study, have requested pharmacy help with dosing study medications. -It's possible that her symptoms might be flu related.  Influenza B  -She is within the window for initiating Tamiflu, will order.   DVT prophylaxis: Lovenox   Code Status: Full code   Family Communication: Patient only   Disposition Plan: Hope for discharge home in 24 hours   Consults called: Cardiology   Admission status: Observation   Time Spent: 75 minutes   Lelon Frohlich MD [facility] Pager [phone]  If 7PM-7AM, please contact night-coverage [email or web address] Password [facility]  06/17/2016, 6:03 PM

## 2016-06-17 NOTE — ED Notes (Signed)
Patient transported to X-ray 

## 2016-06-17 NOTE — ED Notes (Signed)
DR. Pickering at bedside.  

## 2016-06-17 NOTE — ED Provider Notes (Signed)
[facility] DEPT Provider Note   CSN: [id number] Arrival date & time: 06/17/16  [id number]   By signing my name below, I, Angel Rivera, attest that this documentation has been prepared under the direction and in the presence of Angel Belling, MD. Electronically Signed: Hilbert Rivera, Scribe. 06/17/16. 9:22 AM. History   Chief Complaint Chief Complaint  Patient presents with  . Chest Pain     The history is provided by the patient. No language interpreter was used.    HPI Comments: Angel Rivera is a 66 y.o. female who presents to the Emergency Department complaining of centralized chest pain that began earlier today. She states that this pain radiates to her left shoulder. She also states that this pain feels similar to when she had an MI in 2013. She reports having a sore throat, subjective fever, chills, cough, and diarrhea. She denies any vomiting. She states that she had been around her neighbor that has been sick recently. She denies any changes in her medications. She currently takes NTG at home.  Past Medical History:  Diagnosis Date  . Coronary artery disease    NATIVE VESSEL (ICD-414.01)-cath 08/17/09 with subtotally occluded RCA, moderate disease LAD.  Marland Kitchen Hyperlipidemia   . Hypotension   . Myocardial infarction 08/17/2009  . Tobacco user     Patient Active Problem List   Diagnosis Date Noted  . Unstable angina ([place]) 12/30/2015  . HYPERCHOLESTEROLEMIA 09/09/2009  . MYOCARDIAL INFARCTION, 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  . Coronary artery disease involving native coronary artery of native heart with unstable angina pectoris ([place]) 09/09/2009  . HYPOTENSION 09/09/2009    Past Surgical History:  Procedure Laterality Date  . ABDOMINAL HYSTERECTOMY  1980s   laparoscopic  . CARDIAC CATHETERIZATION N/A 12/30/2015   Procedure: Left Heart Cath and Coronary Angiography;  Surgeon: Burnell Blanks, MD;  Location: [place] [facility];  Service: Cardiovascular;  Laterality:  N/A;  . CARDIAC CATHETERIZATION N/A 12/30/2015   Procedure: Coronary Stent Intervention;  Surgeon: Burnell Blanks, MD;  Location: [place] [facility];  Service: Cardiovascular;  Laterality: N/A;  . CORONARY ANGIOPLASTY WITH STENT PLACEMENT  08/17/2009   "1 stent"  . TUBAL LIGATION  ~ 1980    OB History    No data available       Home Medications    Prior to Admission medications   Medication Sig Start Date End Date Taking? Authorizing Provider  AMBULATORY NON FORMULARY MEDICATION Take 90 mg by mouth 2 (two) times daily. Medication Name: BRILINTA 90 mg BID (TWILIGHT Research study PROVIDED) 12/30/15  Yes Burnell Blanks, MD  AMBULATORY NON FORMULARY MEDICATION Take 81 mg by mouth daily. Medication Name: ASPIRIN 81 mg Daily Or PLACEBO (TWILIGHT Research Study) 04/06/16  Yes Burnell Blanks, MD  lovastatin (MEVACOR) 40 MG tablet Take 1 tablet (40 mg total) by mouth at bedtime. 01/23/16  Yes Burnell Blanks, MD  metoprolol succinate (TOPROL XL) 25 MG 24 hr tablet Take 0.5 tablets (12.5 mg total) by mouth daily. 01/30/16  Yes Imogene Burn, PA-C  nitroGLYCERIN (NITROSTAT) 0.4 MG SL tablet Place 0.4 mg under the tongue every 5 (five) minutes as needed for chest pain. 3 DOSES MAX   Yes Historical Provider, MD    Family History Family History  Problem Relation Age of Onset  . Heart attack Mother   . Coronary artery disease    . Heart disease Father     bypass  . Heart disease Brother     bypass  Social History Social History  Substance Use Topics  . Smoking status: Former Smoker    Packs/day: 0.50    Years: 33.00    Types: Cigarettes    Quit date: 05/07/2013  . Smokeless tobacco: Never Used  . Alcohol use Yes     Comment: 12/30/2015 "might have a drink once/year"     Allergies   Atorvastatin   Review of Systems Review of Systems  Constitutional: Positive for chills and fever.  Cardiovascular: Positive for chest pain.  Gastrointestinal:  Positive for diarrhea. Negative for vomiting.  All other systems reviewed and are negative.    Physical Exam Updated Vital Signs BP 104/64   Pulse 85   Temp 98.7 F (37.1 C) (Oral)   Resp 11   Ht 5\' 3"  (1.6 m)   Wt 140 lb (63.5 kg)   SpO2 96%   BMI 24.80 kg/m   Physical Exam  Constitutional: She is oriented to person, place, and time. She appears well-developed and well-nourished.  HENT:  Head: Normocephalic and atraumatic.  Eyes: EOM are normal. Pupils are equal, round, and reactive to light. No scleral icterus.  Neck: Neck supple. No JVD present.  Cardiovascular: Normal rate and regular rhythm.  Exam reveals no gallop and no friction rub.   No murmur heard. Pulmonary/Chest: Effort normal and breath sounds normal.  Lungs clear.  Abdominal: Soft. There is no tenderness.  Musculoskeletal: Normal range of motion.  No anterior chest tenderness. Back medial to scapula tenderness.  Neurological: She is alert and oriented to person, place, and time.  Skin: Skin is warm and dry.  Psychiatric: She has a normal mood and affect.     ED Treatments / Results  DIAGNOSTIC STUDIES: Oxygen Saturation is 100% on RA, normal by my interpretation.    COORDINATION OF CARE: 9:00 AM Discussed treatment plan with pt at bedside, which includes EKG, and pt agreed to plan.   Labs (all labs ordered are listed, but only abnormal results are displayed) Labs Reviewed  BASIC METABOLIC PANEL - Abnormal; Notable for the following:       Result Value   Creatinine, Ser 1.07 (*)    Calcium 8.7 (*)    GFR calc non Af Amer 53 (*)    All other components within normal limits  CBC  TROPONIN I    EKG  EKG Interpretation  Date/Time:  Sunday June 17 2016 08:47:16 EST Ventricular Rate:  91 PR Interval:  134 QRS Duration: 74 QT Interval:  352 QTC Calculation: 432 R Axis:   26 Text Interpretation:  Normal sinus rhythm Low voltage QRS Nonspecific ST and T wave abnormality Abnormal ECG  Confirmed by Angel Chapel  MD, Angel Rivera [phone]) on 06/17/2016 8:55:50 AM       Radiology Dg Chest 2 View  Result Date: 06/17/2016 CLINICAL DATA:  Cough and chest pain for 3 days non smoker surg-2 stent HTN EXAM: CHEST  2 VIEW COMPARISON:  08/17/2009 FINDINGS: Normal mediastinum and cardiac silhouette. Normal pulmonary vasculature. No evidence of effusion, infiltrate, or pneumothorax. No acute bony abnormality. IMPRESSION: No acute cardiopulmonary process. Electronically Signed   By: Suzy Bouchard M.D.   On: 06/17/2016 09:29    Procedures Procedures (including critical care time)  Medications Ordered in ED Medications  nitroGLYCERIN (NITROSTAT) SL tablet 0.4 mg (0.4 mg Sublingual Given 06/17/16 0924)     Initial Impression / Assessment and Plan / ED Course  I have reviewed the triage vital signs and the nursing notes.  Pertinent labs & imaging  results that were available during my care of the patient were reviewed by me and considered in my medical decision making (see chart for details).     Patient presents with cough and URI symptoms. May have had some low-grade fevers. Started Friday. Now she has developed some pain in her chest and goes to the left back by her scapula. Worse with coughing. This is however the similar pain that is her anginal equivalent. Reviewed notes by Dr. Angelena Form they state that her anginal equivalent is left scapular pain. EKG is overall reassuring. Enzymes are negative however with this pain like her previous angina I think she would benefit from observation. Has known coronary disease  Final Clinical Impressions(s) / ED Diagnoses   Final diagnoses:  Chest pain, unspecified type  Upper respiratory tract infection, unspecified type    New Prescriptions New Prescriptions   No medications on file   I personally performed the services described in this documentation, which was scribed in my presence. The recorded information has been reviewed and is  accurate.      Angel Belling, MD 06/17/16 1039

## 2016-06-17 NOTE — ED Notes (Signed)
EKG given to Dr Pickering 

## 2016-06-17 NOTE — ED Triage Notes (Signed)
Patient states flu like symptoms since Friday. States chest pain that started today. States pain radiates from central chest to left back. States history of MI with Cath and stent placement in 2017.

## 2016-06-18 DIAGNOSIS — R0789 Other chest pain: Secondary | ICD-10-CM | POA: Diagnosis not present

## 2016-06-18 DIAGNOSIS — R071 Chest pain on breathing: Secondary | ICD-10-CM | POA: Diagnosis not present

## 2016-06-18 DIAGNOSIS — I2510 Atherosclerotic heart disease of native coronary artery without angina pectoris: Secondary | ICD-10-CM | POA: Diagnosis not present

## 2016-06-18 MED ORDER — OSELTAMIVIR PHOSPHATE 75 MG PO CAPS: 75.0000 mg | ORAL_CAPSULE | Freq: Two times a day (BID) | ORAL | 0 refills | Status: DC

## 2016-06-18 NOTE — Care Management Note (Signed)
Case Management Note  Patient Details  Name: Angel CATALLO MRN: [id number] Date of Birth: 1950/11/29  Subjective/Objective:                  Pt admitted with CP and flu. Chart reviewed for CM needs. PT from home, ind with ADL's. Pt has PCP. Pt has transportation. Pt has insurance with drug coverage and no difficulty affording or managing medications. Pt has no HH or DME needs PTA.   Action/Plan: Pt plans to return home with self care. No CM needs anticipated.   Expected Discharge Date:  06/19/16               Expected Discharge Plan:  Home/Self Care  In-House Referral:  NA  Discharge planning Services  CM Consult  Post Acute Care Choice:  NA Choice offered to:  NA  Status of Service:  Completed, signed off  Sherald Barge, RN 06/18/2016, 9:46 AM

## 2016-06-18 NOTE — Care Management Obs Status (Signed)
[facility] NOTIFICATION   Patient Details  Name: AZARA COBEY MRN: [id number] Date of Birth: [birth year]   Medicare Observation Status Notification Given:  Yes    Sherald Barge, RN 06/18/2016, 9:45 AM

## 2016-06-18 NOTE — Consult Note (Addendum)
Primary cardiologist: Dr Darlina Guys Consulting cardiologist: Dr Carlyle Dolly Requesting: Dr Isaac Bliss Indication: chest pain    Clinical Summary Ms. Beare is a 66 y.o.female history of CAD with NSTEMI in 2011 s/p DES to RCA with moderate disease in LAD, LCX, and prox RCA medically managed that the time. Repeat cath 12/2015 with upper back pain and left scapular pain which is described as her anginal equivalent, received another DES to RCA. She presents with myaglias, cough, and chest pain  Chest pain started yesterday morning while at rest. Squeeing like feeling in mid chest, lasts a few minutes and then resolves. Worst with deep breathing. Reports several day history of severe productive cough, myalgias. Flu + during this admission.    K 3.8 Cr 1.07 Hgb 13.8 Plt 239  Trop neg x 4 Influenza B positive CXR no acute process EKG SR, nonspecific ST/T changes    Allergies  Allergen Reactions  . Atorvastatin Other (See Comments)    Leg pain    Medications Scheduled Medications: . aspirin  81 mg Oral Daily  . enoxaparin (LOVENOX) injection  40 mg Subcutaneous Q24H  . metoprolol succinate  12.5 mg Oral Daily  . oseltamivir  75 mg Oral BID  . pravastatin  40 mg Oral q1800  . ticagrelor  90 mg Oral BID     Infusions:   PRN Medications:  acetaminophen, gi cocktail, guaiFENesin-dextromethorphan, nitroGLYCERIN, ondansetron (ZOFRAN) IV   Past Medical History:  Diagnosis Date  . Coronary artery disease    NATIVE VESSEL (ICD-414.01)-cath 08/17/09 with subtotally occluded RCA, moderate disease LAD.  Marland Kitchen Hyperlipidemia   . Hypotension   . Myocardial infarction 08/17/2009  . Tobacco user     Past Surgical History:  Procedure Laterality Date  . ABDOMINAL HYSTERECTOMY  1980s   laparoscopic  . CARDIAC CATHETERIZATION N/A 12/30/2015   Procedure: Left Heart Cath and Coronary Angiography;  Surgeon: Burnell Blanks, MD;  Location: [facility];  Service:  Cardiovascular;  Laterality: N/A;  . CARDIAC CATHETERIZATION N/A 12/30/2015   Procedure: Coronary Stent Intervention;  Surgeon: Burnell Blanks, MD;  Location: [facility];  Service: Cardiovascular;  Laterality: N/A;  . CORONARY ANGIOPLASTY WITH STENT PLACEMENT  08/17/2009   "1 stent"  . TUBAL LIGATION  ~ 1980    Family History  Problem Relation Age of Onset  . Heart attack Mother   . Coronary artery disease    . Heart disease Father     bypass  . Heart disease Brother     bypass    Social History Ms. Inglett reports that she quit smoking about 3 years ago. Her smoking use included Cigarettes. She has a 16.50 pack-year smoking history. She has never used smokeless tobacco. Ms. Trzaska reports that she drinks alcohol.  Review of Systems CONSTITUTIONAL: No weight loss, fever, chills, weakness or fatigue.  HEENT: Eyes: No visual loss, blurred vision, double vision or yellow sclerae. No hearing loss, sneezing, congestion, runny nose or sore throat.  SKIN: No rash or itching.  CARDIOVASCULAR: per hpi RESPIRATORY: +cough GASTROINTESTINAL: No anorexia, nausea, vomiting or diarrhea. No abdominal pain or blood.  GENITOURINARY: no polyuria, no dysuria NEUROLOGICAL: No headache, dizziness, syncope, paralysis, ataxia, numbness or tingling in the extremities. No change in bowel or bladder control.  MUSCULOSKELETAL: muscle aches HEMATOLOGIC: No anemia, bleeding or bruising.  LYMPHATICS: No enlarged nodes. No history of splenectomy.  PSYCHIATRIC: No history of depression or anxiety.      Physical Examination Blood  pressure 120/71, pulse 84, temperature 98.2 F (36.8 C), temperature source Oral, resp. rate 20, height 5\' 3"  (1.6 m), weight 140 lb (63.5 kg), SpO2 98 %.  Intake/Output Summary (Last 24 hours) at 06/18/16 1018 Last data filed at 06/18/16 0612  Gross per 24 hour  Intake              240 ml  Output             1000 ml  Net             -760 ml    HEENT: sclera  clear, throat clear  Cardiovascular: RRR, no m/rg, no jvd  Respiratory: CTAB  GI: abdomen soft, NT, ND  MSK: no LE edema  Neuro: no focal deficits  Psych: appropriate affect   Lab Results  Basic Metabolic Panel:  Recent Labs Lab 06/17/16 0858  NA 137  K 3.8  CL 107  CO2 22  GLUCOSE 96  BUN 12  CREATININE 1.07*  CALCIUM 8.7*    Liver Function Tests: No results for input(s): AST, ALT, ALKPHOS, BILITOT, PROT, ALBUMIN in the last 168 hours.  CBC:  Recent Labs Lab 06/17/16 0858  WBC 5.6  HGB 13.8  HCT 40.4  MCV 96.4  PLT 239    Cardiac Enzymes:  Recent Labs Lab 06/17/16 0858 06/17/16 1313 06/17/16 1641 06/17/16 1859  TROPONINI <0.03 <0.03 <0.03 <0.03    BNP: Invalid input(s): POCBNP      Recommendations 1. CAD/chest pain - presents with atypical positional chest pain in setting of influenza and severe cough. - no objective evidence of ACS based on EKG or enzymes - at this time suspect her chest pain is related to respiratory infection and severe cough. Do not recommend ischemic testing  - did not tolerate atorva, tolerating mevacor. Did not tolerate coreg, has been able to take low dose Toprol.  - continue current cardiac meds including DAPT given recent stent    We will sign off inpatient care.  Carlyle Dolly, M.D.

## 2016-06-18 NOTE — Consult Note (Signed)
  Entered in error   Carlyle Dolly MD

## 2016-06-18 NOTE — Progress Notes (Signed)
Dr. Darrick Meigs notified of junctional rhythm per tele. Patient asymptomatic. No new orders at this time.

## 2016-06-18 NOTE — Progress Notes (Signed)
Discharge instructions and prescription given, verbalized understanding, out in stable condition via w/c with staff. 

## 2016-06-18 NOTE — Discharge Summary (Signed)
Physician Discharge Summary  Angel Rivera [id number] DOB: 27-Nov-1950 DOA: 06/17/2016  PCP: Robert Bellow, MD  Admit date: 06/17/2016 Discharge date: 06/18/2016  Time spent: 45 minutes  Recommendations for Outpatient Follow-up:  -Will be discharged home today. -Advised to follow up with PCP in 2 weeks.   Discharge Diagnoses:  Active Problems:   Chest pain   Discharge Condition: Stable and improved  Filed Weights   06/17/16 0844  Weight: 63.5 kg (140 lb)    History of present illness:  Angel Rivera is a 66 y.o. female with a history of coronary artery disease with most recent cath in August 2017 that showed triple-vessel coronary artery disease with moderate stenosis in the mid LAD, moderate stenosis of the mid circumflex, severe stenosis of the mid RCA just before the old stent, status post PTCA and drug-eluting stent 1. She was enrolled in the twilight study which consists of aspirin and Brilinta for 3 months followed by randomization to Brilinta alone or aspirin and Brilinta. She presents today with centralized chest pain that began earlier today while she was sitting at home. Pain radiated to her left shoulder and stated that pain felt similar to when she had her MI in 2013. She also admits to having URI symptoms with subjective fever and chills, cough, diarrhea and sore throat. She has had sick contacts including her neighbor. Admission has been requested. In the ED initial troponin is negative and EKG does not show any acute changes.  Hospital Course:   Chest Pain -Symptoms seem mostly respiratory in nature and she has tested positive for flu. -Has ruled out for ACS. -Seen by cardiology without plans for further cardiac work up. -OK for DC home today.  Influenza B -Continue tamiflu for 5 days total.  Procedures:  None   Consultations:  Cardiology  Discharge Instructions  Discharge Instructions    Diet - low sodium heart healthy    Complete by:  As  directed    Increase activity slowly    Complete by:  As directed      Allergies as of 06/18/2016      Reactions   Atorvastatin Other (See Comments)   Leg pain      Medication List    TAKE these medications   AMBULATORY NON FORMULARY MEDICATION Take 90 mg by mouth 2 (two) times daily. Medication Name: BRILINTA 90 mg BID (TWILIGHT Research study PROVIDED)   AMBULATORY NON FORMULARY MEDICATION Take 81 mg by mouth daily. Medication Name: ASPIRIN 81 mg Daily Or PLACEBO (TWILIGHT Research Study)   lovastatin 40 MG tablet Commonly known as:  MEVACOR Take 1 tablet (40 mg total) by mouth at bedtime.   metoprolol succinate 25 MG 24 hr tablet Commonly known as:  TOPROL XL Take 0.5 tablets (12.5 mg total) by mouth daily.   nitroGLYCERIN 0.4 MG SL tablet Commonly known as:  NITROSTAT Place 0.4 mg under the tongue every 5 (five) minutes as needed for chest pain. 3 DOSES MAX   oseltamivir 75 MG capsule Commonly known as:  TAMIFLU Take 1 capsule (75 mg total) by mouth 2 (two) times daily.      Allergies  Allergen Reactions  . Atorvastatin Other (See Comments)    Leg pain   Follow-up Information    Robert Bellow, MD Follow up on 07/02/2016.   Specialty:  Family Medicine Why:  At 11:30am Contact information: [place] [address] [phone]  The results of significant diagnostics from this hospitalization (including imaging, microbiology, ancillary and laboratory) are listed below for reference.    Significant Diagnostic Studies: Dg Chest 2 View  Result Date: 06/17/2016 CLINICAL DATA:  Cough and chest pain for 3 days non smoker surg-2 stent HTN EXAM: CHEST  2 VIEW COMPARISON:  08/17/2009 FINDINGS: Normal mediastinum and cardiac silhouette. Normal pulmonary vasculature. No evidence of effusion, infiltrate, or pneumothorax. No acute bony abnormality. IMPRESSION: No acute cardiopulmonary process. Electronically Signed   By: Suzy Bouchard  M.D.   On: 06/17/2016 09:29    Microbiology: No results found for this or any previous visit (from the past 240 hour(s)).   Labs: Basic Metabolic Panel:  Recent Labs Lab 06/17/16 0858  NA 137  K 3.8  CL 107  CO2 22  GLUCOSE 96  BUN 12  CREATININE 1.07*  CALCIUM 8.7*   Liver Function Tests: No results for input(s): AST, ALT, ALKPHOS, BILITOT, PROT, ALBUMIN in the last 168 hours. No results for input(s): LIPASE, AMYLASE in the last 168 hours. No results for input(s): AMMONIA in the last 168 hours. CBC:  Recent Labs Lab 06/17/16 0858  WBC 5.6  HGB 13.8  HCT 40.4  MCV 96.4  PLT 239   Cardiac Enzymes:  Recent Labs Lab 06/17/16 0858 06/17/16 1313 06/17/16 1641 06/17/16 1859  TROPONINI <0.03 <0.03 <0.03 <0.03   BNP: BNP (last 3 results) No results for input(s): BNP in the last 8760 hours.  ProBNP (last 3 results) No results for input(s): PROBNP in the last 8760 hours.  CBG: No results for input(s): GLUCAP in the last 168 hours.     SignedLelon Frohlich  [facility] Pager: [phone] 06/18/2016, 5:07 PM

## 2016-07-02 DIAGNOSIS — J111 Influenza due to unidentified influenza virus with other respiratory manifestations: Secondary | ICD-10-CM | POA: Diagnosis not present

## 2016-07-02 DIAGNOSIS — Z23 Encounter for immunization: Secondary | ICD-10-CM | POA: Diagnosis not present

## 2016-07-23 ENCOUNTER — Other Ambulatory Visit: Payer: Self-pay | Admitting: Cardiovascular Disease

## 2016-07-23 MED ORDER — METOPROLOL SUCCINATE ER 25 MG PO TB24: 12.5000 mg | ORAL_TABLET | Freq: Every day | ORAL | 2 refills | Status: DC

## 2016-10-22 ENCOUNTER — Encounter: Payer: Self-pay | Admitting: *Deleted

## 2016-10-22 DIAGNOSIS — Z006 Encounter for examination for normal comparison and control in clinical research program: Secondary | ICD-10-CM

## 2016-10-22 NOTE — Progress Notes (Signed)
TWILIGHT Research study month 9 follow up visit completed. Patient denies any bleeding events. She has been 94 % compliant with ASA/Placebo and 96 % compliant with Brilinta. Dispensed the following bottle #'s [id number]; [id number]; [id number]. She has a lot going on at home, her 67 yo grandson has been in ICU with trach and vent for 57 days. He has been discharged home and needs lots of care. Her next research required visit is due before 18/DEC/2018. At that visit she will no longer receive ASA/placebo or brilinta. Further antiplatelet therapy will be at the discretion of her cardiologist. Questions were encouraged and answered.

## 2017-01-15 DIAGNOSIS — G4700 Insomnia, unspecified: Secondary | ICD-10-CM | POA: Diagnosis not present

## 2017-01-15 DIAGNOSIS — I2510 Atherosclerotic heart disease of native coronary artery without angina pectoris: Secondary | ICD-10-CM | POA: Diagnosis not present

## 2017-01-15 DIAGNOSIS — L989 Disorder of the skin and subcutaneous tissue, unspecified: Secondary | ICD-10-CM | POA: Diagnosis not present

## 2017-01-15 DIAGNOSIS — R197 Diarrhea, unspecified: Secondary | ICD-10-CM | POA: Diagnosis not present

## 2017-01-15 DIAGNOSIS — Z7901 Long term (current) use of anticoagulants: Secondary | ICD-10-CM | POA: Diagnosis not present

## 2017-01-15 DIAGNOSIS — R5383 Other fatigue: Secondary | ICD-10-CM | POA: Diagnosis not present

## 2017-01-21 ENCOUNTER — Other Ambulatory Visit: Payer: Self-pay | Admitting: Cardiovascular Disease

## 2017-01-21 DIAGNOSIS — D0471 Carcinoma in situ of skin of right lower limb, including hip: Secondary | ICD-10-CM | POA: Diagnosis not present

## 2017-01-21 DIAGNOSIS — D225 Melanocytic nevi of trunk: Secondary | ICD-10-CM | POA: Diagnosis not present

## 2017-01-21 DIAGNOSIS — L570 Actinic keratosis: Secondary | ICD-10-CM | POA: Diagnosis not present

## 2017-01-21 DIAGNOSIS — X32XXXA Exposure to sunlight, initial encounter: Secondary | ICD-10-CM | POA: Diagnosis not present

## 2017-01-22 ENCOUNTER — Other Ambulatory Visit: Payer: Self-pay | Admitting: Cardiovascular Disease

## 2017-02-25 ENCOUNTER — Other Ambulatory Visit (HOSPITAL_COMMUNITY): Payer: Self-pay | Admitting: Family Medicine

## 2017-02-25 DIAGNOSIS — I2510 Atherosclerotic heart disease of native coronary artery without angina pectoris: Secondary | ICD-10-CM | POA: Diagnosis not present

## 2017-02-25 DIAGNOSIS — G4700 Insomnia, unspecified: Secondary | ICD-10-CM | POA: Diagnosis not present

## 2017-02-25 DIAGNOSIS — Z23 Encounter for immunization: Secondary | ICD-10-CM | POA: Diagnosis not present

## 2017-02-25 DIAGNOSIS — Z1231 Encounter for screening mammogram for malignant neoplasm of breast: Secondary | ICD-10-CM

## 2017-02-25 DIAGNOSIS — R197 Diarrhea, unspecified: Secondary | ICD-10-CM | POA: Diagnosis not present

## 2017-02-26 ENCOUNTER — Telehealth: Payer: Self-pay | Admitting: Cardiovascular Disease

## 2017-02-26 NOTE — Telephone Encounter (Signed)
New Message   *STAT* If patient is at the pharmacy, call can be transferred to refill team.   1. Which medications need to be refilled? (please list name of each medication and dose if known) Lovastatin 40mg  and Metoprolol 25mg    2. Which pharmacy/location (including street and city if local pharmacy) is medication to be sent to? [facility] [place]  3. Do they need a 30 day or 90 day supply? Angel Rivera

## 2017-02-26 NOTE — Telephone Encounter (Signed)
Both of these medications have refills through at least December. I have called the patient several times to let her know but have been unsuccessful in reaching her.

## 2017-03-04 ENCOUNTER — Ambulatory Visit (HOSPITAL_COMMUNITY): Payer: PPO

## 2017-03-04 DIAGNOSIS — Z08 Encounter for follow-up examination after completed treatment for malignant neoplasm: Secondary | ICD-10-CM | POA: Diagnosis not present

## 2017-03-04 DIAGNOSIS — B078 Other viral warts: Secondary | ICD-10-CM | POA: Diagnosis not present

## 2017-03-04 DIAGNOSIS — Z85828 Personal history of other malignant neoplasm of skin: Secondary | ICD-10-CM | POA: Diagnosis not present

## 2017-03-08 ENCOUNTER — Ambulatory Visit (HOSPITAL_COMMUNITY): Payer: PPO

## 2017-03-11 ENCOUNTER — Ambulatory Visit (HOSPITAL_COMMUNITY)
Admission: RE | Admit: 2017-03-11 | Discharge: 2017-03-11 | Disposition: A | Discharge status: Home / Self Care | Payer: PPO | Source: Ambulatory Visit | Attending: Family Medicine | Admitting: Family Medicine

## 2017-03-11 DIAGNOSIS — Z1231 Encounter for screening mammogram for malignant neoplasm of breast: Secondary | ICD-10-CM | POA: Diagnosis not present

## 2017-03-12 ENCOUNTER — Encounter: Payer: Self-pay | Admitting: Gastroenterology

## 2017-03-18 ENCOUNTER — Ambulatory Visit (HOSPITAL_COMMUNITY): Payer: PPO

## 2017-04-08 ENCOUNTER — Encounter: Payer: PPO | Admitting: *Deleted

## 2017-04-08 DIAGNOSIS — Z006 Encounter for examination for normal comparison and control in clinical research program: Secondary | ICD-10-CM

## 2017-04-08 MED ORDER — ASPIRIN EC 81 MG PO TBEC: 81.0000 mg | DELAYED_RELEASE_TABLET | Freq: Every day | ORAL | 3 refills | Status: DC

## 2017-04-08 MED ORDER — CLOPIDOGREL BISULFATE 75 MG PO TABS: 75.0000 mg | ORAL_TABLET | Freq: Every day | ORAL | 3 refills | Status: DC

## 2017-04-08 NOTE — Progress Notes (Signed)
TWILIGHT Research study month 15 follow up visit completed. Patient has been 100 % compliant with ASA/placebo and 98 % compliant with Brilinta. Patient has 2 bruises R upper arm the size of a lemon and Left inner thigh. The begin date was 04/02/17 and end date 04/07/17. She has been prescribed Plavix 75 mg daily and ASA 81 mg daily. I will send script to pharmacy. She has 1 remaining phone call and the window for that is 06/08/16-07/06/16.

## 2017-04-23 ENCOUNTER — Other Ambulatory Visit: Payer: Self-pay | Admitting: Cardiovascular Disease

## 2017-04-23 MED ORDER — METOPROLOL SUCCINATE ER 25 MG PO TB24: 12.5000 mg | ORAL_TABLET | Freq: Every day | ORAL | 0 refills | Status: DC

## 2017-04-23 MED ORDER — LOVASTATIN 40 MG PO TABS: 40.0000 mg | ORAL_TABLET | Freq: Every day | ORAL | 0 refills | Status: DC

## 2017-04-23 NOTE — Telephone Encounter (Signed)
Pt's medication was sent to pt's pharmacy as requested. Confirmation received.  °

## 2017-05-06 ENCOUNTER — Ambulatory Visit: Payer: PPO | Admitting: Nurse Practitioner

## 2017-05-16 ENCOUNTER — Encounter: Payer: Self-pay | Admitting: Nurse Practitioner

## 2017-05-16 ENCOUNTER — Ambulatory Visit: Payer: PPO | Admitting: Nurse Practitioner

## 2017-05-16 DIAGNOSIS — Z1159 Encounter for screening for other viral diseases: Secondary | ICD-10-CM | POA: Diagnosis not present

## 2017-05-16 DIAGNOSIS — Z9189 Other specified personal risk factors, not elsewhere classified: Secondary | ICD-10-CM

## 2017-05-16 DIAGNOSIS — R197 Diarrhea, unspecified: Secondary | ICD-10-CM | POA: Insufficient documentation

## 2017-05-16 NOTE — Assessment & Plan Note (Signed)
The patient previously was having significant diarrhea.  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  She was also treated with Flagyl.  Whether it was giving up babysitting and subsequent decreased and that stress and anxiety versus antibiotics, her diarrhea has now resolved.  Generally she is asymptomatic from a GI standpoint.  Return for follow-up as needed.  Of note she is due for her first ever colonoscopy as she has never had one.  She declines a colonoscopy at that time saying "when you have a pill that I can take the prep then I will consider it."  I discussed the risks and benefits of not receiving a colonoscopy including advanced colon cancer that can be asymptomatic for many years.  She verbalized understanding.  I requested she think about it and give [place] a call back for phone triage if she decides to proceed with colonoscopy.

## 2017-05-16 NOTE — Progress Notes (Signed)
CC'D TO PCP °

## 2017-05-16 NOTE — Assessment & Plan Note (Signed)
The patient is high risk for hepatitis C given her birth year as she is included in the high risk H cohort born between 64-1966.  She is never been screened for hepatitis C.  I offered her a one-time screening for hepatitis C and explained no further screening would be necessary unless she engages in high risk behavior such as IV drug use.  She is agreed to screening.  I will order a hep C antibody.  We will call her with results.  Return for follow-up as needed or if positive hepatitis C.

## 2017-05-16 NOTE — Progress Notes (Addendum)
REVIEWED-NO ADDITIONAL RECOMMENDATIONS.  Primary Care Physician:  Lemmie Evens, MD Primary Gastroenterologist:  Dr. Oneida Alar  Chief Complaint  Patient presents with  . Colonoscopy    screening    HPI:   Angel Rivera is a 67 y.o. female who presents in referral from primary care for diarrhea and to schedule colonoscopy.  Patient's primary care office note dated 02/25/2017 which indicates explosive diarrhea which improved after Flagyl.  Suspected Giardia as a source of her symptoms.  Recommended follow-up with GI related to diarrhea.  Also requested evaluation for screening colonoscopy.  She was brought into the office due to her symptoms.  No history of colonoscopy found in our system.  Today she states she's doing well. Her diarrhea was explosive and lasted about a month. She was treated with antibiotics which helped. 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 She caused a lot of anxiety. When she stopped having to do this her diarrhea resolved. Last episode of diarrhea was about 2 months ago. Denies hematochezia, melena, abdominal pain, fever, chills, unintentional weight loss. Denies chest pain, dyspnea, dizziness, lightheadedness, syncope, near syncope. Denies any other upper or lower GI symptoms.  She is on Plavix for cardiac stents x 2 (April 2011 and Aug 2017).  Past Medical History:  Diagnosis Date  . Coronary artery disease    NATIVE VESSEL (ICD-414.01)-cath 08/17/09 with subtotally occluded RCA, moderate disease LAD.  Marland Kitchen Hyperlipidemia   . Hypotension   . Myocardial infarction ([place]) 08/17/2009  . Tobacco user     Past Surgical History:  Procedure Laterality Date  . ABDOMINAL HYSTERECTOMY  1980s   laparoscopic  . CARDIAC CATHETERIZATION N/A 12/30/2015   Procedure: Left Heart Cath and Coronary Angiography;  Surgeon: Burnell Blanks, MD;  Location: [facility];  Service:  Cardiovascular;  Laterality: N/A;  . CARDIAC CATHETERIZATION N/A 12/30/2015   Procedure: Coronary Stent Intervention;  Surgeon: Burnell Blanks, MD;  Location: [facility];  Service: Cardiovascular;  Laterality: N/A;  . CORONARY ANGIOPLASTY WITH STENT PLACEMENT  08/17/2009   "1 stent"  . TUBAL LIGATION  ~ 1980    Current Outpatient Medications  Medication Sig Dispense Refill  . aspirin EC 81 MG tablet Take 1 tablet (81 mg total) by mouth daily. 90 tablet 3  . clopidogrel (PLAVIX) 75 MG tablet Take 1 tablet (75 mg total) by mouth daily. 90 tablet 3  . lovastatin (MEVACOR) 40 MG tablet Take 1 tablet (40 mg total) by mouth at bedtime. Please keep upcoming appt for future refills. Thank you 90 tablet 0  . metoprolol succinate (TOPROL XL) 25 MG 24 hr tablet Take 0.5 tablets (12.5 mg total) by mouth daily. Please keep upcoming appt for future refills. Thank you 45 tablet 0  . nitroGLYCERIN (NITROSTAT) 0.4 MG SL tablet Place 0.4 mg under the tongue every 5 (five) minutes as needed for chest pain. 3 DOSES MAX    . oseltamivir (TAMIFLU) 75 MG capsule Take 1 capsule (75 mg total) by mouth 2 (two) times daily. (Patient not taking: Reported on 05/16/2017) 9 capsule 0   No current facility-administered medications for this visit.     Allergies as of 05/16/2017 - Review Complete 05/16/2017  Allergen Reaction Noted  . Atorvastatin Other (See Comments) 01/23/2016    Family History  Problem Relation Age of Onset  . Heart attack Mother   . Coronary artery disease  Unknown   . Heart disease Father        bypass  . Heart disease Brother        bypass    Social History   Socioeconomic History  . Marital status: Divorced    Spouse name: Not on file  . Number of children: Not on file  . Years of education: Not on file  . Highest education level: Not on file  Social Needs  . Financial resource strain: Not on file  . Food insecurity - worry: Not on file  . Food insecurity - inability:  Not on file  . Transportation needs - medical: Not on file  . Transportation needs - non-medical: Not on file  Occupational History  . Not on file  Tobacco Use  . Smoking status: Former Smoker    Packs/day: 0.50    Years: 33.00    Pack years: 16.50    Types: Cigarettes    Last attempt to quit: 05/07/2013    Years since quitting: 4.0  . Smokeless tobacco: Never Used  Substance and Sexual Activity  . Alcohol use: Yes    Comment: 12/30/2015 "might have a drink once/year"  . Drug use: No  . Sexual activity: Not on file  Other Topics Concern  . Not on file  Social History Narrative   The patient has a almost 1-pack per day tobacco use     history.     She occasionally has alcohol-no abuse.     No illicit drug use   Divorced, 2 children    Review of Systems: Complete ROS negative except as per HPI.   Physical Exam: BP 125/75   Pulse 78   Temp (!) 97.4 F (36.3 C) (Oral)   Ht 5\' 3"  (1.6 m)   Wt 145 lb 6.4 oz (66 kg)   BMI 25.76 kg/m  General:   Alert and oriented. Pleasant and cooperative. Well-nourished and well-developed.  Eyes:  Without icterus, sclera clear and conjunctiva pink.  Ears:  Normal auditory acuity. Cardiovascular:  S1, S2 present without murmurs appreciated. Extremities without clubbing or edema. Respiratory:  Clear to auscultation bilaterally. No wheezes, rales, or rhonchi. No distress.  Gastrointestinal:  +BS, soft, non-tender and non-distended. No HSM noted. No guarding or rebound. No masses appreciated.  Rectal:  Deferred  Musculoskalatal:  Symmetrical without gross deformities. Neurologic:  Alert and oriented x4;  grossly normal neurologically. Psych:  Alert and cooperative. Normal mood and affect. Heme/Lymph/Immune: No excessive bruising noted.    05/16/2017 10:37 AM   Disclaimer: This note was dictated with voice recognition software. Similar sounding words can inadvertently be transcribed and may not be corrected upon review.

## 2017-05-16 NOTE — Patient Instructions (Signed)
1. Have your blood test done when you are able to check for hepatitis C infection. 2. We will call you with the results from we receive them. 3. Consider having a colonoscopy due to the value and screening of colon cancer which is often without symptoms until far advanced disease. 4. Return for follow-up as needed for any recurrent or new GI symptoms. 5. Call if you have any questions or concerns.

## 2017-05-17 LAB — HEPATITIS C ANTIBODY
Hepatitis C Ab: NONREACTIVE
SIGNAL TO CUT-OFF: 0.01 (ref <1.00)

## 2017-05-20 ENCOUNTER — Ambulatory Visit: Payer: PPO | Admitting: Cardiovascular Disease

## 2017-05-30 ENCOUNTER — Telehealth: Payer: Self-pay | Admitting: Gastroenterology

## 2017-05-30 NOTE — Telephone Encounter (Signed)
[place], SHE IS INQUIRING ABOUT HER HEPATITIS LABS SHE HAD DRAWN A FEW WEEKS AGO

## 2017-05-30 NOTE — Telephone Encounter (Signed)
See lab results note. Pt is aware.

## 2017-06-13 ENCOUNTER — Encounter: Payer: Self-pay | Admitting: Cardiology

## 2017-06-13 ENCOUNTER — Ambulatory Visit: Payer: PPO | Admitting: Cardiology

## 2017-06-13 VITALS — BP 114/68 | HR 68 | Ht 63.5 in | Wt 146.8 lb

## 2017-06-13 DIAGNOSIS — I2510 Atherosclerotic heart disease of native coronary artery without angina pectoris: Secondary | ICD-10-CM

## 2017-06-13 DIAGNOSIS — E785 Hyperlipidemia, unspecified: Secondary | ICD-10-CM

## 2017-06-13 MED ORDER — NITROGLYCERIN 0.4 MG SL SUBL: 0.4000 mg | SUBLINGUAL_TABLET | SUBLINGUAL | 3 refills | Status: DC | PRN

## 2017-06-13 NOTE — Progress Notes (Signed)
06/13/2017 KALIMAH CAPURRO   [birth year]  [id number]  Primary Physician Lemmie Evens, MD Primary Cardiologist: Dr. Angelena Form   Reason for Visit/CC: Routine yearly f/u for CAD and HLD  HPI:  Angel Rivera is a 67 y.o. female, followed by Dr. Angelena Form, who presents to clinic today for routine check-up. She has a h/o CAD, hyperlipidemia and tobacco abuse.  She was admitted to [facility] August 17, 2009 with a NSTEMI secondary to subtotally occluded distal RCA which was treated with a drug eluting stent. She was also found to have moderate disease in the mid LAD (50%), mid Circumflex (40%), proximal RCA (40%). She stopped smoking February 2015. Dr. Odessa Fleming saw her again in August 2017 and she c/o pain in her upper back which was her prior anginal equivalent. Repeat cardiac cath 12/30/15 showed severe stenosis in the mid RCA treated with drug eluting stent. There was stable moderate disease in the mid LAD and mid Circumflex. She was placed on ASA and Brilinta and enrolled in the [place]. She is now on ASA and Plavix. Also on BB and statin. Her last FLP in 03/2016 showed controlled LDL at 71 mg/DL.   Today, she reports that she has done well. She denies any recurrent chest or back pain. No dyspnea or exertional symptoms. No other cardiac symptoms. She walks up stairs each day. Her bed and bath are on the 2nd level of her home. No limitations with this activity. She reports full med compliance. No side effects. EKG shows NSR with no ischemic abnormalities. BP is well controlled.  Pt also notes that she is planning to have Botox surgery later this month and needs clearance to hold Plavix 48 hrs before and 48 hrs after surgery (per her surgeons request).    Cardiac Studies:  Procedures   Coronary Stent Intervention  Left Heart Cath and Coronary Angiography 11/2015  Conclusion     Mid RCA to Dist RCA lesion, 5 %stenosed.  A STENT RESOLUTE INTEG 2.5X22 drug eluting stent was  successfully placed.  Mid RCA lesion, 80 %stenosed.  Post intervention, there is a 0% residual stenosis.  Mid Cx lesion, 50 %stenosed.  Mid LAD lesion, 50 %stenosed.  Dist LAD lesion, 30 %stenosed.  The left ventricular systolic function is normal.  LV end diastolic pressure is normal.  The left ventricular ejection fraction is 50-55% by visual estimate.  There is no mitral valve regurgitation.   1. Triple vessel CAD 2. Moderate stenosis in the mid LAD which is unchanged from cath in 2011. This does not appear to be flow limiting 3. Moderate stenosis mid Circumflex. This is unchanged in appearance from cath in 2011. This does not appear to be flow limiting.  4. Severe stenosis mid RCA just before the old stent. This stenosis is hazy and appears ulcerated.  5. Normal LV systolic function 6. Successful PTCA/DES x 1 mid RCA      Current Meds  Medication Sig  . aspirin EC 81 MG tablet Take 1 tablet (81 mg total) by mouth daily.  . clopidogrel (PLAVIX) 75 MG tablet Take 1 tablet (75 mg total) by mouth daily.  . furosemide (LASIX) 40 MG tablet Take 40 mg by mouth daily as needed for edema.  . lovastatin (MEVACOR) 40 MG tablet Take 1 tablet (40 mg total) by mouth at bedtime. Please keep upcoming appt for future refills. Thank you  . metoprolol succinate (TOPROL XL) 25 MG 24 hr tablet Take 0.5 tablets (12.5 mg total)  by mouth daily. Please keep upcoming appt for future refills. Thank you  . nitroGLYCERIN (NITROSTAT) 0.4 MG SL tablet Place 1 tablet (0.4 mg total) under the tongue every 5 (five) minutes as needed for chest pain. 3 DOSES MAX  . [DISCONTINUED] nitroGLYCERIN (NITROSTAT) 0.4 MG SL tablet Place 0.4 mg under the tongue every 5 (five) minutes as needed for chest pain. 3 DOSES MAX   Allergies  Allergen Reactions  . Atorvastatin Other (See Comments)    Leg pain   Past Medical History:  Diagnosis Date  . Coronary artery disease    NATIVE VESSEL (ICD-414.01)-cath 08/17/09  with subtotally occluded RCA, moderate disease LAD.  Marland Kitchen Hyperlipidemia   . Hypotension   . Myocardial infarction ([place]) 08/17/2009  . Tobacco user    Family History  Problem Relation Age of Onset  . Heart attack Mother   . Coronary artery disease Unknown   . Heart disease Father        bypass  . Heart disease Brother        bypass   Past Surgical History:  Procedure Laterality Date  . ABDOMINAL HYSTERECTOMY  1980s   laparoscopic  . CARDIAC CATHETERIZATION N/A 12/30/2015   Procedure: Left Heart Cath and Coronary Angiography;  Surgeon: Burnell Blanks, MD;  Location: [place] [facility];  Service: Cardiovascular;  Laterality: N/A;  . CARDIAC CATHETERIZATION N/A 12/30/2015   Procedure: Coronary Stent Intervention;  Surgeon: Burnell Blanks, MD;  Location: [place] [facility];  Service: Cardiovascular;  Laterality: N/A;  . CORONARY ANGIOPLASTY WITH STENT PLACEMENT  08/17/2009   "1 stent"  . TUBAL LIGATION  ~ 1980   Social History   Socioeconomic History  . Marital status: Divorced    Spouse name: Not on file  . Number of children: Not on file  . Years of education: Not on file  . Highest education level: Not on file  Social Needs  . Financial resource strain: Not on file  . Food insecurity - worry: Not on file  . Food insecurity - inability: Not on file  . Transportation needs - medical: Not on file  . Transportation needs - non-medical: Not on file  Occupational History  . Not on file  Tobacco Use  . Smoking status: Former Smoker    Packs/day: 0.50    Years: 33.00    Pack years: 16.50    Types: Cigarettes    Last attempt to quit: 05/07/2013    Years since quitting: 4.1  . Smokeless tobacco: Never Used  Substance and Sexual Activity  . Alcohol use: Yes    Comment: 12/30/2015 "might have a drink once/year"  . Drug use: No  . Sexual activity: Not on file  Other Topics Concern  . Not on file  Social History Narrative   The patient has a almost 1-pack per day  tobacco use     history.     She occasionally has alcohol-no abuse.     No illicit drug use   Divorced, 2 children     Review of Systems: General: negative for chills, fever, night sweats or weight changes.  Cardiovascular: negative for chest pain, dyspnea on exertion, edema, orthopnea, palpitations, paroxysmal nocturnal dyspnea or shortness of breath Dermatological: negative for rash Respiratory: negative for cough or wheezing Urologic: negative for hematuria Abdominal: negative for nausea, vomiting, diarrhea, bright red blood per rectum, melena, or hematemesis Neurologic: negative for visual changes, syncope, or dizziness All other systems reviewed and are otherwise negative except as  noted above.   Physical Exam:  Blood pressure 114/68, pulse 68, height 5' 3.5" (1.613 m), weight 146 lb 12.8 oz (66.6 kg).  General appearance: alert, cooperative and no distress Neck: no carotid bruit and no JVD Lungs: clear to auscultation bilaterally Heart: regular rate and rhythm, S1, S2 normal, no murmur, click, rub or gallop Extremities: extremities normal, atraumatic, no cyanosis or edema Pulses: 2+ and symmetric Skin: Skin color, texture, turgor normal. No rashes or lesions Neurologic: Grossly normal  EKG NSR. No ischemic abnormalities -- personally reviewed   ASSESSMENT AND PLAN:   1. CAD: h/o stenting to the distal RCA in 2011, followed by stenting to the mid RCA in 2017. She was also noted to have moderate nonbostructive diseseas in the LAD and LCx at time of last cath. She has remained stable w/o anginal symptoms. Continue medical therapy for secondary prevention with ASA, Plavix, statin and BB. Will check CBC given DAPT.   2. HLD: on statin therapy with Lovastatin. She is due for repeat FLP. Will order + HFTs. Goal LDL is < 70 mg/DL.   3. H/o Tobacco Abuse: pt quit in 2015.   4. Surgical Clearance: low risk surgery (Botox). Given stent placement was > 12 months ago and she has not  had any ischemic symptoms, it would be ok for her to hold Plavix 48 hrs before and 48 hrs after, per MD request.   Follow-Up w/ Dr. Angelena Form in 1 year or sooner if needed.   Marquon Alcala Ladoris Gene, MHS [facility] 06/13/2017 9:37 AM

## 2017-06-13 NOTE — Progress Notes (Signed)
Surgical clearance letter

## 2017-06-13 NOTE — Patient Instructions (Signed)
Medication Instructions:  Your physician recommends that you continue on your current medications as directed. Please refer to the Current Medication list given to you today.  Labwork: Today for liver function test, complete blood count and fasting lipids  Testing/Procedures: None ordered   Follow-Up: Your physician wants you to follow-up in: 1 year with Dr. Angelena Form. You will receive a reminder letter in the mail two months in advance. If you don't receive a letter, please call our office to schedule the follow-up appointment.  Any Other Special Instructions Will Be Listed Below (If Applicable). You have been cleared for your procedure. Refer to surgical clearance letter.   If you need a refill on your cardiac medications before your next appointment, please call your pharmacy.

## 2017-06-14 ENCOUNTER — Telehealth: Payer: Self-pay | Admitting: Cardiology

## 2017-06-14 LAB — HEPATIC FUNCTION PANEL
ALK PHOS: 98 IU/L (ref 39–117)
ALT: 25 IU/L (ref 0–32)
AST: 23 IU/L (ref 0–40)
Albumin: 4.5 g/dL (ref 3.6–4.8)
BILIRUBIN, DIRECT: 0.19 mg/dL (ref 0.00–0.40)
Bilirubin Total: 0.8 mg/dL (ref 0.0–1.2)
TOTAL PROTEIN: 7.2 g/dL (ref 6.0–8.5)

## 2017-06-14 LAB — CBC
HEMATOCRIT: 37.4 % (ref 34.0–46.6)
HEMOGLOBIN: 12.4 g/dL (ref 11.1–15.9)
MCH: 32 pg (ref 26.6–33.0)
MCHC: 33.2 g/dL (ref 31.5–35.7)
MCV: 97 fL (ref 79–97)
Platelets: 288 10*3/uL (ref 150–379)
RBC: 3.87 x10E6/uL (ref 3.77–5.28)
RDW: 13.1 % (ref 12.3–15.4)
WBC: 7.8 10*3/uL (ref 3.4–10.8)

## 2017-06-14 LAB — LIPID PANEL
CHOLESTEROL TOTAL: 154 mg/dL (ref 100–199)
Chol/HDL Ratio: 2.6 ratio (ref 0.0–4.4)
HDL: 60 mg/dL (ref >39)
LDL CALC: 69 mg/dL (ref 0–99)
TRIGLYCERIDES: 125 mg/dL (ref 0–149)
VLDL Cholesterol Cal: 25 mg/dL (ref 5–40)

## 2017-06-14 NOTE — Telephone Encounter (Signed)
New Message   Patient is calling and requesting that her lab results from yesterday be mailed to her. Thank you.

## 2017-06-14 NOTE — Telephone Encounter (Signed)
Labs mailed to pt

## 2017-07-12 ENCOUNTER — Other Ambulatory Visit: Payer: Self-pay | Admitting: Cardiovascular Disease

## 2017-07-19 ENCOUNTER — Telehealth: Payer: Self-pay | Admitting: Cardiovascular Disease

## 2017-07-19 NOTE — Telephone Encounter (Signed)
I told pt it was OK to take occasional tylenol as needed. I told her to follow label instructions.  She is having hip and shoulder pain.

## 2017-07-19 NOTE — Telephone Encounter (Signed)
New message     Patient calling to get suggestions on what medication to take for daily aches since she is on Plavix.  Please call

## 2017-08-05 ENCOUNTER — Telehealth: Payer: Self-pay

## 2017-08-05 NOTE — Telephone Encounter (Signed)
   [facility] Pre-operative Risk Assessment    Request for surgical clearance:  1. What type of surgery is being performed? Soft tissue graft   2. When is this surgery scheduled? tbd   3. What type of clearance is required (medical clearance vs. Pharmacy clearance to hold med vs. Both)? med  4. Are there any medications that need to be held prior to surgery and how long?plavix   5. Practice name and name of physician performing surgery? [facility] Dr. Elenora Fender   6. What is your office phone and fax number? [phone]  [phone]   7. Anesthesia type (None, local, MAC, general) ? local   Frederik Schmidt 08/05/2017, 1:34 PM  _________________________________________________________________   (provider comments below)

## 2017-08-08 ENCOUNTER — Telehealth: Payer: Self-pay | Admitting: Cardiovascular Disease

## 2017-08-08 NOTE — Telephone Encounter (Signed)
OK to stop Plavix for procedure.   Angel Rivera

## 2017-08-08 NOTE — Telephone Encounter (Signed)
Left message to call back.  Clearance request is in pre op clearance pool.

## 2017-08-08 NOTE — Telephone Encounter (Signed)
Dr Julianne Handler based on our pre op protocol I will ask Dr Julianne Handler if it's OK to hold Plavix before I contact her for pre op clearance.    Kerin Ransom PA-C 08/08/2017 1:39 PM

## 2017-08-08 NOTE — Telephone Encounter (Addendum)
Left message to call back- if stable (no agnina) OK for surgery and OK to hold Plavix pre op as needed.  Kerin Ransom PA-C 08/08/2017 3:35 PM   Second message left  Rosaria Ferries, PA-C 08/09/2017 2:42 PM Beeper [phone]

## 2017-08-08 NOTE — Telephone Encounter (Signed)
New Message:     Angel Rivera from [facility] is calling to check on the clearance sent in on 4/1

## 2017-08-12 NOTE — Telephone Encounter (Addendum)
Returned call to patient to assess if she is currently having any symptoms.  She is currently asymptomatic and will be okay to hold Plavix per Dr. Camillia Herter note.  She will hold and restart based on dentist recommendations.  She was greatful for call back.    All faxed to [facility]

## 2017-08-12 NOTE — Telephone Encounter (Signed)
New message ° °Pt verbalized that she is returning call for RN °

## 2017-08-12 NOTE — Telephone Encounter (Signed)
Pt returned call this morning.  I spoke with her and asked her to call back this afternoon between 1:30 and 5:00.  Pt aware to ask for pre op clearance pool.

## 2017-08-12 NOTE — Telephone Encounter (Signed)
I spoke with [name] and told her clearance was received and would be returned after our office spoke with Angel Rivera. I spoke with Angel Rivera and asked her to call back between 1:30 and 5:00 today to speak with preop clearance pool.

## 2017-08-19 DIAGNOSIS — J0100 Acute maxillary sinusitis, unspecified: Secondary | ICD-10-CM | POA: Diagnosis not present

## 2017-08-19 DIAGNOSIS — I2510 Atherosclerotic heart disease of native coronary artery without angina pectoris: Secondary | ICD-10-CM | POA: Diagnosis not present

## 2017-08-19 DIAGNOSIS — J3089 Other allergic rhinitis: Secondary | ICD-10-CM | POA: Diagnosis not present

## 2017-08-19 DIAGNOSIS — Z87891 Personal history of nicotine dependence: Secondary | ICD-10-CM | POA: Diagnosis not present

## 2017-08-28 ENCOUNTER — Emergency Department (HOSPITAL_COMMUNITY)
Admission: EM | Admit: 2017-08-28 | Discharge: 2017-08-28 | Disposition: A | Discharge status: Home / Self Care | Payer: PPO | Attending: Emergency Medicine | Admitting: Emergency Medicine

## 2017-08-28 ENCOUNTER — Encounter (HOSPITAL_COMMUNITY): Payer: Self-pay

## 2017-08-28 ENCOUNTER — Emergency Department (HOSPITAL_COMMUNITY): Payer: PPO

## 2017-08-28 ENCOUNTER — Other Ambulatory Visit: Payer: Self-pay

## 2017-08-28 DIAGNOSIS — I2510 Atherosclerotic heart disease of native coronary artery without angina pectoris: Secondary | ICD-10-CM | POA: Diagnosis not present

## 2017-08-28 DIAGNOSIS — Z7902 Long term (current) use of antithrombotics/antiplatelets: Secondary | ICD-10-CM | POA: Insufficient documentation

## 2017-08-28 DIAGNOSIS — R2241 Localized swelling, mass and lump, right lower limb: Secondary | ICD-10-CM | POA: Insufficient documentation

## 2017-08-28 DIAGNOSIS — Z79899 Other long term (current) drug therapy: Secondary | ICD-10-CM | POA: Insufficient documentation

## 2017-08-28 DIAGNOSIS — M79671 Pain in right foot: Secondary | ICD-10-CM | POA: Insufficient documentation

## 2017-08-28 DIAGNOSIS — Z955 Presence of coronary angioplasty implant and graft: Secondary | ICD-10-CM | POA: Insufficient documentation

## 2017-08-28 DIAGNOSIS — Z7982 Long term (current) use of aspirin: Secondary | ICD-10-CM | POA: Insufficient documentation

## 2017-08-28 DIAGNOSIS — R52 Pain, unspecified: Secondary | ICD-10-CM

## 2017-08-28 DIAGNOSIS — Z87891 Personal history of nicotine dependence: Secondary | ICD-10-CM | POA: Insufficient documentation

## 2017-08-28 DIAGNOSIS — M7989 Other specified soft tissue disorders: Secondary | ICD-10-CM | POA: Diagnosis not present

## 2017-08-28 LAB — CBC WITH DIFFERENTIAL/PLATELET
Basophils Absolute: 0.1 10*3/uL (ref 0.0–0.1)
Basophils Relative: 1 %
EOS PCT: 1 %
Eosinophils Absolute: 0.2 10*3/uL (ref 0.0–0.7)
HEMATOCRIT: 41.5 % (ref 36.0–46.0)
HEMOGLOBIN: 13.5 g/dL (ref 12.0–15.0)
Lymphocytes Relative: 17 %
Lymphs Abs: 2.9 10*3/uL (ref 0.7–4.0)
MCH: 31.7 pg (ref 26.0–34.0)
MCHC: 32.5 g/dL (ref 30.0–36.0)
MCV: 97.4 fL (ref 78.0–100.0)
Monocytes Absolute: 0.8 10*3/uL (ref 0.1–1.0)
Monocytes Relative: 5 %
NEUTROS ABS: 13.2 10*3/uL — AB (ref 1.7–7.7)
Neutrophils Relative %: 76 %
PLATELETS: 363 10*3/uL (ref 150–400)
RBC: 4.26 MIL/uL (ref 3.87–5.11)
RDW: 12.8 % (ref 11.5–15.5)
WBC: 17.2 10*3/uL — AB (ref 4.0–10.5)

## 2017-08-28 LAB — COMPREHENSIVE METABOLIC PANEL
ALK PHOS: 97 U/L (ref 38–126)
ALT: 22 U/L (ref 14–54)
AST: 21 U/L (ref 15–41)
Albumin: 4.3 g/dL (ref 3.5–5.0)
Anion gap: 12 (ref 5–15)
BILIRUBIN TOTAL: 1.3 mg/dL — AB (ref 0.3–1.2)
BUN: 17 mg/dL (ref 6–20)
CALCIUM: 9.5 mg/dL (ref 8.9–10.3)
CO2: 24 mmol/L (ref 22–32)
CREATININE: 1.12 mg/dL — AB (ref 0.44–1.00)
Chloride: 101 mmol/L (ref 101–111)
GFR calc non Af Amer: 50 mL/min — ABNORMAL LOW (ref >60)
GFR, EST AFRICAN AMERICAN: 58 mL/min — AB (ref >60)
Glucose, Bld: 92 mg/dL (ref 65–99)
Potassium: 3.8 mmol/L (ref 3.5–5.1)
Sodium: 137 mmol/L (ref 135–145)
TOTAL PROTEIN: 8.5 g/dL — AB (ref 6.5–8.1)

## 2017-08-28 LAB — URIC ACID: URIC ACID, SERUM: 8.1 mg/dL — AB (ref 2.3–6.6)

## 2017-08-28 MED ORDER — HYDROCODONE-ACETAMINOPHEN 5-325 MG PO TABS: 1.0000 | ORAL_TABLET | Freq: Four times a day (QID) | ORAL | 0 refills | Status: DC | PRN

## 2017-08-28 MED ORDER — SULFAMETHOXAZOLE-TRIMETHOPRIM 800-160 MG PO TABS: ORAL_TABLET | ORAL | 0 refills | Status: DC

## 2017-08-28 MED ORDER — COLCHICINE 0.6 MG PO TABS: ORAL_TABLET | ORAL | 0 refills | Status: DC

## 2017-08-28 NOTE — ED Provider Notes (Signed)
[name] EMERGENCY DEPARTMENT Provider Note   CSN: [id number] Arrival date & time: 08/28/17  2956     History   Chief Complaint Chief Complaint  Patient presents with  . Foot Pain    HPI Angel Rivera is a 67 y.o. female.  Patient complains of pain and swelling to right lateral foot  The history is provided by the patient.  Foot Pain  This is a new problem. The current episode started 2 days ago. The problem occurs constantly. The problem has not changed since onset.Pertinent negatives include no chest pain, no abdominal pain and no headaches. Exacerbated by: Movement. Nothing relieves the symptoms. She has tried nothing for the symptoms.    Past Medical History:  Diagnosis Date  . Coronary artery disease    NATIVE VESSEL (ICD-414.01)-cath 08/17/09 with subtotally occluded RCA, moderate disease LAD.  Marland Kitchen Hyperlipidemia   . Hypotension   . Myocardial infarction ([place]) 08/17/2009  . Tobacco user     Patient Active Problem List   Diagnosis Date Noted  . Diarrhea 05/16/2017  . Encounter for HCV screening test for high risk patient 05/16/2017  . Chest pain 06/17/2016  . Unstable angina ([place]) 12/30/2015  . HYPERCHOLESTEROLEMIA 09/09/2009  . MYOCARDIAL INFARCTION, 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  . Coronary artery disease involving native coronary artery of native heart with unstable angina pectoris ([place]) 09/09/2009  . HYPOTENSION 09/09/2009    Past Surgical History:  Procedure Laterality Date  . ABDOMINAL HYSTERECTOMY  1980s   laparoscopic  . CARDIAC CATHETERIZATION N/A 12/30/2015   Procedure: Left Heart Cath and Coronary Angiography;  Surgeon: Burnell Blanks, MD;  Location: [place] [facility];  Service: Cardiovascular;  Laterality: N/A;  . CARDIAC CATHETERIZATION N/A 12/30/2015   Procedure: Coronary Stent Intervention;  Surgeon: Burnell Blanks, MD;  Location: [place] [facility];  Service: Cardiovascular;  Laterality: N/A;  . CORONARY ANGIOPLASTY WITH STENT  PLACEMENT  08/17/2009   "1 stent"  . TUBAL LIGATION  ~ 1980     OB History   None      Home Medications    Prior to Admission medications   Medication Sig Start Date End Date Taking? Authorizing Provider  aspirin EC 81 MG tablet Take 1 tablet (81 mg total) by mouth daily. 04/08/17  Yes Burnell Blanks, MD  clopidogrel (PLAVIX) 75 MG tablet Take 1 tablet (75 mg total) by mouth daily. 04/08/17  Yes Burnell Blanks, MD  furosemide (LASIX) 40 MG tablet Take 40 mg by mouth daily as needed for edema. 03/11/17  Yes [provider]  lovastatin (MEVACOR) 40 MG tablet TAKE 1 TABLET BY MOUTH AT BEDTIME 07/12/17  Yes Burnell Blanks, MD  metoprolol succinate (TOPROL-XL) 25 MG 24 hr tablet TAKE 1/2 TABLET BY MOUTH DAILY 07/12/17  Yes Burnell Blanks, MD  nitroGLYCERIN (NITROSTAT) 0.4 MG SL tablet Place 1 tablet (0.4 mg total) under the tongue every 5 (five) minutes as needed for chest pain. 3 DOSES MAX 06/13/17  Yes Lyda Jester M, PA-C  colchicine 0.6 MG tablet Take one pill a day 08/28/17   Milton Ferguson, MD  HYDROcodone-acetaminophen (NORCO/VICODIN) 5-325 MG tablet Take 1 tablet by mouth every 6 (six) hours as needed for moderate pain. 08/28/17   Milton Ferguson, MD  sulfamethoxazole-trimethoprim (BACTRIM DS,SEPTRA DS) 800-160 MG tablet Take one pill bid 08/28/17   Milton Ferguson, MD    Family History Family History  Problem Relation Age of Onset  . Heart attack Mother   . Coronary  artery disease Unknown   . Heart disease Father        bypass  . Heart disease Brother        bypass    Social History Social History   Tobacco Use  . Smoking status: Former Smoker    Packs/day: 0.50    Years: 33.00    Pack years: 16.50    Types: Cigarettes    Last attempt to quit: 05/07/2013    Years since quitting: 4.3  . Smokeless tobacco: Never Used  Substance Use Topics  . Alcohol use: Yes    Comment: 12/30/2015 "might have a drink once/year"  . Drug use: No      Allergies   Atorvastatin   Review of Systems Review of Systems  Constitutional: Negative for appetite change and fatigue.  HENT: Negative for congestion, ear discharge and sinus pressure.   Eyes: Negative for discharge.  Respiratory: Negative for cough.   Cardiovascular: Negative for chest pain.  Gastrointestinal: Negative for abdominal pain and diarrhea.  Genitourinary: Negative for frequency and hematuria.  Musculoskeletal: Negative for back pain.       Pain in right foot  Skin: Negative for rash.  Neurological: Negative for seizures and headaches.  Psychiatric/Behavioral: Negative for hallucinations.     Physical Exam Updated Vital Signs BP (!) 164/84 (BP Location: Right Arm)   Pulse 88   Temp 98.2 F (36.8 C) (Oral)   Resp 12   Ht 5\' 3"  (1.6 m)   Wt 66.7 kg (147 lb)   SpO2 99%   BMI 26.04 kg/m   Physical Exam  Constitutional: She is oriented to person, place, and time. She appears well-developed.  HENT:  Head: Normocephalic.  Eyes: Conjunctivae are normal.  Neck: No tracheal deviation present.  Cardiovascular:  No murmur heard. Musculoskeletal: Normal range of motion.  Mild tenderness swelling lateral right foot.  Neurological: She is oriented to person, place, and time.  Skin: Skin is warm.  Psychiatric: She has a normal mood and affect.     ED Treatments / Results  Labs (all labs ordered are listed, but only abnormal results are displayed) Labs Reviewed  CBC WITH DIFFERENTIAL/PLATELET - Abnormal; Notable for the following components:      Result Value   WBC 17.2 (*)    Neutro Abs 13.2 (*)    All other components within normal limits  COMPREHENSIVE METABOLIC PANEL - Abnormal; Notable for the following components:   Creatinine, Ser 1.12 (*)    Total Protein 8.5 (*)    Total Bilirubin 1.3 (*)    GFR calc non Af Amer 50 (*)    GFR calc Af Amer 58 (*)    All other components within normal limits  URIC ACID - Abnormal; Notable for the  following components:   Uric Acid, Serum 8.1 (*)    All other components within normal limits    EKG None  Radiology Dg Foot Complete Right  Result Date: 08/28/2017 CLINICAL DATA:  Pain and swelling for 3 days EXAM: RIGHT FOOT COMPLETE - 3+ VIEW COMPARISON:  None. FINDINGS: Frontal, oblique, and lateral views obtained. There is no fracture or dislocation. There is mild spurring in the dorsal midfoot. Other joint spaces appear unremarkable. No erosive change. There are spurs arising from the posterior and inferior calcaneus. IMPRESSION: Mild spurring in dorsal midfoot region. There are calcaneal spurs. No fracture or dislocation. No erosive change. Electronically Signed   By: Lowella Grip III M.D.   On: 08/28/2017 11:37  Procedures Procedures (including critical care time)  Medications Ordered in ED Medications - No data to display   Initial Impression / Assessment and Plan / ED Course  I have reviewed the triage vital signs and the nursing notes.  Pertinent labs & imaging results that were available during my care of the patient were reviewed by me and considered in my medical decision making (see chart for details).     X-ray unremarkable.  Patient with pain and swelling to lateral right foot.  She does have a uric acid is elevated also white count is elevated patient will be placed on antibiotics and colchicine and given pain medicine and referred back to her primary care doctor  Final Clinical Impressions(s) / ED Diagnoses   Final diagnoses:  Foot pain, right    ED Discharge Orders        Ordered    HYDROcodone-acetaminophen (NORCO/VICODIN) 5-325 MG tablet  Every 6 hours PRN     08/28/17 1314    sulfamethoxazole-trimethoprim (BACTRIM DS,SEPTRA DS) 800-160 MG tablet     08/28/17 1314    colchicine 0.6 MG tablet     08/28/17 1314       Milton Ferguson, MD 08/28/17 1318

## 2017-08-28 NOTE — ED Triage Notes (Signed)
Pt c/o pain to outer part of r foot since Sunday.  Denies injury.  Pt says foot swollen and tender.  Pedal pulse present.  Pt able to wiggle toes.  Foot warm and dry.

## 2017-08-28 NOTE — Discharge Instructions (Addendum)
Follow up with your md next week. °

## 2017-09-06 DIAGNOSIS — M25432 Effusion, left wrist: Secondary | ICD-10-CM | POA: Diagnosis not present

## 2017-09-06 DIAGNOSIS — M79632 Pain in left forearm: Secondary | ICD-10-CM | POA: Diagnosis not present

## 2017-09-06 DIAGNOSIS — M25532 Pain in left wrist: Secondary | ICD-10-CM | POA: Diagnosis not present

## 2017-09-06 DIAGNOSIS — S6992XA Unspecified injury of left wrist, hand and finger(s), initial encounter: Secondary | ICD-10-CM | POA: Diagnosis not present

## 2017-09-10 DIAGNOSIS — I2510 Atherosclerotic heart disease of native coronary artery without angina pectoris: Secondary | ICD-10-CM | POA: Diagnosis not present

## 2017-09-10 DIAGNOSIS — M79632 Pain in left forearm: Secondary | ICD-10-CM | POA: Diagnosis not present

## 2017-09-10 DIAGNOSIS — S52502A Unspecified fracture of the lower end of left radius, initial encounter for closed fracture: Secondary | ICD-10-CM | POA: Diagnosis not present

## 2017-09-10 DIAGNOSIS — Z7901 Long term (current) use of anticoagulants: Secondary | ICD-10-CM | POA: Diagnosis not present

## 2017-09-11 ENCOUNTER — Ambulatory Visit: Payer: PPO | Admitting: Orthopedic Surgery

## 2017-09-11 ENCOUNTER — Encounter: Payer: Self-pay | Admitting: Orthopedic Surgery

## 2017-09-11 VITALS — BP 120/67 | HR 75 | Ht 63.5 in | Wt 145.0 lb

## 2017-09-11 DIAGNOSIS — S52532A Colles' fracture of left radius, initial encounter for closed fracture: Secondary | ICD-10-CM | POA: Diagnosis not present

## 2017-09-11 NOTE — Progress Notes (Signed)
Patient ID: Angel Rivera, female   DOB: 07/10/1950, 67 y.o.   MRN: [id number]  Chief Complaint  Patient presents with  . Wrist Injury    09/06/17 fell over curb washing car, has left wrist pain fracture     HPI MYELLE Rivera is a 67 y.o. female.   Presents for eval of left wrist fracture  C/o mild left wrist pain x 5 days assoc with pain when she moves the wrist    Review of Systems Review of Systems  All other systems reviewed and are negative.    Past Medical History:  Diagnosis Date  . Coronary artery disease    NATIVE VESSEL (ICD-414.01)-cath 08/17/09 with subtotally occluded RCA, moderate disease LAD.  Marland Kitchen Hyperlipidemia   . Hypotension   . Myocardial infarction ([place]) 08/17/2009  . Tobacco user     Past Surgical History:  Procedure Laterality Date  . ABDOMINAL HYSTERECTOMY  1980s   laparoscopic  . CARDIAC CATHETERIZATION N/A 12/30/2015   Procedure: Left Heart Cath and Coronary Angiography;  Surgeon: Burnell Blanks, MD;  Location: [place] [facility];  Service: Cardiovascular;  Laterality: N/A;  . CARDIAC CATHETERIZATION N/A 12/30/2015   Procedure: Coronary Stent Intervention;  Surgeon: Burnell Blanks, MD;  Location: [place] [facility];  Service: Cardiovascular;  Laterality: N/A;  . CORONARY ANGIOPLASTY WITH STENT PLACEMENT  08/17/2009   "1 stent"  . TUBAL LIGATION  ~ 1980      Physical Exam 1 Blood pressure 120/67, pulse 75, height 5' 3.5" (1.613 m), weight 145 lb (65.8 kg). Physical Exam 2 The patient is well developed well nourished and well groomed. 3 Orientation to person place and time is normal  4 Mood is pleasant.  5 Ambulatory status normal   6 Inspection of the left wrist reveals mild tenderness   min swelling no deformity 7 Range of motion assessment: The range of motion is diminished primarily secondary to pain 8 Stability tests are deferred because of pain but the x-ray shows no subluxation of the joint 9 Strength assessment muscle tone  is normal resistance testing is deferred because of pain and swelling  10 Nerve function normal  11 Vascular function normal  12 Local lymphatic system negative   Opposite extremity right arm  there is no alignment abnormality, no contracture, no subluxation, no atrophy and neurovascular exam is intact   MEDICAL DECISION SECTION  xrays ordered? no  My independent reading of xrays: I see a non dispalced fracture left wrist    Encounter Diagnosis  Name Primary?  . Closed Colles' fracture of left radius, initial encounter Yes     PLAN:  Sac applied and xrays in 4 weeks oop   Injection? no MRI/CT/? no

## 2017-09-16 ENCOUNTER — Ambulatory Visit (INDEPENDENT_AMBULATORY_CARE_PROVIDER_SITE_OTHER): Payer: PPO | Admitting: Orthopedic Surgery

## 2017-09-16 DIAGNOSIS — S52532D Colles' fracture of left radius, subsequent encounter for closed fracture with routine healing: Secondary | ICD-10-CM

## 2017-09-16 NOTE — Progress Notes (Signed)
Fracture care follow-up  Chief complaint my cast is loose  Encounter Diagnosis  Name Primary?  . Closed Colles' fracture of left radius with routine healing, subsequent encounter Yes     Current Outpatient Medications:  .  aspirin EC 81 MG tablet, Take 1 tablet (81 mg total) by mouth daily., Disp: 90 tablet, Rfl: 3 .  clopidogrel (PLAVIX) 75 MG tablet, Take 1 tablet (75 mg total) by mouth daily., Disp: 90 tablet, Rfl: 3 .  colchicine 0.6 MG tablet, Take one pill a day, Disp: 6 tablet, Rfl: 0 .  furosemide (LASIX) 40 MG tablet, Take 40 mg by mouth daily as needed for edema., Disp: , Rfl:  .  HYDROcodone-acetaminophen (NORCO/VICODIN) 5-325 MG tablet, Take 1 tablet by mouth every 6 (six) hours as needed for moderate pain., Disp: 20 tablet, Rfl: 0 .  LORazepam (ATIVAN) 0.5 MG tablet, , Disp: , Rfl:  .  lovastatin (MEVACOR) 40 MG tablet, TAKE 1 TABLET BY MOUTH AT BEDTIME, Disp: 90 tablet, Rfl: 3 .  metoprolol succinate (TOPROL-XL) 25 MG 24 hr tablet, TAKE 1/2 TABLET BY MOUTH DAILY, Disp: 45 tablet, Rfl: 3 .  nitroGLYCERIN (NITROSTAT) 0.4 MG SL tablet, Place 1 tablet (0.4 mg total) under the tongue every 5 (five) minutes as needed for chest pain. 3 DOSES MAX, Disp: 25 tablet, Rfl: 3     There were no vitals taken for this visit.  Physical Exam  Patient presented with loose cast cast removed skin normal patient placed in a new short arm cast keep appointment as previously noted with x-ray next time out of plaster

## 2017-09-23 ENCOUNTER — Ambulatory Visit (INDEPENDENT_AMBULATORY_CARE_PROVIDER_SITE_OTHER): Payer: PPO | Admitting: Orthopedic Surgery

## 2017-09-23 ENCOUNTER — Encounter: Payer: Self-pay | Admitting: Orthopedic Surgery

## 2017-09-23 VITALS — BP 126/70 | HR 69 | Ht 63.5 in | Wt 145.0 lb

## 2017-09-23 DIAGNOSIS — S52532D Colles' fracture of left radius, subsequent encounter for closed fracture with routine healing: Secondary | ICD-10-CM

## 2017-09-23 DIAGNOSIS — S52532A Colles' fracture of left radius, initial encounter for closed fracture: Secondary | ICD-10-CM | POA: Insufficient documentation

## 2017-09-23 NOTE — Progress Notes (Signed)
Progress Note   Patient ID: Angel Rivera, female   DOB: Apr 18, 1951, 67 y.o.   MRN: [id number]  Chief Complaint  Patient presents with  . Cast Problem    patient states cast feels loose      Medical decision-making Encounter Diagnosis  Name Primary?  . Closed Colles' fracture of left radius with routine healing, subsequent encounter Yes     PLAN: Cast change follow-up as previously noted for x-ray out of plaster    No orders of the defined types were placed in this encounter.        Chief Complaint  Patient presents with  . Cast Problem    patient states cast feels loose     Distal radius fracture treated with cast complains of pain over the first extensor compartment as well as the ulnar styloid    ROS Current Meds  Medication Sig  . aspirin EC 81 MG tablet Take 1 tablet (81 mg total) by mouth daily.  . clopidogrel (PLAVIX) 75 MG tablet Take 1 tablet (75 mg total) by mouth daily.  . colchicine 0.6 MG tablet Take one pill a day  . furosemide (LASIX) 40 MG tablet Take 40 mg by mouth daily as needed for edema.  Marland Kitchen HYDROcodone-acetaminophen (NORCO/VICODIN) 5-325 MG tablet Take 1 tablet by mouth every 6 (six) hours as needed for moderate pain.  Marland Kitchen LORazepam (ATIVAN) 0.5 MG tablet   . lovastatin (MEVACOR) 40 MG tablet TAKE 1 TABLET BY MOUTH AT BEDTIME  . metoprolol succinate (TOPROL-XL) 25 MG 24 hr tablet TAKE 1/2 TABLET BY MOUTH DAILY  . nitroGLYCERIN (NITROSTAT) 0.4 MG SL tablet Place 1 tablet (0.4 mg total) under the tongue every 5 (five) minutes as needed for chest pain. 3 DOSES MAX    Allergies  Allergen Reactions  . Atorvastatin Other (See Comments)    Leg pain     BP 126/70   Pulse 69   Ht 5' 3.5" (1.613 m)   Wt 145 lb (65.8 kg)   BMI 25.28 kg/m   Physical Exam  Musculoskeletal:  The IP joint of the thumb is working fine she has tenderness primarily in most likely from the cast over the first extensor compartment and then she has ulnar styloid pain  which may indicate radial ulnar joint injury from the original injury       Arther Abbott, MD 09/23/2017 12:08 PM

## 2017-09-24 ENCOUNTER — Ambulatory Visit: Payer: PPO | Admitting: Orthopedic Surgery

## 2017-10-09 ENCOUNTER — Telehealth: Payer: Self-pay | Admitting: Orthopedic Surgery

## 2017-10-09 ENCOUNTER — Encounter: Payer: Self-pay | Admitting: Orthopedic Surgery

## 2017-10-09 ENCOUNTER — Ambulatory Visit (INDEPENDENT_AMBULATORY_CARE_PROVIDER_SITE_OTHER): Payer: PPO

## 2017-10-09 ENCOUNTER — Ambulatory Visit (INDEPENDENT_AMBULATORY_CARE_PROVIDER_SITE_OTHER): Payer: Self-pay | Admitting: Orthopedic Surgery

## 2017-10-09 VITALS — Ht 63.5 in | Wt 145.0 lb

## 2017-10-09 DIAGNOSIS — S52532D Colles' fracture of left radius, subsequent encounter for closed fracture with routine healing: Secondary | ICD-10-CM

## 2017-10-09 DIAGNOSIS — E2839 Other primary ovarian failure: Secondary | ICD-10-CM

## 2017-10-09 DIAGNOSIS — S52532A Colles' fracture of left radius, initial encounter for closed fracture: Secondary | ICD-10-CM | POA: Diagnosis not present

## 2017-10-09 NOTE — Telephone Encounter (Signed)
Called gave instruction and ordered the DEXA per Dr Aline Brochure.

## 2017-10-09 NOTE — Telephone Encounter (Signed)
Patient called following her office visit today; said "was so excited and happy" that she forgot to ask how long during the day to wear the splint?  Please advise.  Best # for this afternoon (home) [phone]

## 2017-10-09 NOTE — Progress Notes (Signed)
Fracture care follow-up  Encounter Diagnosis  Name Primary?  . Closed Colles' fracture of left radius with routine healing, subsequent encounter 09/06/17 Yes   Chief Complaint  Patient presents with  . Follow-up    Recheck on left wrist fracture, DOI 09-06-17.    She is doing well.  She still has some pain over the ulna.  Her x-ray looks great she has slight ulnar positive variance otherwise anatomic alignment  Clinical exam shows some tenderness over the ulna none at the radius or wrist joint some at the ulnocarpal joint  Recommend bone density test  Splint  3-week follow-up clinical exam only

## 2017-10-21 ENCOUNTER — Telehealth: Payer: Self-pay | Admitting: Radiology

## 2017-10-21 NOTE — Telephone Encounter (Signed)
DEXA sch for Wed 26th 1pm , at [facility], left message for patient, asked her to call me back let me know she has gotten the message.

## 2017-10-28 DIAGNOSIS — L570 Actinic keratosis: Secondary | ICD-10-CM | POA: Diagnosis not present

## 2017-10-28 DIAGNOSIS — D225 Melanocytic nevi of trunk: Secondary | ICD-10-CM | POA: Diagnosis not present

## 2017-10-28 DIAGNOSIS — X32XXXD Exposure to sunlight, subsequent encounter: Secondary | ICD-10-CM | POA: Diagnosis not present

## 2017-10-30 ENCOUNTER — Ambulatory Visit (INDEPENDENT_AMBULATORY_CARE_PROVIDER_SITE_OTHER): Payer: PPO | Admitting: Orthopedic Surgery

## 2017-10-30 ENCOUNTER — Ambulatory Visit (HOSPITAL_COMMUNITY)
Admission: RE | Admit: 2017-10-30 | Discharge: 2017-10-30 | Disposition: A | Discharge status: Home / Self Care | Payer: PPO | Source: Ambulatory Visit | Attending: Orthopedic Surgery | Admitting: Orthopedic Surgery

## 2017-10-30 ENCOUNTER — Encounter: Payer: Self-pay | Admitting: Orthopedic Surgery

## 2017-10-30 VITALS — BP 131/79 | HR 77 | Ht 63.5 in | Wt 147.0 lb

## 2017-10-30 DIAGNOSIS — S52532D Colles' fracture of left radius, subsequent encounter for closed fracture with routine healing: Secondary | ICD-10-CM

## 2017-10-30 DIAGNOSIS — M85852 Other specified disorders of bone density and structure, left thigh: Secondary | ICD-10-CM | POA: Diagnosis not present

## 2017-10-30 DIAGNOSIS — X58XXXD Exposure to other specified factors, subsequent encounter: Secondary | ICD-10-CM | POA: Insufficient documentation

## 2017-10-30 DIAGNOSIS — E2839 Other primary ovarian failure: Secondary | ICD-10-CM | POA: Diagnosis not present

## 2017-10-30 DIAGNOSIS — Z780 Asymptomatic menopausal state: Secondary | ICD-10-CM | POA: Diagnosis not present

## 2017-10-30 NOTE — Progress Notes (Signed)
Fracture care follow-up.  Status post left Colles' fracture treated with cast  Patient complains of some dorsal pain over the left wrist  Examination shows decreased wrist flexion mild tenderness over the distal radius and ulna.  Mild swelling is noted in that area  Neurovascular exam is intact extensor pollicis longus tendon function is normal  Patient is scheduled for a bone density test today we will follow-up by phone  Follow-up in 4 weeks continue active range of motion normal use limit heavy lifting  Encounter Diagnosis  Name Primary?  . Closed Colles' fracture of left radius with routine healing, subsequent encounter 09/06/17 Yes

## 2017-11-27 ENCOUNTER — Encounter: Payer: Self-pay | Admitting: Orthopedic Surgery

## 2017-11-27 ENCOUNTER — Ambulatory Visit (INDEPENDENT_AMBULATORY_CARE_PROVIDER_SITE_OTHER): Payer: PPO | Admitting: Orthopedic Surgery

## 2017-11-27 VITALS — BP 113/73 | HR 70 | Ht 63.5 in | Wt 147.0 lb

## 2017-11-27 DIAGNOSIS — S6982XS Other specified injuries of left wrist, hand and finger(s), sequela: Secondary | ICD-10-CM

## 2017-11-27 DIAGNOSIS — S52532D Colles' fracture of left radius, subsequent encounter for closed fracture with routine healing: Secondary | ICD-10-CM

## 2017-11-27 NOTE — Progress Notes (Signed)
Left distal radius fracture Sep 06, 2017 treated with cast  Patient complains of ulnar-sided wrist pain  Exam shows tenderness and swelling over the TFCC with a positive sulcus sign  Recommend bracing for 6 weeks.  We also offered injection MRI with surgery  If she does not improve after 6 weeks we should include injection as a treatment option prior to MRI arthrography  BP 113/73   Pulse 70   Ht 5' 3.5" (1.613 m)   Wt 147 lb (66.7 kg)   BMI 25.63 kg/m    ASSESSMENT: The BMD measured at Femur Neck Left is 0.738 g/cm2 with a T-score of -2.2. This patient is considered osteopenic according to [facility]) criteria.  World [facility]) criteria for post-menopausal, Caucasian Women: Normal:       T-score at or above -1 SD Osteopenia:   T-score between -1 and -2.5 SD Osteoporosis: T-score at or below -2.5 SD  Encounter Diagnoses  Name Primary?  . Closed Colles' fracture of left radius with routine healing, subsequent encounter 09/06/17   . TFCC (triangular fibrocartilage complex) injury, left, sequela Yes

## 2018-01-08 ENCOUNTER — Ambulatory Visit (INDEPENDENT_AMBULATORY_CARE_PROVIDER_SITE_OTHER): Payer: PPO | Admitting: Orthopedic Surgery

## 2018-01-08 ENCOUNTER — Encounter: Payer: Self-pay | Admitting: Orthopedic Surgery

## 2018-01-08 VITALS — BP 129/65 | HR 69 | Ht 63.5 in | Wt 153.0 lb

## 2018-01-08 DIAGNOSIS — S6982XS Other specified injuries of left wrist, hand and finger(s), sequela: Secondary | ICD-10-CM | POA: Diagnosis not present

## 2018-01-08 DIAGNOSIS — S52532D Colles' fracture of left radius, subsequent encounter for closed fracture with routine healing: Secondary | ICD-10-CM

## 2018-01-08 NOTE — Progress Notes (Signed)
Chief Complaint  Patient presents with  . Wrist Pain    left s/p fracture 09/06/17    Fracture follow-up from Sep 06, 2017 distal radius fracture treated with cast post cast removal patient complained of ulnar-sided wrist pain which she actually had some at the time of injury  Repeat films did not show any ulnar-sided abnormality fracture healed well with good alignment  Patient was splinted could not tolerate splinting still complains of ulnar-sided wrist pain  Review of systems she denies numbness or tingling of the wrist just complains of decreased range of motion  BP 129/65   Pulse 69   Ht 5' 3.5" (1.613 m)   Wt 153 lb (69.4 kg)   BMI 26.68 kg/m   She is awake alert and oriented x3 mood and affect are normal General appearance well-developed well-nourished neurovascular exam is intact good pulse and perfusion of the left wrist grip strength has returned to normal  She has tenderness over the ulnar side of the wrist positive sulcus sign pain with flexion otherwise normal range of motion  She declines injection and declines MRI with contrast.  She will contact us if she cannot tolerate the symptoms  Encounter Diagnoses  Name Primary?  . TFCC (triangular fibrocartilage complex) injury, left, sequela Yes  . Closed Colles' fracture of left radius with routine healing, subsequent encounter 09/06/17

## 2018-02-07 ENCOUNTER — Other Ambulatory Visit (HOSPITAL_COMMUNITY): Payer: Self-pay | Admitting: Family Medicine

## 2018-02-07 DIAGNOSIS — Z1231 Encounter for screening mammogram for malignant neoplasm of breast: Secondary | ICD-10-CM

## 2018-02-18 DIAGNOSIS — E785 Hyperlipidemia, unspecified: Secondary | ICD-10-CM | POA: Diagnosis not present

## 2018-02-18 DIAGNOSIS — Z7901 Long term (current) use of anticoagulants: Secondary | ICD-10-CM | POA: Diagnosis not present

## 2018-02-18 DIAGNOSIS — G4700 Insomnia, unspecified: Secondary | ICD-10-CM | POA: Diagnosis not present

## 2018-02-18 DIAGNOSIS — I2510 Atherosclerotic heart disease of native coronary artery without angina pectoris: Secondary | ICD-10-CM | POA: Diagnosis not present

## 2018-03-09 IMAGING — DX DG CHEST 2V
2 series · 2 of 2 positions shown · non-contrast
Comparison: 08/17/2009

CLINICAL DATA: Cough and chest pain for 3 days non smoker surg-0
stent HTN

EXAM:
CHEST  2 VIEW

[chest pa]
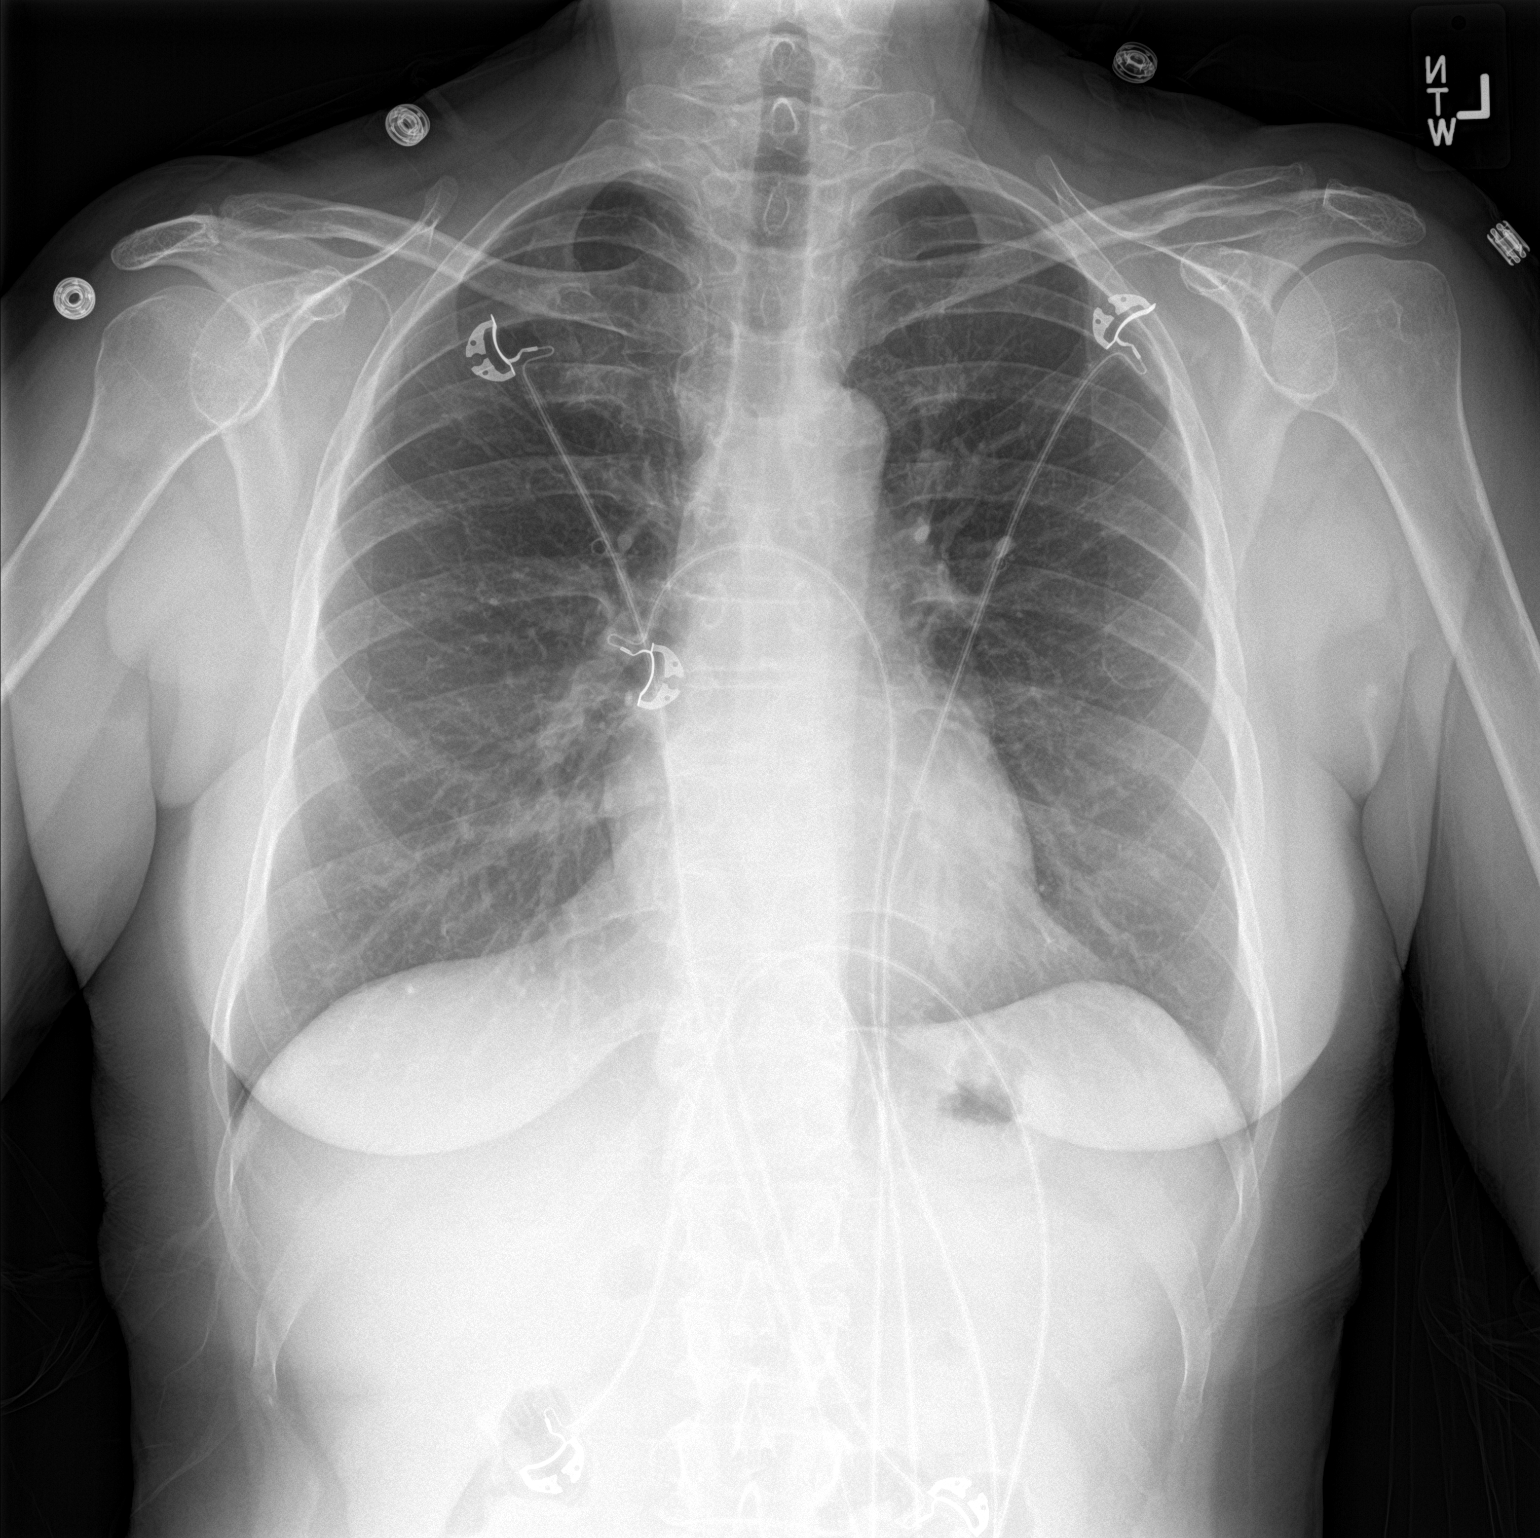

[chest lat]
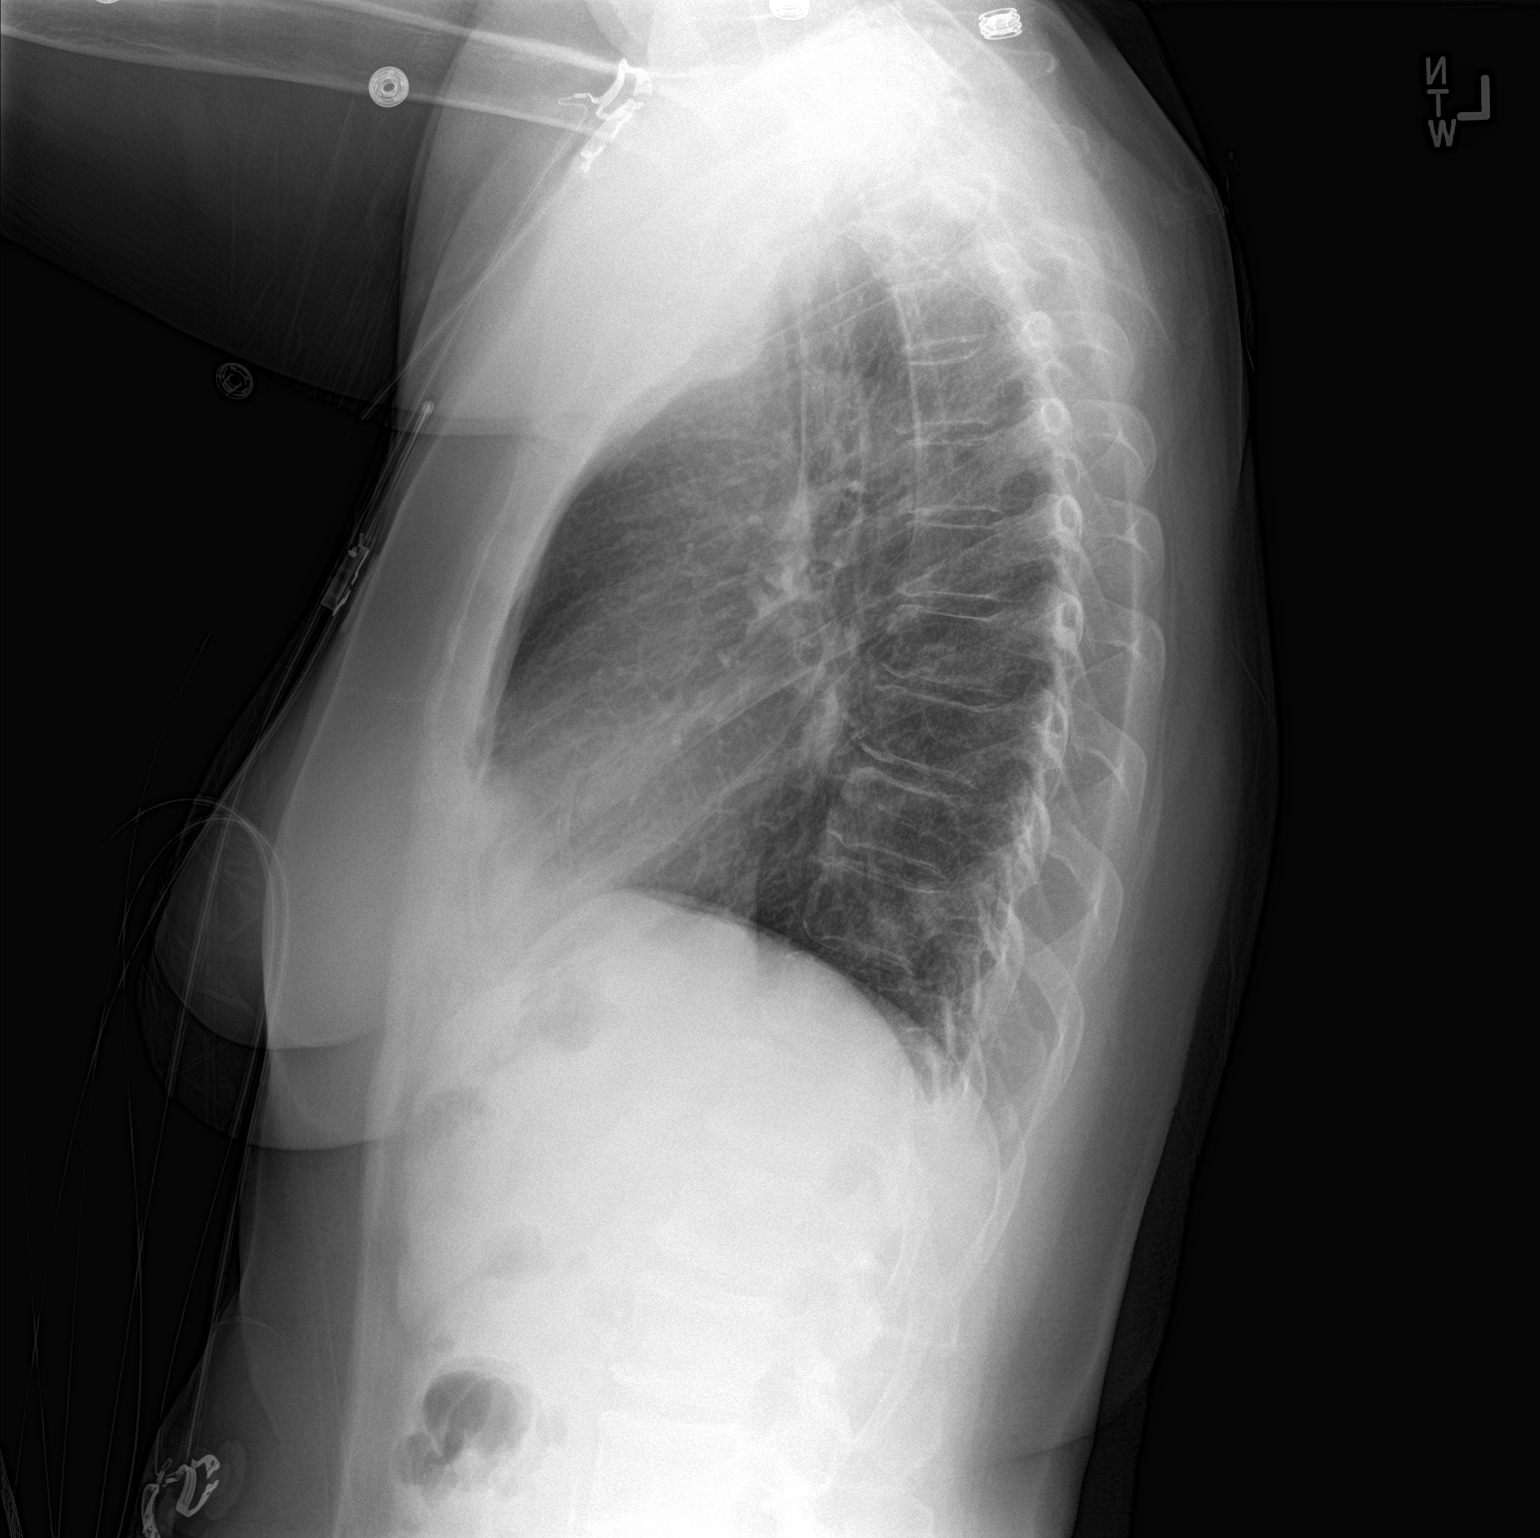

[2 of 2 positions shown; findings below may reference images not displayed]

FINDINGS: Normal mediastinum and cardiac silhouette. Normal pulmonary
vasculature. No evidence of effusion, infiltrate, or pneumothorax.
No acute bony abnormality.
IMPRESSION: No acute cardiopulmonary process.

## 2018-03-12 DIAGNOSIS — Z7901 Long term (current) use of anticoagulants: Secondary | ICD-10-CM | POA: Diagnosis not present

## 2018-03-12 DIAGNOSIS — I2510 Atherosclerotic heart disease of native coronary artery without angina pectoris: Secondary | ICD-10-CM | POA: Diagnosis not present

## 2018-03-12 DIAGNOSIS — G4700 Insomnia, unspecified: Secondary | ICD-10-CM | POA: Diagnosis not present

## 2018-03-12 DIAGNOSIS — E785 Hyperlipidemia, unspecified: Secondary | ICD-10-CM | POA: Diagnosis not present

## 2018-03-13 ENCOUNTER — Encounter (HOSPITAL_COMMUNITY): Payer: Self-pay

## 2018-03-13 ENCOUNTER — Ambulatory Visit (HOSPITAL_COMMUNITY)
Admission: RE | Admit: 2018-03-13 | Discharge: 2018-03-13 | Disposition: A | Discharge status: Home / Self Care | Payer: PPO | Source: Ambulatory Visit | Attending: Family Medicine | Admitting: Family Medicine

## 2018-03-13 DIAGNOSIS — Z1231 Encounter for screening mammogram for malignant neoplasm of breast: Secondary | ICD-10-CM | POA: Diagnosis not present

## 2018-03-21 ENCOUNTER — Ambulatory Visit: Payer: PPO | Admitting: Cardiovascular Disease

## 2018-03-21 ENCOUNTER — Encounter: Payer: Self-pay | Admitting: Cardiovascular Disease

## 2018-03-21 VITALS — BP 126/80 | HR 72 | Ht 63.5 in | Wt 151.2 lb

## 2018-03-21 DIAGNOSIS — F17201 Nicotine dependence, unspecified, in remission: Secondary | ICD-10-CM | POA: Diagnosis not present

## 2018-03-21 DIAGNOSIS — I2510 Atherosclerotic heart disease of native coronary artery without angina pectoris: Secondary | ICD-10-CM | POA: Diagnosis not present

## 2018-03-21 DIAGNOSIS — E785 Hyperlipidemia, unspecified: Secondary | ICD-10-CM

## 2018-03-21 NOTE — Progress Notes (Signed)
Chief Complaint  Patient presents with  . Follow-up    CAD     History of Present Illness: 67 yo female with history of CAD, hyperlipidemia and tobacco abuse here today for cardiac follow up. She was admitted to [facility] 2011 with a NSTEMI secondary to subtotally occluded distal RCA which was treated with a drug eluting stent. She was also found to have moderate disease in the mid LAD (50%), mid Circumflex (40%), proximal RCA (40%). She stopped smoking February 2015. I saw her in August 2017 and she c/o pain in her upper back which was her prior anginal equivalent. Cardiac cath 12/30/15 with severe stenosis mid RCA treated with drug eluting stent. There was stable moderate disease in the mid LAD and mid Circumflex. She has not tolerated Lipitor but has tolerated Mevacor. She also did not tolerate Coreg but has tolerated low dose Toprol.   She is here today for follow up. The patient denies any chest pain, dyspnea, palpitations, lower extremity edema, orthopnea, PND, dizziness, near syncope or syncope.   Primary Care Physician: Lemmie Evens, MD   Past Medical History:  Diagnosis Date  . Coronary artery disease    NATIVE VESSEL (ICD-414.01)-cath 08/17/09 with subtotally occluded RCA, moderate disease LAD.  Marland Kitchen Hyperlipidemia   . Hypotension   . Myocardial infarction ([place]) 08/17/2009  . Tobacco user     Past Surgical History:  Procedure Laterality Date  . ABDOMINAL HYSTERECTOMY  1980s   laparoscopic  . CARDIAC CATHETERIZATION N/A 12/30/2015   Procedure: Left Heart Cath and Coronary Angiography;  Surgeon: Burnell Blanks, MD;  Location: [place] [facility];  Service: Cardiovascular;  Laterality: N/A;  . CARDIAC CATHETERIZATION N/A 12/30/2015   Procedure: Coronary Stent Intervention;  Surgeon: Burnell Blanks, MD;  Location: [place] [facility];  Service: Cardiovascular;  Laterality: N/A;  . CORONARY ANGIOPLASTY WITH STENT PLACEMENT  08/17/2009   "1 stent"  . TUBAL LIGATION   ~ 1980    Current Outpatient Medications  Medication Sig Dispense Refill  . aspirin EC 81 MG tablet Take 1 tablet (81 mg total) by mouth daily. 90 tablet 3  . furosemide (LASIX) 40 MG tablet Take 40 mg by mouth daily as needed for edema.    Marland Kitchen LORazepam (ATIVAN) 0.5 MG tablet     . lovastatin (MEVACOR) 40 MG tablet TAKE 1 TABLET BY MOUTH AT BEDTIME 90 tablet 3  . metoprolol succinate (TOPROL-XL) 25 MG 24 hr tablet TAKE 1/2 TABLET BY MOUTH DAILY 45 tablet 3  . nitroGLYCERIN (NITROSTAT) 0.4 MG SL tablet Place 1 tablet (0.4 mg total) under the tongue every 5 (five) minutes as needed for chest pain. 3 DOSES MAX 25 tablet 3   No current facility-administered medications for this visit.     Allergies  Allergen Reactions  . Atorvastatin Other (See Comments)    Leg pain    Social History   Socioeconomic History  . Marital status: Divorced    Spouse name: Not on file  . Number of children: Not on file  . Years of education: Not on file  . Highest education level: Not on file  Occupational History  . Not on file  Social Needs  . Financial resource strain: Not on file  . Food insecurity:    Worry: Not on file    Inability: Not on file  . Transportation needs:    Medical: Not on file    Non-medical: Not on file  Tobacco Use  . Smoking status:  Former Smoker    Packs/day: 0.50    Years: 33.00    Pack years: 16.50    Types: Cigarettes    Last attempt to quit: 05/07/2013    Years since quitting: 4.8  . Smokeless tobacco: Never Used  Substance and Sexual Activity  . Alcohol use: Yes    Comment: 12/30/2015 "might have a drink once/year"  . Drug use: No  . Sexual activity: Not on file  Lifestyle  . Physical activity:    Days per week: Not on file    Minutes per session: Not on file  . Stress: Not on file  Relationships  . Social connections:    Talks on phone: Not on file    Gets together: Not on file    Attends religious service: Not on file    Active member of club or  organization: Not on file    Attends meetings of clubs or organizations: Not on file    Relationship status: Not on file  . Intimate partner violence:    Fear of current or ex partner: Not on file    Emotionally abused: Not on file    Physically abused: Not on file    Forced sexual activity: Not on file  Other Topics Concern  . Not on file  Social History Narrative   The patient has a almost 1-pack per day tobacco use     history.     She occasionally has alcohol-no abuse.     No illicit drug use   Divorced, 2 children    Family History  Problem Relation Age of Onset  . Heart attack Mother   . Coronary artery disease Unknown   . Heart disease Father        bypass  . Heart disease Brother        bypass    Review of Systems:  As stated in the HPI and otherwise negative.   BP 126/80   Pulse 72   Ht 5' 3.5" (1.613 m)   Wt 151 lb 3.2 oz (68.6 kg)   SpO2 99%   BMI 26.36 kg/m   Physical Examination:  General: Well developed, well nourished, NAD  HEENT: OP clear, mucus membranes moist  SKIN: warm, dry. No rashes. Neuro: No focal deficits  Musculoskeletal: Muscle strength 5/5 all ext  Psychiatric: Mood and affect normal  Neck: No JVD, no carotid bruits, no thyromegaly, no lymphadenopathy.  Lungs:Clear bilaterally, no wheezes, rhonci, crackles Cardiovascular: Regular rate and rhythm. No murmurs, gallops or rubs. Abdomen:Soft. Bowel sounds present. Non-tender.  Extremities: No lower extremity edema. Pulses are 2 + in the bilateral DP/PT.  EKG:  EKG is not ordered today. The ekg ordered today demonstrates NSR, rate 72 bpm.   Recent Labs: 08/28/2017: ALT 22; BUN 17; Creatinine, Ser 1.12; Hemoglobin 13.5; Platelets 363; Potassium 3.8; Sodium 137   Lipid Panel    Component Value Date/Time   CHOL 154 06/13/2017 0955   TRIG 125 06/13/2017 0955   HDL 60 06/13/2017 0955   CHOLHDL 2.6 06/13/2017 0955   CHOLHDL 2.3 04/03/2016 0919   VLDL 24 04/03/2016 0919   LDLCALC 69  06/13/2017 0955   LDLDIRECT 113.7 10/15/2011 1105     Wt Readings from Last 3 Encounters:  03/21/18 151 lb 3.2 oz (68.6 kg)  01/08/18 153 lb (69.4 kg)  11/27/17 147 lb (66.7 kg)     Other studies Reviewed: Additional studies/ records that were reviewed today include: . Review of the above records demonstrates:  Assessment and Plan:   1. CAD without angina: No chest pain or back pain. Her anginal equivalent is left scapular pain. Continue ASA, statin and beta blocker.  I will stop Plavix.   2. Tobacco abuse, in remission: She stopped smoking in February 2015.   3. Hyperlipidemia: LDL at goal in February 2019. Labs checked in primary care this week. She is unsure if her cholesterol was checked. She will call back with results. If it was not checked, we can check her lipids and LFTs in February 2020. Continue statin.   Current medicines are reviewed at length with the patient today.  The patient does not have concerns regarding medicines.  The following changes have been made:  no change  Labs/ tests ordered today include:   Orders Placed This Encounter  Procedures  . EKG 12-Lead    Disposition:   FU with me 12 months.   Signed, Lauree Chandler, MD 03/21/2018 2:54 PM    [facility] [place], [place], [place]  [address] Phone: [phone]; Fax: [phone]

## 2018-03-21 NOTE — Patient Instructions (Addendum)
Medication Instructions:   Your physician has recommended you make the following change in your medication: Stop Plavix    If you need a refill on your cardiac medications before your next appointment, please call your pharmacy.   Lab work: none If you have labs (blood work) drawn today and your tests are completely normal, you will receive your results only by: Marland Kitchen MyChart Message (if you have MyChart) OR . A paper copy in the mail If you have any lab test that is abnormal or we need to change your treatment, we will call you to review the results.  Testing/Procedures: none  Follow-Up: At [facility], you and your health needs are our priority.  As part of our continuing mission to provide you with exceptional heart care, we have created designated Provider Care Teams.  These Care Teams include your primary Cardiologist (physician) and Advanced Practice Providers (APPs -  Physician Assistants and Nurse Practitioners) who all work together to provide you with the care you need, when you need it. You will need a follow up appointment in 12 months.  Please call our office 2 months in advance to schedule this appointment.  You may see Lauree Chandler, MD or one of the following Advanced Practice Providers on your designated Care Team:   [place], PA-C Melina Copa, PA-C . Ermalinda Barrios, PA-C  Any Other Special Instructions Will Be Listed Below (If Applicable).

## 2018-04-07 ENCOUNTER — Telehealth: Payer: Self-pay | Admitting: Cardiovascular Disease

## 2018-04-07 NOTE — Telephone Encounter (Signed)
PT HAS HER PCP FAX HER LAB WORK OVER AND SHE HASN'T HEARD BACK FROM [place] ABOUT IT. SHE IS CHECKING TO SEE IF WE GOT IT AND IF WE NEED TO MAKE ANY MED CHANGES

## 2018-04-07 NOTE — Telephone Encounter (Signed)
Labs from 03/12/18 have been scanned in.

## 2018-04-07 NOTE — Telephone Encounter (Signed)
LDL at goal. LFTs ok. CBC ok. Renal function stable. Can you let her know that I reviewed everything and it all looks good? Thanks, Gerald Stabs

## 2018-04-07 NOTE — Telephone Encounter (Signed)
Per DPR it is OK to leave message on voicemail [phone]).  Message left. Also left message to call office if any questions.

## 2018-05-01 ENCOUNTER — Other Ambulatory Visit: Payer: Self-pay | Admitting: Cardiovascular Disease

## 2018-05-12 DIAGNOSIS — J0110 Acute frontal sinusitis, unspecified: Secondary | ICD-10-CM | POA: Diagnosis not present

## 2018-07-09 ENCOUNTER — Other Ambulatory Visit (HOSPITAL_COMMUNITY)
Admission: RE | Admit: 2018-07-09 | Discharge: 2018-07-09 | Disposition: A | Discharge status: Home / Self Care | Payer: PPO | Source: Ambulatory Visit | Attending: Nurse Practitioner | Admitting: Nurse Practitioner

## 2018-07-09 ENCOUNTER — Other Ambulatory Visit: Payer: Self-pay

## 2018-07-09 DIAGNOSIS — L0390 Cellulitis, unspecified: Secondary | ICD-10-CM | POA: Insufficient documentation

## 2018-07-09 DIAGNOSIS — M79671 Pain in right foot: Secondary | ICD-10-CM | POA: Diagnosis not present

## 2018-07-09 LAB — CBC WITH DIFFERENTIAL/PLATELET
Abs Immature Granulocytes: 0.02 10*3/uL (ref 0.00–0.07)
BASOS PCT: 1 %
Basophils Absolute: 0.1 10*3/uL (ref 0.0–0.1)
Eosinophils Absolute: 0.3 10*3/uL (ref 0.0–0.5)
Eosinophils Relative: 3 %
HCT: 41.1 % (ref 36.0–46.0)
Hemoglobin: 13.1 g/dL (ref 12.0–15.0)
Immature Granulocytes: 0 %
Lymphocytes Relative: 32 %
Lymphs Abs: 3.3 10*3/uL (ref 0.7–4.0)
MCH: 32 pg (ref 26.0–34.0)
MCHC: 31.9 g/dL (ref 30.0–36.0)
MCV: 100.2 fL — AB (ref 80.0–100.0)
MONOS PCT: 6 %
Monocytes Absolute: 0.6 10*3/uL (ref 0.1–1.0)
Neutro Abs: 5.9 10*3/uL (ref 1.7–7.7)
Neutrophils Relative %: 58 %
PLATELETS: 290 10*3/uL (ref 150–400)
RBC: 4.1 MIL/uL (ref 3.87–5.11)
RDW: 12.2 % (ref 11.5–15.5)
WBC: 10.3 10*3/uL (ref 4.0–10.5)
nRBC: 0 % (ref 0.0–0.2)

## 2018-07-09 LAB — URIC ACID: URIC ACID, SERUM: 8 mg/dL — AB (ref 2.5–7.1)

## 2018-07-09 LAB — SEDIMENTATION RATE: SED RATE: 20 mm/h (ref 0–22)

## 2018-07-17 ENCOUNTER — Other Ambulatory Visit: Payer: Self-pay

## 2018-07-17 MED ORDER — METOPROLOL SUCCINATE ER 25 MG PO TB24: 12.5000 mg | ORAL_TABLET | Freq: Every day | ORAL | 2 refills | Status: DC

## 2018-08-15 ENCOUNTER — Other Ambulatory Visit: Payer: Self-pay

## 2018-08-15 ENCOUNTER — Ambulatory Visit (HOSPITAL_COMMUNITY)
Admission: RE | Admit: 2018-08-15 | Discharge: 2018-08-15 | Disposition: A | Discharge status: Home / Self Care | Payer: PPO | Source: Ambulatory Visit | Attending: Family Medicine | Admitting: Family Medicine

## 2018-08-15 ENCOUNTER — Other Ambulatory Visit (HOSPITAL_COMMUNITY): Payer: Self-pay | Admitting: Family Medicine

## 2018-08-15 DIAGNOSIS — R52 Pain, unspecified: Secondary | ICD-10-CM | POA: Diagnosis not present

## 2018-08-15 DIAGNOSIS — T1490XA Injury, unspecified, initial encounter: Secondary | ICD-10-CM

## 2018-08-15 DIAGNOSIS — M546 Pain in thoracic spine: Secondary | ICD-10-CM | POA: Diagnosis not present

## 2018-08-15 DIAGNOSIS — H18593 Other hereditary corneal dystrophies, bilateral: Secondary | ICD-10-CM | POA: Diagnosis not present

## 2018-08-15 DIAGNOSIS — M545 Low back pain: Secondary | ICD-10-CM | POA: Diagnosis not present

## 2018-08-15 DIAGNOSIS — H2513 Age-related nuclear cataract, bilateral: Secondary | ICD-10-CM | POA: Diagnosis not present

## 2018-08-15 DIAGNOSIS — H5213 Myopia, bilateral: Secondary | ICD-10-CM | POA: Diagnosis not present

## 2018-08-15 DIAGNOSIS — H40053 Ocular hypertension, bilateral: Secondary | ICD-10-CM | POA: Diagnosis not present

## 2018-11-03 ENCOUNTER — Ambulatory Visit: Payer: PPO | Admitting: Orthopedic Surgery

## 2018-11-03 ENCOUNTER — Other Ambulatory Visit: Payer: Self-pay

## 2018-11-03 ENCOUNTER — Telehealth: Payer: Self-pay | Admitting: Orthopedic Surgery

## 2018-11-03 ENCOUNTER — Encounter: Payer: Self-pay | Admitting: Orthopedic Surgery

## 2018-11-03 ENCOUNTER — Ambulatory Visit (INDEPENDENT_AMBULATORY_CARE_PROVIDER_SITE_OTHER): Payer: PPO

## 2018-11-03 VITALS — BP 139/74 | HR 80 | Temp 98.4°F | Ht 63.5 in | Wt 143.0 lb

## 2018-11-03 DIAGNOSIS — M722 Plantar fascial fibromatosis: Secondary | ICD-10-CM | POA: Diagnosis not present

## 2018-11-03 DIAGNOSIS — M79671 Pain in right foot: Secondary | ICD-10-CM

## 2018-11-03 NOTE — Telephone Encounter (Signed)
At [place] on 11/03/18, patient states Dr Aline Brochure said she's to call if not better in 8 weeks - did not wish to schedule at this time; aware to call if needs to be seen.

## 2018-11-03 NOTE — Patient Instructions (Signed)
Phase 1 treatment Continue ibuprofen Cryotherapy Stretching exercises Tuli heel cups  Phase 2 treatment Injection

## 2018-11-03 NOTE — Progress Notes (Signed)
Angel Rivera  11/03/2018  HISTORY SECTION :  Chief Complaint  Patient presents with  . Foot Pain    Rt heel for a couple months NKI   The patient presents for evaluation of right heel pain for 2 months no trauma pain is located over the plantar aspect of the heel it is a sharp pain when she gets out of a chair or restarts but kind of has a dull aching pain there all the time mild to moderate in severity not associated with any trauma and unrelieved by ibuprofen and Epson salt   Review of Systems  HENT: Positive for tinnitus.   All other systems reviewed and are negative.    Past Medical History:  Diagnosis Date  . Coronary artery disease    NATIVE VESSEL (ICD-414.01)-cath 08/17/09 with subtotally occluded RCA, moderate disease LAD.  Marland Kitchen Hyperlipidemia   . Hypotension   . Myocardial infarction ([place]) 08/17/2009  . Tobacco user     Past Surgical History:  Procedure Laterality Date  . ABDOMINAL HYSTERECTOMY  1980s   laparoscopic  . CARDIAC CATHETERIZATION N/A 12/30/2015   Procedure: Left Heart Cath and Coronary Angiography;  Surgeon: Burnell Blanks, MD;  Location: [place] [facility];  Service: Cardiovascular;  Laterality: N/A;  . CARDIAC CATHETERIZATION N/A 12/30/2015   Procedure: Coronary Stent Intervention;  Surgeon: Burnell Blanks, MD;  Location: [place] [facility];  Service: Cardiovascular;  Laterality: N/A;  . CORONARY ANGIOPLASTY WITH STENT PLACEMENT  08/17/2009   "1 stent"  . TUBAL LIGATION  ~ 1980     Allergies  Allergen Reactions  . Atorvastatin Other (See Comments)    Leg pain     Current Outpatient Medications:  .  aspirin EC 81 MG tablet, Take 1 tablet (81 mg total) by mouth daily., Disp: 90 tablet, Rfl: 3 .  furosemide (LASIX) 40 MG tablet, Take 40 mg by mouth daily as needed for edema., Disp: , Rfl:  .  LORazepam (ATIVAN) 0.5 MG tablet, , Disp: , Rfl:  .  lovastatin (MEVACOR) 40 MG tablet, TAKE 1 TABLET BY MOUTH AT BEDTIME, Disp: 90 tablet,  Rfl: 3 .  nitroGLYCERIN (NITROSTAT) 0.4 MG SL tablet, Place 1 tablet (0.4 mg total) under the tongue every 5 (five) minutes as needed for chest pain. 3 DOSES MAX, Disp: 25 tablet, Rfl: 3 .  metoprolol succinate (TOPROL-XL) 25 MG 24 hr tablet, Take 0.5 tablets (12.5 mg total) by mouth daily. (Patient not taking: Reported on 11/03/2018), Disp: 45 tablet, Rfl: 2   PHYSICAL EXAM SECTION: 1) BP 139/74   Pulse 80   Temp 98.4 F (36.9 C)   Ht 5' 3.5" (1.613 m)   Wt 148 lb (67.1 kg)   BMI 25.81 kg/m   Body mass index is 25.81 kg/m. General appearance: Well-developed well-nourished no gross deformities  2) Cardiovascular normal pulse and perfusion in all 4 extremities normal color without edema  3) Neurologically deep tendon reflexes are equal and normal, no sensation loss or deficits no pathologic reflexes  4) Psychological: Awake alert and oriented x3 mood and affect normal  5) Skin no lacerations or ulcerations no nodularity no palpable masses, no erythema or nodularity  6) Musculoskeletal:   Left foot alignment normal full range of motion at the ankle ankle is stable no muscle atrophy skin looks clean  Right foot tenderness over the plantar aspect of the heel some pulling with dorsiflexion of the foot in the Achilles but no restriction of motion.  She has  a normal left not high arch with a stable ankle skin is clean muscle tone is normal   MEDICAL DECISION SECTION:  Encounter Diagnosis  Name Primary?  . Pain of right heel Yes    Imaging Ortho care [place]  Plan:  (Rx., Inj., surg., Frx, MRI/CT, XR:2) Phase 1 treatment Continue ibuprofen Cryotherapy Stretching exercises Tuli heel cups  Phase 2 treatment Injection  Procedure note Inject plantar fascia    Timeout was completed to confirm the site of injection right heel  The medications used were 40 mg of Depo-Medrol and 1% lidocaine 3 cc  Anesthesia was provided by ethyl chloride and the skin was prepped with  alcohol.  After cleaning the skin with alcohol a 25-gauge needle was used to inject the plantar fascia, no complications were noted sterile bandage was applied  2:20 PM 11/03/2018 Arther Abbott, MD

## 2019-02-13 ENCOUNTER — Other Ambulatory Visit (HOSPITAL_COMMUNITY): Payer: Self-pay | Admitting: Family Medicine

## 2019-02-13 DIAGNOSIS — Z1231 Encounter for screening mammogram for malignant neoplasm of breast: Secondary | ICD-10-CM

## 2019-03-18 NOTE — Progress Notes (Signed)
Cardiology Office Note    Date:  03/24/2019   ID:  Angel, Rivera 1950/09/13, MRN [id number]  PCP:  Lemmie Evens, MD  Cardiologist: Lauree Chandler, MD EPS: None  Chief Complaint  Patient presents with  . Follow-up    History of Present Illness:  Angel Rivera is a 68 y.o. female with history of CAD status post NSTEMI 2011 treated with DES to distal RCA with residual 50% mid LAD, 40% mid circumflex, 40% proximal RCA.  Repeat cath 2017 status post DES to mid RCA.  Also has hyperlipidemia intolerant to Lipitor but tolerated Mevacor, has not tolerated Coreg in the past but can tolerate Toprol.  Last office visit with Dr. Angelena Rivera 03/21/2018 and was doing well.  Occasionally has a flutter in her chest. No chest tightness, shortness of breath, dizziness or presyncope. Not exercising but works in her yard. Having some sharp pain in her groin into her lower leg. [place] down her steps in April.  Will follow up with PCP/orthopedist for this.  Just moved homes.  Grandson who is disabled having surgery at [facility] in Dec for hip dysplasia.   Past Medical History:  Diagnosis Date  . Coronary artery disease    NATIVE VESSEL (ICD-414.01)-cath 08/17/09 with subtotally occluded RCA, moderate disease LAD.  Marland Kitchen Hyperlipidemia   . Hypotension   . Myocardial infarction ([place]) 08/17/2009  . Tobacco user     Past Surgical History:  Procedure Laterality Date  . ABDOMINAL HYSTERECTOMY  1980s   laparoscopic  . CARDIAC CATHETERIZATION N/A 12/30/2015   Procedure: Left Heart Cath and Coronary Angiography;  Surgeon: Burnell Blanks, MD;  Location: [place] [facility];  Service: Cardiovascular;  Laterality: N/A;  . CARDIAC CATHETERIZATION N/A 12/30/2015   Procedure: Coronary Stent Intervention;  Surgeon: Burnell Blanks, MD;  Location: [place] [facility];  Service: Cardiovascular;  Laterality: N/A;  . CORONARY ANGIOPLASTY WITH STENT PLACEMENT  08/17/2009   "1 stent"  . TUBAL LIGATION  ~  1980    Current Medications: Current Meds  Medication Sig  . aspirin EC 81 MG tablet Take 1 tablet (81 mg total) by mouth daily.  . furosemide (LASIX) 40 MG tablet Take 1 tablet (40 mg total) by mouth daily as needed for edema.  Marland Kitchen LORazepam (ATIVAN) 0.5 MG tablet   . lovastatin (MEVACOR) 40 MG tablet Take 1 tablet (40 mg total) by mouth at bedtime.  . metoprolol succinate (TOPROL-XL) 25 MG 24 hr tablet Take 0.5 tablets (12.5 mg total) by mouth daily.  . nitroGLYCERIN (NITROSTAT) 0.4 MG SL tablet Place 1 tablet (0.4 mg total) under the tongue every 5 (five) minutes as needed for chest pain. 3 DOSES MAX  . [DISCONTINUED] aspirin EC 81 MG tablet Take 1 tablet (81 mg total) by mouth daily.  . [DISCONTINUED] furosemide (LASIX) 40 MG tablet Take 40 mg by mouth daily as needed for edema.  . [DISCONTINUED] lovastatin (MEVACOR) 40 MG tablet TAKE 1 TABLET BY MOUTH AT BEDTIME  . [DISCONTINUED] metoprolol succinate (TOPROL-XL) 25 MG 24 hr tablet Take 0.5 tablets (12.5 mg total) by mouth daily.  . [DISCONTINUED] nitroGLYCERIN (NITROSTAT) 0.4 MG SL tablet Place 1 tablet (0.4 mg total) under the tongue every 5 (five) minutes as needed for chest pain. 3 DOSES MAX     Allergies:   Atorvastatin   Social History   Socioeconomic History  . Marital status: Divorced    Spouse name: Not on file  . Number of children: Not on file  .  Years of education: Not on file  . Highest education level: Not on file  Occupational History  . Not on file  Social Needs  . Financial resource strain: Not on file  . Food insecurity    Worry: Not on file    Inability: Not on file  . Transportation needs    Medical: Not on file    Non-medical: Not on file  Tobacco Use  . Smoking status: Former Smoker    Packs/day: 0.50    Years: 33.00    Pack years: 16.50    Types: Cigarettes    Quit date: 05/07/2013    Years since quitting: 5.8  . Smokeless tobacco: Never Used  Substance and Sexual Activity  . Alcohol use: Yes     Comment: 12/30/2015 "might have a drink once/year"  . Drug use: No  . Sexual activity: Not on file  Lifestyle  . Physical activity    Days per week: Not on file    Minutes per session: Not on file  . Stress: Not on file  Relationships  . Social Herbalist on phone: Not on file    Gets together: Not on file    Attends religious service: Not on file    Active member of club or organization: Not on file    Attends meetings of clubs or organizations: Not on file    Relationship status: Not on file  Other Topics Concern  . Not on file  Social History Narrative   The patient has a almost 1-pack per day tobacco use     history.     She occasionally has alcohol-no abuse.     No illicit drug use   Divorced, 2 children     Family History:  The patient's   family history includes Coronary artery disease in her unknown relative; Heart attack in her mother; Heart disease in her brother and father.   ROS:   Please see the history of present illness.    ROS All other systems reviewed and are negative.   PHYSICAL EXAM:   VS:  BP 126/64   Pulse 75   Ht 5' 3.5" (1.613 m)   Wt 141 lb 12.8 oz (64.3 kg)   SpO2 97%   BMI 24.72 kg/m   Physical Exam  GEN: Well nourished, well developed, in no acute distress  Neck: no JVD, carotid bruits, or masses Cardiac:RRR; no murmurs, rubs, or gallops  Respiratory:  clear to auscultation bilaterally, normal work of breathing GI: soft, nontender, nondistended, + BS Ext: without cyanosis, clubbing, or edema, Good distal pulses bilaterally Neuro:  Alert and Oriented x 3 Psych: euthymic mood, full affect  Wt Readings from Last 3 Encounters:  03/24/19 141 lb 12.8 oz (64.3 kg)  11/03/18 143 lb (64.9 kg)  03/21/18 151 lb 3.2 oz (68.6 kg)      Studies/Labs Reviewed:   EKG:  EKG is ordered today.  The ekg ordered today demonstrates normal sinus rhythm with possible ectopic atrial rhythm otherwise no change  Recent Labs: 07/09/2018:  Hemoglobin 13.1; Platelets 290   Lipid Panel    Component Value Date/Time   CHOL 154 06/13/2017 0955   TRIG 125 06/13/2017 0955   HDL 60 06/13/2017 0955   CHOLHDL 2.6 06/13/2017 0955   CHOLHDL 2.3 04/03/2016 0919   VLDL 24 04/03/2016 0919   LDLCALC 69 06/13/2017 0955   LDLDIRECT 113.7 10/15/2011 1105    Additional studies/ records that were reviewed today include:  Cardiac cath 2017Mid RCA to Dist RCA lesion, 5 %stenosed.  A STENT RESOLUTE INTEG 2.5X22 drug eluting stent was successfully placed.  Mid RCA lesion, 80 %stenosed.  Post intervention, there is a 0% residual stenosis.  Mid Cx lesion, 50 %stenosed.  Mid LAD lesion, 50 %stenosed.  Dist LAD lesion, 30 %stenosed.  The left ventricular systolic function is normal.  LV end diastolic pressure is normal.  The left ventricular ejection fraction is 50-55% by visual estimate.  There is no mitral valve regurgitation.   1. Triple vessel CAD 2. Moderate stenosis in the mid LAD which is unchanged from cath in 2011. This does not appear to be flow limiting 3. Moderate stenosis mid Circumflex. This is unchanged in appearance from cath in 2011. This does not appear to be flow limiting.  4. Severe stenosis mid RCA just before the old stent. This stenosis is hazy and appears ulcerated.  5. Normal LV systolic function 6. Successful PTCA/DES x 1 mid RCA   Recommendations: She will be started on Brilinta after loading dose in cath lab. We will use ASA and Brilinta for DAPT. She will be enrolled in the TWILIGHT study (ASA/Brilinta for 3 months then will randomize to Brilinta alone or ASA/Brilinta). Continue beta blocker and statin.        ASSESSMENT:    1. Coronary artery disease involving native coronary artery of native heart without angina pectoris   2. Hyperlipidemia, unspecified hyperlipidemia type      PLAN:  In order of problems listed above:  CAD status post NSTEMI 2011 treated with DES to distal RCA with  residual 50% mid LAD, 40% mid circumflex, 40% proximal RCA.  Repeat cath 2017 status post DES to mid RCA.  Doing well without angina.  Continue aspirin lovastatin and Toprol.  Check surveillance labs today.  Hyperlipidemia 72 triglycerides 192 03/2018 check fasting lipid panel today.  Patient did have half a cup of coffee with a little bit of sugar.     Medication Adjustments/Labs and Tests Ordered: Current medicines are reviewed at length with the patient today.  Concerns regarding medicines are outlined above.  Medication changes, Labs and Tests ordered today are listed in the Patient Instructions below. Patient Instructions  Medication Instructions:  Your physician recommends that you continue on your current medications as directed. Please refer to the Current Medication list given to you today.  *If you need a refill on your cardiac medications before your next appointment, please call your pharmacy*  Lab Work: Today: CMET, CBC, Lipids, TSH  If you have labs (blood work) drawn today and your tests are completely normal, you will receive your results only by: Marland Kitchen MyChart Message (if you have MyChart) OR . A paper copy in the mail If you have any lab test that is abnormal or we need to change your treatment, we will call you to review the results.  Testing/Procedures: None ordered  Follow-Up: At [facility], you and your health needs are our priority.  As part of our continuing mission to provide you with exceptional heart care, we have created designated Provider Care Teams.  These Care Teams include your primary Cardiologist (physician) and Advanced Practice Providers (APPs -  Physician Assistants and Nurse Practitioners) who all work together to provide you with the care you need, when you need it.  Your next appointment:   12 months  The format for your next appointment:   In Person  Provider:   You may see Lauree Chandler, MD or  one of the following Advanced Practice  Providers on your designated Care Team:    Melina Copa, PA-C  Ermalinda Barrios, PA-C   Other Instructions  Your provider recommends that you maintain 150 minutes per week of moderate aerobic activity.    Sumner Boast, PA-C  03/24/2019 8:56 AM    [facility] [address], [place], [place]  [address] Phone: [phone]; Fax: [phone]

## 2019-03-19 ENCOUNTER — Other Ambulatory Visit: Payer: Self-pay

## 2019-03-19 ENCOUNTER — Ambulatory Visit (HOSPITAL_COMMUNITY)
Admission: RE | Admit: 2019-03-19 | Discharge: 2019-03-19 | Disposition: A | Discharge status: Home / Self Care | Payer: PPO | Source: Ambulatory Visit | Attending: Family Medicine | Admitting: Family Medicine

## 2019-03-19 DIAGNOSIS — Z1231 Encounter for screening mammogram for malignant neoplasm of breast: Secondary | ICD-10-CM | POA: Insufficient documentation

## 2019-03-24 ENCOUNTER — Other Ambulatory Visit: Payer: Self-pay

## 2019-03-24 ENCOUNTER — Ambulatory Visit: Payer: PPO | Admitting: Physician Assistant

## 2019-03-24 ENCOUNTER — Encounter: Payer: Self-pay | Admitting: Physician Assistant

## 2019-03-24 VITALS — BP 126/64 | HR 75 | Ht 63.5 in | Wt 141.8 lb

## 2019-03-24 DIAGNOSIS — E785 Hyperlipidemia, unspecified: Secondary | ICD-10-CM | POA: Diagnosis not present

## 2019-03-24 DIAGNOSIS — I2510 Atherosclerotic heart disease of native coronary artery without angina pectoris: Secondary | ICD-10-CM

## 2019-03-24 LAB — COMPREHENSIVE METABOLIC PANEL
ALT: 18 IU/L (ref 0–32)
AST: 24 IU/L (ref 0–40)
Albumin/Globulin Ratio: 1.4 (ref 1.2–2.2)
Albumin: 4.2 g/dL (ref 3.8–4.8)
Alkaline Phosphatase: 105 IU/L (ref 39–117)
BUN/Creatinine Ratio: 14 (ref 12–28)
BUN: 16 mg/dL (ref 8–27)
Bilirubin Total: 0.6 mg/dL (ref 0.0–1.2)
CO2: 21 mmol/L (ref 20–29)
Calcium: 9.8 mg/dL (ref 8.7–10.3)
Chloride: 107 mmol/L — ABNORMAL HIGH (ref 96–106)
Creatinine, Ser: 1.16 mg/dL — ABNORMAL HIGH (ref 0.57–1.00)
GFR calc Af Amer: 56 mL/min/{1.73_m2} — ABNORMAL LOW (ref >59)
GFR calc non Af Amer: 48 mL/min/{1.73_m2} — ABNORMAL LOW (ref >59)
Globulin, Total: 2.9 g/dL (ref 1.5–4.5)
Glucose: 96 mg/dL (ref 65–99)
Potassium: 4.3 mmol/L (ref 3.5–5.2)
Sodium: 144 mmol/L (ref 134–144)
Total Protein: 7.1 g/dL (ref 6.0–8.5)

## 2019-03-24 LAB — CBC
Hematocrit: 35.9 % (ref 34.0–46.6)
Hemoglobin: 12.3 g/dL (ref 11.1–15.9)
MCH: 32.4 pg (ref 26.6–33.0)
MCHC: 34.3 g/dL (ref 31.5–35.7)
MCV: 95 fL (ref 79–97)
Platelets: 315 10*3/uL (ref 150–450)
RBC: 3.8 x10E6/uL (ref 3.77–5.28)
RDW: 11.9 % (ref 11.7–15.4)
WBC: 9.6 10*3/uL (ref 3.4–10.8)

## 2019-03-24 LAB — LIPID PANEL
Chol/HDL Ratio: 2.5 ratio (ref 0.0–4.4)
Cholesterol, Total: 160 mg/dL (ref 100–199)
HDL: 64 mg/dL (ref >39)
LDL Chol Calc (NIH): 75 mg/dL (ref 0–99)
Triglycerides: 119 mg/dL (ref 0–149)
VLDL Cholesterol Cal: 21 mg/dL (ref 5–40)

## 2019-03-24 LAB — TSH: TSH: 3.11 u[IU]/mL (ref 0.450–4.500)

## 2019-03-24 MED ORDER — FUROSEMIDE 40 MG PO TABS: 40.0000 mg | ORAL_TABLET | Freq: Every day | ORAL | 3 refills | Status: DC | PRN

## 2019-03-24 MED ORDER — NITROGLYCERIN 0.4 MG SL SUBL: 0.4000 mg | SUBLINGUAL_TABLET | SUBLINGUAL | 3 refills | Status: DC | PRN

## 2019-03-24 MED ORDER — LOVASTATIN 40 MG PO TABS: 40.0000 mg | ORAL_TABLET | Freq: Every day | ORAL | 3 refills | Status: DC

## 2019-03-24 MED ORDER — METOPROLOL SUCCINATE ER 25 MG PO TB24: 12.5000 mg | ORAL_TABLET | Freq: Every day | ORAL | 3 refills | Status: DC

## 2019-03-24 MED ORDER — ASPIRIN EC 81 MG PO TBEC: 81.0000 mg | DELAYED_RELEASE_TABLET | Freq: Every day | ORAL | 3 refills | Status: DC

## 2019-03-24 NOTE — Patient Instructions (Addendum)
Medication Instructions:  Your physician recommends that you continue on your current medications as directed. Please refer to the Current Medication list given to you today.  *If you need a refill on your cardiac medications before your next appointment, please call your pharmacy*  Lab Work: Today: CMET, CBC, Lipids, TSH  If you have labs (blood work) drawn today and your tests are completely normal, you will receive your results only by: Marland Kitchen MyChart Message (if you have MyChart) OR . A paper copy in the mail If you have any lab test that is abnormal or we need to change your treatment, we will call you to review the results.  Testing/Procedures: None ordered  Follow-Up: At [facility], you and your health needs are our priority.  As part of our continuing mission to provide you with exceptional heart care, we have created designated Provider Care Teams.  These Care Teams include your primary Cardiologist (physician) and Advanced Practice Providers (APPs -  Physician Assistants and Nurse Practitioners) who all work together to provide you with the care you need, when you need it.  Your next appointment:   12 months  The format for your next appointment:   In Person  Provider:   You may see Lauree Chandler, MD or one of the following Advanced Practice Providers on your designated Care Team:    Melina Copa, PA-C  Ermalinda Barrios, PA-C   Other Instructions  Your provider recommends that you maintain 150 minutes per week of moderate aerobic activity.

## 2019-03-25 ENCOUNTER — Other Ambulatory Visit: Payer: Self-pay

## 2019-03-25 DIAGNOSIS — E785 Hyperlipidemia, unspecified: Secondary | ICD-10-CM

## 2019-04-23 ENCOUNTER — Ambulatory Visit: Payer: PPO | Admitting: Cardiovascular Disease

## 2019-05-19 DIAGNOSIS — Z1211 Encounter for screening for malignant neoplasm of colon: Secondary | ICD-10-CM | POA: Diagnosis not present

## 2019-06-10 DIAGNOSIS — R102 Pelvic and perineal pain: Secondary | ICD-10-CM | POA: Diagnosis not present

## 2019-06-10 DIAGNOSIS — M545 Low back pain: Secondary | ICD-10-CM | POA: Diagnosis not present

## 2019-06-24 ENCOUNTER — Other Ambulatory Visit (HOSPITAL_COMMUNITY): Payer: Self-pay | Admitting: Nurse Practitioner

## 2019-06-24 ENCOUNTER — Other Ambulatory Visit: Payer: Self-pay | Admitting: Nurse Practitioner

## 2019-06-24 DIAGNOSIS — M545 Low back pain, unspecified: Secondary | ICD-10-CM

## 2019-07-06 DIAGNOSIS — M545 Low back pain: Secondary | ICD-10-CM | POA: Diagnosis not present

## 2019-07-06 DIAGNOSIS — R102 Pelvic and perineal pain: Secondary | ICD-10-CM | POA: Diagnosis not present

## 2019-07-08 ENCOUNTER — Other Ambulatory Visit: Payer: Self-pay | Admitting: Nurse Practitioner

## 2019-07-15 ENCOUNTER — Encounter (HOSPITAL_COMMUNITY): Payer: Self-pay

## 2019-07-15 ENCOUNTER — Ambulatory Visit (HOSPITAL_COMMUNITY): Payer: PPO

## 2019-07-20 ENCOUNTER — Encounter: Payer: Self-pay | Admitting: Orthopedic Surgery

## 2019-07-20 ENCOUNTER — Ambulatory Visit: Payer: PPO | Admitting: Orthopedic Surgery

## 2019-07-20 ENCOUNTER — Other Ambulatory Visit: Payer: Self-pay

## 2019-07-20 ENCOUNTER — Ambulatory Visit: Payer: PPO

## 2019-07-20 VITALS — BP 134/74 | HR 77 | Ht 63.5 in | Wt 144.0 lb

## 2019-07-20 DIAGNOSIS — M25552 Pain in left hip: Secondary | ICD-10-CM | POA: Diagnosis not present

## 2019-07-20 DIAGNOSIS — M25551 Pain in right hip: Secondary | ICD-10-CM | POA: Diagnosis not present

## 2019-07-20 DIAGNOSIS — G8929 Other chronic pain: Secondary | ICD-10-CM

## 2019-07-20 MED ORDER — MELOXICAM 7.5 MG PO TABS: 7.5000 mg | ORAL_TABLET | Freq: Every day | ORAL | 5 refills | Status: DC

## 2019-07-20 NOTE — Progress Notes (Signed)
Angel Rivera  07/20/2019  Body mass index is 25.11 kg/m.   HISTORY SECTION :  Chief Complaint  Patient presents with  . Groin Pain    bilateral    HPI 69 year old female presents with a several week history of bilateral groin pain right greater than left no history of trauma no prior therapy although she did take a course of prednisone and Robaxin but it did not help.  She also has some mild pain in her lower back as well as the groin  She does note decreased ability to get out of a chair easily  Review of systems ringing of the ears muscle aches otherwise normal  Previous history of heart attack ROS   has a past medical history of Coronary artery disease, Hyperlipidemia, Hypotension, Myocardial infarction ([place]) (08/17/2009), and Tobacco user.   Past Surgical History:  Procedure Laterality Date  . ABDOMINAL HYSTERECTOMY  1980s   laparoscopic  . CARDIAC CATHETERIZATION N/A 12/30/2015   Procedure: Left Heart Cath and Coronary Angiography;  Surgeon: Burnell Blanks, MD;  Location: [place] [facility];  Service: Cardiovascular;  Laterality: N/A;  . CARDIAC CATHETERIZATION N/A 12/30/2015   Procedure: Coronary Stent Intervention;  Surgeon: Burnell Blanks, MD;  Location: [place] [facility];  Service: Cardiovascular;  Laterality: N/A;  . CORONARY ANGIOPLASTY WITH STENT PLACEMENT  08/17/2009   "1 stent"  . TUBAL LIGATION  ~ 1980    Body mass index is 25.11 kg/m.   Allergies  Allergen Reactions  . Atorvastatin Other (See Comments)    Leg pain     Current Outpatient Medications:  .  aspirin EC 81 MG tablet, Take 1 tablet (81 mg total) by mouth daily., Disp: 90 tablet, Rfl: 3 .  furosemide (LASIX) 40 MG tablet, Take 1 tablet (40 mg total) by mouth daily as needed for edema., Disp: 90 tablet, Rfl: 3 .  LORazepam (ATIVAN) 0.5 MG tablet, , Disp: , Rfl:  .  lovastatin (MEVACOR) 40 MG tablet, Take 1 tablet (40 mg total) by mouth at bedtime., Disp: 90 tablet, Rfl: 3 .   metoprolol succinate (TOPROL-XL) 25 MG 24 hr tablet, Take 0.5 tablets (12.5 mg total) by mouth daily., Disp: 45 tablet, Rfl: 3 .  nitroGLYCERIN (NITROSTAT) 0.4 MG SL tablet, Place 1 tablet (0.4 mg total) under the tongue every 5 (five) minutes as needed for chest pain. 3 DOSES MAX, Disp: 25 tablet, Rfl: 3 .  meloxicam (MOBIC) 7.5 MG tablet, Take 1 tablet (7.5 mg total) by mouth daily., Disp: 30 tablet, Rfl: 5   PHYSICAL EXAM SECTION: 1) BP 134/74   Pulse 77   Ht 5' 3.5" (1.613 m)   Wt 144 lb (65.3 kg)   BMI 25.11 kg/m   Body mass index is 25.11 kg/m. General appearance: Well-developed well-nourished no gross deformities  2) Cardiovascular normal pulse and perfusion , normal color   3) Neurologically deep tendon reflexes are equal and normal, no sensation loss or deficits no pathologic reflexes  4) Psychological: Awake alert and oriented x3 mood and affect normal  5) Skin no lacerations or ulcerations no nodularity no palpable masses, no erythema or nodularity  6) Musculoskeletal:   Left hip flexion is painful at 110 degrees she has pain with internal rotation at 90 degrees of flexion  Right hip only flexes to 90 degrees and that is painful and again pain with internal rotation is noted.  Flexion strength is normal bilaterally  She has no neurological deficits  Pulse and perfusion  normal distally   MEDICAL DECISION MAKING  A.  Encounter Diagnoses  Name Primary?  . Pain in left hip Yes  . Pain in right hip   . Chronic pain of both hips     B. DATA ANALYSED:  IMAGING: Independent interpretation of images: X-rays show arthritis of both hips right greater than left.  The joint space narrowing is not major at this time although her symptoms are  Orders: no  Outside records reviewed: no  C. MANAGEMENT   Recommend anti-inflammatories try for 1 month if no improvement then consider replacement  Meds ordered this encounter  Medications  . meloxicam (MOBIC) 7.5 MG  tablet    Sig: Take 1 tablet (7.5 mg total) by mouth daily.    Dispense:  30 tablet    Refill:  [place], MD  07/20/2019 2:19 PM

## 2019-08-19 ENCOUNTER — Other Ambulatory Visit: Payer: Self-pay

## 2019-08-19 ENCOUNTER — Ambulatory Visit (INDEPENDENT_AMBULATORY_CARE_PROVIDER_SITE_OTHER): Payer: PPO | Admitting: Orthopedic Surgery

## 2019-08-19 VITALS — Temp 98.1°F | Ht 63.0 in | Wt 144.0 lb

## 2019-08-19 DIAGNOSIS — G8929 Other chronic pain: Secondary | ICD-10-CM

## 2019-08-19 DIAGNOSIS — M25552 Pain in left hip: Secondary | ICD-10-CM

## 2019-08-19 DIAGNOSIS — M25551 Pain in right hip: Secondary | ICD-10-CM

## 2019-08-19 MED ORDER — DICLOFENAC SODIUM 75 MG PO TBEC: 75.0000 mg | DELAYED_RELEASE_TABLET | Freq: Two times a day (BID) | ORAL | 2 refills | Status: DC

## 2019-08-19 NOTE — Progress Notes (Signed)
Chief Complaint  Patient presents with  . Follow-up    Recheck on left hip.    Encounter Diagnosis  Name Primary?  . Chronic pain of both hips Yes   69 year old female with osteoarthritis both hips right worse than left did not improved with Mobic to any significant degree.  Still having that groin pain with a nagging ache.  She is not requiring any assistive devices at this time  We are going to change her medication and add physical therapy  Encounter Diagnosis  Name Primary?  . Chronic pain of both hips Yes    Meds ordered this encounter  Medications  . diclofenac (VOLTAREN) 75 MG EC tablet    Sig: Take 1 tablet (75 mg total) by mouth 2 (two) times daily with a meal.    Dispense:  60 tablet    Refill:  2

## 2019-08-19 NOTE — Patient Instructions (Signed)
Lets try diclofenac and therapy for the hip arthritis

## 2019-08-24 ENCOUNTER — Other Ambulatory Visit: Payer: PPO | Admitting: *Deleted

## 2019-08-24 ENCOUNTER — Other Ambulatory Visit: Payer: Self-pay

## 2019-08-24 DIAGNOSIS — E785 Hyperlipidemia, unspecified: Secondary | ICD-10-CM

## 2019-08-24 LAB — LIPID PANEL
Chol/HDL Ratio: 2.2 ratio (ref 0.0–4.4)
Cholesterol, Total: 142 mg/dL (ref 100–199)
HDL: 65 mg/dL (ref >39)
LDL Chol Calc (NIH): 57 mg/dL (ref 0–99)
Triglycerides: 109 mg/dL (ref 0–149)
VLDL Cholesterol Cal: 20 mg/dL (ref 5–40)

## 2019-09-02 ENCOUNTER — Other Ambulatory Visit: Payer: Self-pay

## 2019-09-02 ENCOUNTER — Ambulatory Visit (HOSPITAL_COMMUNITY): Payer: PPO | Attending: Orthopedic Surgery | Admitting: Physical Therapy

## 2019-09-02 ENCOUNTER — Encounter (HOSPITAL_COMMUNITY): Payer: Self-pay | Admitting: Physical Therapy

## 2019-09-02 DIAGNOSIS — M25551 Pain in right hip: Secondary | ICD-10-CM | POA: Diagnosis not present

## 2019-09-02 DIAGNOSIS — R2689 Other abnormalities of gait and mobility: Secondary | ICD-10-CM

## 2019-09-02 DIAGNOSIS — M25552 Pain in left hip: Secondary | ICD-10-CM | POA: Insufficient documentation

## 2019-09-02 NOTE — Therapy (Signed)
[facility] [address] [place], [place], [address] Phone: [phone]   Fax:  [phone]  Physical Therapy Evaluation  Patient Details  Name: Angel Rivera MRN: [id number] Date of Birth: 1950/12/11 Referring Provider (PT): Arther Abbott MD    Encounter Date: 09/02/2019  PT End of Session - 09/02/19 1026    Visit Number  1    Number of Visits  8    Date for PT Re-Evaluation  10/02/19    Authorization Type  [employer], no aut req, visits based on med nec    Progress Note Due on Visit  8    PT Start Time  0950    PT Stop Time  1030    PT Time Calculation (min)  40 min    Activity Tolerance  Patient limited by pain    Behavior During Therapy  [facility] for tasks assessed/performed       Past Medical History:  Diagnosis Date  . Coronary artery disease    NATIVE VESSEL (ICD-414.01)-cath 08/17/09 with subtotally occluded RCA, moderate disease LAD.  Marland Kitchen Hyperlipidemia   . Hypotension   . Myocardial infarction ([place]) 08/17/2009  . Tobacco user     Past Surgical History:  Procedure Laterality Date  . ABDOMINAL HYSTERECTOMY  1980s   laparoscopic  . CARDIAC CATHETERIZATION N/A 12/30/2015   Procedure: Left Heart Cath and Coronary Angiography;  Surgeon: Burnell Blanks, MD;  Location: [facility];  Service: Cardiovascular;  Laterality: N/A;  . CARDIAC CATHETERIZATION N/A 12/30/2015   Procedure: Coronary Stent Intervention;  Surgeon: Burnell Blanks, MD;  Location: [facility];  Service: Cardiovascular;  Laterality: N/A;  . CORONARY ANGIOPLASTY WITH STENT PLACEMENT  08/17/2009   "1 stent"  . TUBAL LIGATION  ~ 1980    There were no vitals filed for this visit.   Subjective Assessment - 09/02/19 0953    Subjective  Patient presents to physical therapy with complaint of chronic bilateral hip pain. Patient says she noticed increased pain beginning last August, but says that pain has gotten progressively  worse since. Says she has had imaging which showed arthritis in both hips. Says she cannot get comfortable, and has a lot of pain, notably in groin area. Says she has rearranged her pillows at home but has a hard time sleeping. Says she is taking voltaren and other anti-inflammatory but nothing seems to really help.    Limitations  Sitting;Standing;Lifting;Walking;House hold activities    How long can you stand comfortably?  15 min at most    How long can you walk comfortably?  10-15 min    Diagnostic tests  xrays    Patient Stated Goals  help subside this (pain), bear it more    Currently in Pain?  Yes    Pain Score  1     Pain Location  Hip    Pain Orientation  Right;Left;Anterior    Pain Descriptors / Indicators  Aching;Sharp;Stabbing    Pain Type  Chronic pain    Pain Onset  More than a month ago    Pain Frequency  Intermittent    Aggravating Factors   standing, walking, WB    Pain Relieving Factors  sitting, resting    Effect of Pain on Daily Activities  Limits         [facility] PT Assessment - 09/02/19 0001      Assessment   Medical Diagnosis  Chronic pain of both hips    Referring  Provider (PT)  Arther Abbott MD     Onset Date/Surgical Date  12/06/18    Next MD Visit  09/28/19    Prior Therapy  No       Precautions   Precautions  None      Restrictions   Weight Bearing Restrictions  Yes      Balance Screen   Has the patient fallen in the past 6 months  No    Has the patient had a decrease in activity level because of a fear of falling?   No    Is the patient reluctant to leave their home because of a fear of falling?   No      Home Film/video editor residence    Living Arrangements  Alone      Prior Function   Level of [place]  Retired      Associate Professor   Overall Cognitive Status  Within Functional Limits for tasks assessed      Observation/Other Assessments   Focus on Therapeutic Outcomes (FOTO)   48%  limited       Sensation   Light Touch  Appears Intact      ROM / Strength   AROM / PROM / Strength  AROM;Strength      AROM   Overall AROM Comments  Mod restriciton in bilateral hip flexion and IR       Strength   Strength Assessment Site  Hip;Knee;Ankle    Right/Left Hip  Right;Left    Right Hip Flexion  3-/5    Right Hip Extension  3-/5    Right Hip ABduction  4-/5    Left Hip Flexion  3-/5    Left Hip Extension  3-/5    Left Hip ABduction  4-/5    Right/Left Knee  Right;Left    Right Knee Flexion  5/5    Right Knee Extension  5/5    Left Knee Flexion  5/5    Left Knee Extension  5/5    Right/Left Ankle  Right;Left    Right Ankle Dorsiflexion  5/5    Left Ankle Dorsiflexion  5/5      Flexibility   Soft Tissue Assessment /Muscle Length  --   Mod restriciton in bilateral psoas      Palpation   Palpation comment  Mod tenderness to palpation about bilateral psoas, trochanters       Transfers   Five time sit to stand comments   14.17 sec no UE assist      Ambulation/Gait   Ambulation/Gait  Yes    Ambulation/Gait Assistance  7: Independent    Assistive device  None    Gait Pattern  Decreased step length - right;Decreased step length - left;Decreased stride length;Decreased hip/knee flexion - right;Decreased hip/knee flexion - left    Ambulation Surface  Level;Indoor      Balance   Balance Assessed  Yes      Static Standing Balance   Static Standing Balance -  Activities   Tandam Stance - Right Leg;Tandam Stance - Left Leg    Static Standing - Comment/# of Minutes  25 sec; 25 sec                Objective measurements completed on examination: See above findings.      [facility] Adult PT Treatment/Exercise - 09/02/19 0001      Exercises   Exercises  Knee/Hip  Knee/Hip Exercises: Supine   Bridges  Both;5 sets    Bridges Limitations  5 sec hold    Other Supine Knee/Hip Exercises  ab set 5 x 5 sec hold             PT Education - 09/02/19  0957    Education Details  on evaluation findings, POC and HEP    Person(s) Educated  Patient    Methods  Explanation;Handout    Comprehension  Verbalized understanding       PT Short Term Goals - 09/02/19 1715      PT SHORT TERM GOAL #1   Title  Patient will be independent with initial HEP and self-management strategies to improve functional outcomes    Time  2    Period  Weeks    Status  New    Target Date  09/18/19        PT Long Term Goals - 09/02/19 1715      PT LONG TERM GOAL #1   Title  Patient will report at least 60% overall improvement in subjective complaint to indicate improvement in ability to perform ADLs.    Time  4    Period  Weeks    Status  New    Target Date  10/02/19      PT LONG TERM GOAL #2   Title  Patient will have equal to or > 4/5 MMT throughout BLE to improve ability to perform functional mobility, stair ambulation and ADLs.    Time  4    Period  Weeks    Status  New    Target Date  10/02/19      PT LONG TERM GOAL #3   Title  Patient will improve FOTO score by at least 10% to indicate improvement in functional outcomes    Time  4    Period  Weeks    Status  New    Target Date  10/02/19             Plan - 09/02/19 1706    Clinical Impression Statement  Patient is a 69 y.o. female who presents to physical therapy with complaint of chronic bilateral hip pain. Patient demonstrates decreased strength, ROM restriction, increased tenderness to palpation and gait abnormalities which are likely contributing to symptoms of pain and are negatively impacting patient ability to perform ADLs and functional mobility tasks. Patient will benefit from skilled physical therapy services to address these deficits to reduce pain, and improve level of function with ADLs and functional mobility tasks    Personal Factors and Comorbidities  Time since onset of injury/illness/exacerbation    Examination-Activity Limitations  Stand;Lift;Locomotion  Level;Bend;Transfers;Sleep;Squat;Stairs    Examination-Participation Restrictions  Yard Work;Community Activity;Laundry;Volunteer;Driving;Cleaning    Stability/Clinical Decision Making  Stable/Uncomplicated    Clinical Decision Making  Low    Rehab Potential  Good    PT Frequency  2x / week    PT Duration  4 weeks    PT Treatment/Interventions  ADLs/Self Care Home Management;Aquatic Therapy;Biofeedback;Cryotherapy;Electrical Stimulation;Contrast Bath;Therapeutic exercise;Compression bandaging;Taping;Vasopneumatic Device;Orthotic Fit/Training;Joint Manipulations;Energy conservation;Spinal Manipulations;Dry needling;Splinting;Manual techniques;Functional mobility training;Ultrasound;Parrafin;Fluidtherapy;Therapeutic activities;Patient/family education;Manual lymph drainage;Wheelchair mobility training;Neuromuscular re-education;Gait training;DME Instruction;Balance training;Scar mobilization;Passive range of motion;Stair training;Moist Heat;Traction;Iontophoresis 4mg /ml Dexamethasone    PT Next Visit Plan  Review goals and HEP. Add manual to address tip flexor restricions (psoas release). Progress hip strenght and mobility as tolerated. try heel slides,  supine clamshell, thomas stretching. Progress to standing hip and core strenghening as able. Follow up about aquatic therapy.  PT Home Exercise Plan  09/02/19: ab set, bridge    Consulted and Agree with Plan of Care  Patient       Patient will benefit from skilled therapeutic intervention in order to improve the following deficits and impairments:  Abnormal gait, Pain, Decreased mobility, Decreased activity tolerance, Decreased endurance, Decreased range of motion, Decreased strength, Hypomobility, Difficulty walking, Impaired flexibility  Visit Diagnosis: Pain in right hip  Pain in left hip  Other abnormalities of gait and mobility     Problem List Patient Active Problem List   Diagnosis Date Noted  . Fracture, Colles, left, closed  09/06/17 09/23/2017  . Diarrhea 05/16/2017  . Encounter for HCV screening test for high risk patient 05/16/2017  . Chest pain 06/17/2016  . Unstable angina ([place]) 12/30/2015  . HYPERCHOLESTEROLEMIA 09/09/2009  . MYOCARDIAL INFARCTION, 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  . CAD (coronary artery disease) 09/09/2009  . HYPOTENSION 09/09/2009    5:21 PM, 09/02/19 Josue Hector PT DPT  Physical Therapist with [facility]  [phone] [address] [place], [place], [address] Phone: [phone]   Fax:  [phone]  Name: Angel Rivera MRN: [id number] Date of Birth: 1951/04/16

## 2019-09-07 ENCOUNTER — Telehealth (HOSPITAL_COMMUNITY): Payer: Self-pay | Admitting: Physical Therapy

## 2019-09-07 NOTE — Telephone Encounter (Signed)
pt called to cx this appt due to she has to attend a funeral

## 2019-09-08 ENCOUNTER — Ambulatory Visit (HOSPITAL_COMMUNITY): Payer: PPO | Admitting: Physical Therapy

## 2019-09-10 ENCOUNTER — Ambulatory Visit (HOSPITAL_COMMUNITY): Payer: PPO | Admitting: Physical Therapy

## 2019-09-11 ENCOUNTER — Ambulatory Visit (HOSPITAL_COMMUNITY): Payer: PPO | Attending: Orthopedic Surgery | Admitting: Physical Therapy

## 2019-09-11 ENCOUNTER — Encounter (HOSPITAL_COMMUNITY): Payer: Self-pay | Admitting: Physical Therapy

## 2019-09-11 ENCOUNTER — Other Ambulatory Visit: Payer: Self-pay

## 2019-09-11 DIAGNOSIS — R2689 Other abnormalities of gait and mobility: Secondary | ICD-10-CM | POA: Insufficient documentation

## 2019-09-11 DIAGNOSIS — M25552 Pain in left hip: Secondary | ICD-10-CM | POA: Diagnosis not present

## 2019-09-11 DIAGNOSIS — M25551 Pain in right hip: Secondary | ICD-10-CM | POA: Diagnosis not present

## 2019-09-11 NOTE — Therapy (Signed)
[facility] [place], [place], [address] Phone: [phone]   Fax:  [phone]  Physical Therapy Treatment  Patient Details  Name: Angel Rivera MRN: [id number] Date of Birth: 09-Apr-1951 Referring Provider (PT): Arther Abbott MD    Encounter Date: 09/11/2019  PT End of Session - 09/11/19 1152    Visit Number  2    Number of Visits  8    Date for PT Re-Evaluation  10/02/19    Authorization Type  [employer], no aut req, visits based on med nec    Progress Note Due on Visit  8    PT Start Time  1133    PT Stop Time  1215    PT Time Calculation (min)  42 min    Activity Tolerance  Patient limited by pain    Behavior During Therapy  [facility] for tasks assessed/performed       Past Medical History:  Diagnosis Date  . Coronary artery disease    NATIVE VESSEL (ICD-414.01)-cath 08/17/09 with subtotally occluded RCA, moderate disease LAD.  Marland Kitchen Hyperlipidemia   . Hypotension   . Myocardial infarction ([place]) 08/17/2009  . Tobacco user     Past Surgical History:  Procedure Laterality Date  . ABDOMINAL HYSTERECTOMY  1980s   laparoscopic  . CARDIAC CATHETERIZATION N/A 12/30/2015   Procedure: Left Heart Cath and Coronary Angiography;  Surgeon: Burnell Blanks, MD;  Location: [facility];  Service: Cardiovascular;  Laterality: N/A;  . CARDIAC CATHETERIZATION N/A 12/30/2015   Procedure: Coronary Stent Intervention;  Surgeon: Burnell Blanks, MD;  Location: [facility];  Service: Cardiovascular;  Laterality: N/A;  . CORONARY ANGIOPLASTY WITH STENT PLACEMENT  08/17/2009   "1 stent"  . TUBAL LIGATION  ~ 1980    There were no vitals filed for this visit.  Subjective Assessment - 09/11/19 1139    Subjective  Pt states that she just wants the pain to stop.    Limitations  Sitting;Standing;Lifting;Walking;House hold activities    How long can you stand comfortably?  15 min at most    How long can you  walk comfortably?  10-15 min    Diagnostic tests  xrays    Patient Stated Goals  help subside this (pain), bear it more    Currently in Pain?  Yes    Pain Score  7     Pain Location  Groin    Pain Orientation  Right;Left    Pain Descriptors / Indicators  Stabbing    Pain Type  Chronic pain    Pain Onset  More than a month ago    Pain Frequency  Intermittent    Aggravating Factors   not sure    Pain Relieving Factors  not sure    Effect of Pain on Daily Activities  limits                       [facility] Adult PT Treatment/Exercise - 09/11/19 0001      Transfers   Five time sit to stand comments   --      Ambulation/Gait   Ambulation/Gait  --    Ambulation/Gait Assistance  --    Assistive device  --    Gait Pattern  --      Exercises   Exercises  Knee/Hip      Knee/Hip Exercises: Stretches   Other Knee/Hip Stretches  modified thomas stretch  1 x 1  minute       Knee/Hip Exercises: Supine   Heel Slides  Both;10 reps    Bridges  Both;10 reps    Bridges Limitations  5 sec hold    Other Supine Knee/Hip Exercises  ab set 5 x 5 sec hold    Other Supine Knee/Hip Exercises  clam, bent knee lift x 10 each       Manual Therapy   Manual Therapy  Soft tissue mobilization    Manual therapy comments  completed seperate from all other aspects of treatment     Soft tissue mobilization  To decrease pain and tightness.              PT Education - 09/11/19 1154    Education Details  HEP    Person(s) Educated  Patient    Methods  Handout;Verbal cues;Demonstration;Explanation    Comprehension  Verbalized understanding;Returned demonstration       PT Short Term Goals - 09/11/19 1138      PT SHORT TERM GOAL #1   Title  Patient will be independent with initial HEP and self-management strategies to improve functional outcomes    Time  2    Period  Weeks    Status  On-going    Target Date  09/18/19        PT Long Term Goals - 09/11/19 1138      PT LONG TERM  GOAL #1   Title  Patient will report at least 60% overall improvement in subjective complaint to indicate improvement in ability to perform ADLs.    Time  4    Period  Weeks    Status  On-going      PT LONG TERM GOAL #2   Title  Patient will have equal to or > 4/5 MMT throughout BLE to improve ability to perform functional mobility, stair ambulation and ADLs.    Time  4    Period  Weeks    Status  On-going      PT LONG TERM GOAL #3   Title  Patient will improve FOTO score by at least 10% to indicate improvement in functional outcomes    Time  4    Period  Weeks    Status  On-going            Plan - 09/11/19 1152    Clinical Impression Statement  Evaluation and goals reviewed with pt.  Pt has not completed hEP on a regular basis; therapist explained the importance of completing HEP.  Updated HEP as stated and began manual for pain control    Personal Factors and Comorbidities  Time since onset of injury/illness/exacerbation    Examination-Activity Limitations  Stand;Lift;Locomotion Level;Bend;Transfers;Sleep;Squat;Stairs    Examination-Participation Restrictions  Yard Work;Community Activity;Laundry;Volunteer;Driving;Cleaning    Stability/Clinical Decision Making  Stable/Uncomplicated    Rehab Potential  Good    PT Frequency  2x / week    PT Duration  4 weeks    PT Treatment/Interventions  ADLs/Self Care Home Management;Aquatic Therapy;Biofeedback;Cryotherapy;Electrical Stimulation;Contrast Bath;Therapeutic exercise;Compression bandaging;Taping;Vasopneumatic Device;Orthotic Fit/Training;Joint Manipulations;Energy conservation;Spinal Manipulations;Dry needling;Splinting;Manual techniques;Functional mobility training;Ultrasound;Parrafin;Fluidtherapy;Therapeutic activities;Patient/family education;Manual lymph drainage;Wheelchair mobility training;Neuromuscular re-education;Gait training;DME Instruction;Balance training;Scar mobilization;Passive range of motion;Stair training;Moist  Heat;Traction;Iontophoresis 4mg /ml Dexamethasone    PT Next Visit Plan  Begin supine hip ab/adduction,  Progress to standing hip and core strenghening as able. Follow up about aquatic therapy.    PT Home Exercise Plan  09/02/19: ab set, bridge; 5/&:  heelslides, clams, thomal stretch, bent knee raise.    Consulted  and Agree with Plan of Care  Patient       Patient will benefit from skilled therapeutic intervention in order to improve the following deficits and impairments:  Abnormal gait, Pain, Decreased mobility, Decreased activity tolerance, Decreased endurance, Decreased range of motion, Decreased strength, Hypomobility, Difficulty walking, Impaired flexibility  Visit Diagnosis: Pain in right hip  Pain in left hip  Other abnormalities of gait and mobility     Problem List Patient Active Problem List   Diagnosis Date Noted  . Fracture, Col[place], left, closed 09/06/17 09/23/2017  . Diarrhea 05/16/2017  . Encounter for HCV screening test for high risk patient 05/16/2017  . Chest pain 06/17/2016  . Unstable angina ([place]) 12/30/2015  . HYPERCHOLESTEROLEMIA 09/09/2009  . MYOCARDIAL INFARCTION, 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  . CAD (coronary artery disease) 09/09/2009  . HYPOTENSION 09/09/2009    Rayetta Humphrey, PT CLT [phone] 09/11/2019, 12:14 PM  [facility] [address] [place], [place], [address] Phone: [phone]   Fax:  [phone]  Name: Angel Rivera MRN: [id number] Date of Birth: 1951/01/28

## 2019-09-15 ENCOUNTER — Ambulatory Visit (HOSPITAL_COMMUNITY): Payer: PPO | Admitting: Physical Therapy

## 2019-09-15 ENCOUNTER — Encounter (HOSPITAL_COMMUNITY): Payer: Self-pay | Admitting: Physical Therapy

## 2019-09-15 ENCOUNTER — Other Ambulatory Visit: Payer: Self-pay

## 2019-09-15 DIAGNOSIS — M25551 Pain in right hip: Secondary | ICD-10-CM | POA: Diagnosis not present

## 2019-09-15 DIAGNOSIS — R2689 Other abnormalities of gait and mobility: Secondary | ICD-10-CM

## 2019-09-15 DIAGNOSIS — M25552 Pain in left hip: Secondary | ICD-10-CM

## 2019-09-15 NOTE — Therapy (Signed)
[place], [place], [address] Phone: [phone]   Fax:  [phone]  Physical Therapy Treatment  Patient Details  Name: Angel Rivera MRN: [id number] Date of Birth: Aug 31, 1950 Referring Provider (PT): Arther Abbott MD    Encounter Date: 09/15/2019  PT End of Session - 09/15/19 1128    Visit Number  3    Number of Visits  8    Date for PT Re-Evaluation  10/02/19    Authorization Type  [employer], no aut req, visits based on med nec    Progress Note Due on Visit  8    PT Start Time  1120    PT Stop Time  1200    PT Time Calculation (min)  40 min    Activity Tolerance  Patient tolerated treatment well    Behavior During Therapy  [facility] for tasks assessed/performed       Past Medical History:  Diagnosis Date  . Coronary artery disease    NATIVE VESSEL (ICD-414.01)-cath 08/17/09 with subtotally occluded RCA, moderate disease LAD.  Marland Kitchen Hyperlipidemia   . Hypotension   . Myocardial infarction ([place]) 08/17/2009  . Tobacco user     Past Surgical History:  Procedure Laterality Date  . ABDOMINAL HYSTERECTOMY  1980s   laparoscopic  . CARDIAC CATHETERIZATION N/A 12/30/2015   Procedure: Left Heart Cath and Coronary Angiography;  Surgeon: Burnell Blanks, MD;  Location: [place] [facility];  Service: Cardiovascular;  Laterality: N/A;  . CARDIAC CATHETERIZATION N/A 12/30/2015   Procedure: Coronary Stent Intervention;  Surgeon: Burnell Blanks, MD;  Location: [place] [facility];  Service: Cardiovascular;  Laterality: N/A;  . CORONARY ANGIOPLASTY WITH STENT PLACEMENT  08/17/2009   "1 stent"  . TUBAL LIGATION  ~ 1980    There were no vitals filed for this visit.  Subjective Assessment - 09/15/19 1126    Subjective  Patient says she has been very sore lately, and has been having pain in both knees as well. Patient says she has been using ice lately, has not been using voltaren lately.    Limitations   Sitting;Standing;Lifting;Walking;House hold activities    How long can you stand comfortably?  15 min at most    How long can you walk comfortably?  10-15 min    Diagnostic tests  xrays    Patient Stated Goals  help subside this (pain), bear it more    Currently in Pain?  Yes    Pain Score  7     Pain Location  Hip    Pain Orientation  Right;Left    Pain Descriptors / Indicators  Aching    Pain Type  Chronic pain    Pain Onset  More than a month ago    Pain Frequency  Constant    Pain Score  7    Pain Location  Knee    Pain Orientation  Right;Left    Pain Descriptors / Indicators  Aching    Pain Type  Chronic pain    Pain Onset  More than a month ago    Pain Frequency  Constant                       [facility] Adult PT Treatment/Exercise - 09/15/19 0001      Knee/Hip Exercises: Stretches   Other Knee/Hip Stretches  modified thomas stretch  2 x 30" sec     Other Knee/Hip Stretches  supine adductor  stretch with strap 1 x 30" each       Knee/Hip Exercises: Supine   Heel Slides  Both;10 reps    Bridges  Both;10 reps    Bridges Limitations  5 sec hold    Other Supine Knee/Hip Exercises  ab set 10 x 5 sec hold, iso hip abduction with belt 10 x 5:    Other Supine Knee/Hip Exercises  clam, bent knee lift x 10 each       Manual Therapy   Manual Therapy  Soft tissue mobilization    Manual therapy comments  completed seperate from all other aspects of treatment     Soft tissue mobilization  manual muscle release to bilateral psoas               PT Short Term Goals - 09/11/19 1138      PT SHORT TERM GOAL #1   Title  Patient will be independent with initial HEP and self-management strategies to improve functional outcomes    Time  2    Period  Weeks    Status  On-going    Target Date  09/18/19        PT Long Term Goals - 09/11/19 1138      PT LONG TERM GOAL #1   Title  Patient will report at least 60% overall improvement in subjective complaint to  indicate improvement in ability to perform ADLs.    Time  4    Period  Weeks    Status  On-going      PT LONG TERM GOAL #2   Title  Patient will have equal to or > 4/5 MMT throughout BLE to improve ability to perform functional mobility, stair ambulation and ADLs.    Time  4    Period  Weeks    Status  On-going      PT LONG TERM GOAL #3   Title  Patient will improve FOTO score by at least 10% to indicate improvement in functional outcomes    Time  4    Period  Weeks    Status  On-going            Plan - 09/15/19 1415    Clinical Impression Statement  Patient tolerated treatment well overall today, but did note slight increased hip pain at end range with clamshell and hip abduction stretching. Patient educated to perform exercise through pain free ROM which improved these symptoms. Patient noted decreased discomfort with Thomas stretching for shorter durations. Patient cued on maintaining neutral lumbar spine and engaging TA for lumbar stability during modified Thomas stretching. Patient reports some improvement in mobility restrictions post treatment. Patient will continue to benefit from skilled therapy services to progress bilateral hip mobility and strengthening to reduce pain and improve functional mobility.    Personal Factors and Comorbidities  Time since onset of injury/illness/exacerbation    Examination-Activity Limitations  Stand;Lift;Locomotion Level;Bend;Transfers;Sleep;Squat;Stairs    Examination-Participation Restrictions  Yard Work;Community Activity;Laundry;Volunteer;Driving;Cleaning    Stability/Clinical Decision Making  Stable/Uncomplicated    Rehab Potential  Good    PT Frequency  2x / week    PT Duration  4 weeks    PT Treatment/Interventions  ADLs/Self Care Home Management;Aquatic Therapy;Biofeedback;Cryotherapy;Electrical Stimulation;Contrast Bath;Therapeutic exercise;Compression bandaging;Taping;Vasopneumatic Device;Orthotic Fit/Training;Joint  Manipulations;Energy conservation;Spinal Manipulations;Dry needling;Splinting;Manual techniques;Functional mobility training;Ultrasound;Parrafin;Fluidtherapy;Therapeutic activities;Patient/family education;Manual lymph drainage;Wheelchair mobility training;Neuromuscular re-education;Gait training;DME Instruction;Balance training;Scar mobilization;Passive range of motion;Stair training;Moist Heat;Traction;Iontophoresis 4mg /ml Dexamethasone    PT Next Visit Plan  Add supine hip adduction next visit. Progress to  standing hip and core strenghening as able.    PT Home Exercise Plan  09/02/19: ab set, bridge; 5/&:  heelslides, clams, thomal stretch, bent knee raise. 5/11: iso hip abduction, hip adduciton stretch w strap    Consulted and Agree with Plan of Care  Patient       Patient will benefit from skilled therapeutic intervention in order to improve the following deficits and impairments:  Abnormal gait, Pain, Decreased mobility, Decreased activity tolerance, Decreased endurance, Decreased range of motion, Decreased strength, Hypomobility, Difficulty walking, Impaired flexibility  Visit Diagnosis: Pain in right hip  Pain in left hip  Other abnormalities of gait and mobility     Problem List Patient Active Problem List   Diagnosis Date Noted  . Fracture, Colles, left, closed 09/06/17 09/23/2017  . Diarrhea 05/16/2017  . Encounter for HCV screening test for high risk patient 05/16/2017  . Chest pain 06/17/2016  . Unstable angina ([place]) 12/30/2015  . HYPERCHOLESTEROLEMIA 09/09/2009  . MYOCARDIAL INFARCTION, 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  . CAD (coronary artery disease) 09/09/2009  . HYPOTENSION 09/09/2009   2:23 PM, 09/15/19 Josue Hector PT DPT  Physical Therapist with [facility]  [phone] [place] [address] [place], [place], [address] Phone: [phone]   Fax:  [phone]  Name: BANEEN DUA MRN:  [id number] Date of Birth: November 22, 1950

## 2019-09-17 ENCOUNTER — Ambulatory Visit (HOSPITAL_COMMUNITY): Payer: PPO | Admitting: Physical Therapy

## 2019-09-17 ENCOUNTER — Other Ambulatory Visit: Payer: Self-pay

## 2019-09-17 DIAGNOSIS — M25551 Pain in right hip: Secondary | ICD-10-CM

## 2019-09-17 DIAGNOSIS — M25552 Pain in left hip: Secondary | ICD-10-CM

## 2019-09-17 DIAGNOSIS — R2689 Other abnormalities of gait and mobility: Secondary | ICD-10-CM

## 2019-09-17 NOTE — Therapy (Signed)
[place], [place], [address] Phone: [phone]   Fax:  [phone]  Physical Therapy Treatment  Patient Details  Name: Angel Rivera MRN: [id number] Date of Birth: January 18, 1951 Referring Provider (PT): Arther Abbott MD    Encounter Date: 09/17/2019  PT End of Session - 09/17/19 1525    Visit Number  4    Number of Visits  8    Date for PT Re-Evaluation  10/02/19    Authorization Type  [employer], no aut req, visits based on med nec    Progress Note Due on Visit  8    PT Start Time  1135    PT Stop Time  1218    PT Time Calculation (min)  43 min    Activity Tolerance  Patient tolerated treatment well    Behavior During Therapy  [facility] for tasks assessed/performed       Past Medical History:  Diagnosis Date  . Coronary artery disease    NATIVE VESSEL (ICD-414.01)-cath 08/17/09 with subtotally occluded RCA, moderate disease LAD.  Marland Kitchen Hyperlipidemia   . Hypotension   . Myocardial infarction ([place]) 08/17/2009  . Tobacco user     Past Surgical History:  Procedure Laterality Date  . ABDOMINAL HYSTERECTOMY  1980s   laparoscopic  . CARDIAC CATHETERIZATION N/A 12/30/2015   Procedure: Left Heart Cath and Coronary Angiography;  Surgeon: Burnell Blanks, MD;  Location: [place] [facility];  Service: Cardiovascular;  Laterality: N/A;  . CARDIAC CATHETERIZATION N/A 12/30/2015   Procedure: Coronary Stent Intervention;  Surgeon: Burnell Blanks, MD;  Location: [place] [facility];  Service: Cardiovascular;  Laterality: N/A;  . CORONARY ANGIOPLASTY WITH STENT PLACEMENT  08/17/2009   "1 stent"  . TUBAL LIGATION  ~ 1980    There were no vitals filed for this visit.  Subjective Assessment - 09/17/19 1138    Subjective  pt states she feels like it is better today. Only hurt a little yesterday.  STates the 2 shown last visit she has questions about.    Currently in Pain?  No/denies                         [facility] Adult PT Treatment/Exercise - 09/17/19 1343      Knee/Hip Exercises: Stretches   Other Knee/Hip Stretches  supine adductor stretch butterfly 30" each      Knee/Hip Exercises: Supine   Hip Adduction Isometric  10 reps    Bridges  15 reps    Bridges Limitations  5 sec hold    Other Supine Knee/Hip Exercises  ab set 10 x 5 sec hold, iso hip abduction with belt 10 x 5:    Other Supine Knee/Hip Exercises  clam, bent knee lift x 10 each       Knee/Hip Exercises: Prone   Other Prone Exercises  hip IR/ER      Manual Therapy   Manual Therapy  Soft tissue mobilization;Passive ROM    Manual therapy comments  completed seperate from all other aspects of treatment     Soft tissue mobilization  manual muscle release to bilateral psoas    Passive ROM  in prone hip rotator stretch 2X20"               PT Short Term Goals - 09/11/19 1138      PT SHORT TERM GOAL #1   Title  Patient will be independent with initial HEP  and self-management strategies to improve functional outcomes    Time  2    Period  Weeks    Status  On-going    Target Date  09/18/19        PT Long Term Goals - 09/11/19 1138      PT LONG TERM GOAL #1   Title  Patient will report at least 60% overall improvement in subjective complaint to indicate improvement in ability to perform ADLs.    Time  4    Period  Weeks    Status  On-going      PT LONG TERM GOAL #2   Title  Patient will have equal to or > 4/5 MMT throughout BLE to improve ability to perform functional mobility, stair ambulation and ADLs.    Time  4    Period  Weeks    Status  On-going      PT LONG TERM GOAL #3   Title  Patient will improve FOTO score by at least 10% to indicate improvement in functional outcomes    Time  4    Period  Weeks    Status  On-going            Plan - 09/17/19 1525    Clinical Impression Statement  Continued with current treatment plan with less pain voiced this  session and overall improvement.  Reveiwed stretches added last visit and shown butterfly stretch for adductors if she could not acquire a rope or strap for other stretch instructed.  Added hip adductor squeeze in supine as well with PROM completed to bilateral hip rotators..  Pt is extremely tight, especially IR bilaterally.  Psoas release completed bilaterally as well with good response.    Personal Factors and Comorbidities  Time since onset of injury/illness/exacerbation    Examination-Activity Limitations  Stand;Lift;Locomotion Level;Bend;Transfers;Sleep;Squat;Stairs    Examination-Participation Restrictions  Yard Work;Community Activity;Laundry;Volunteer;Driving;Cleaning    Stability/Clinical Decision Making  Stable/Uncomplicated    Rehab Potential  Good    PT Frequency  2x / week    PT Duration  4 weeks    PT Treatment/Interventions  ADLs/Self Care Home Management;Aquatic Therapy;Biofeedback;Cryotherapy;Electrical Stimulation;Contrast Bath;Therapeutic exercise;Compression bandaging;Taping;Vasopneumatic Device;Orthotic Fit/Training;Joint Manipulations;Energy conservation;Spinal Manipulations;Dry needling;Splinting;Manual techniques;Functional mobility training;Ultrasound;Parrafin;Fluidtherapy;Therapeutic activities;Patient/family education;Manual lymph drainage;Wheelchair mobility training;Neuromuscular re-education;Gait training;DME Instruction;Balance training;Scar mobilization;Passive range of motion;Stair training;Moist Heat;Traction;Iontophoresis 4mg /ml Dexamethasone    PT Next Visit Plan  Progress to standing hip and core strenghening as able.  continue manual to reduce tightness and overall dysfunction.    PT Home Exercise Plan  09/02/19: ab set, bridge; 5/&:  heelslides, clams, thomal stretch, bent knee raise. 5/11: iso hip abduction, hip adduciton stretch w strap    Consulted and Agree with Plan of Care  Patient       Patient will benefit from skilled therapeutic intervention in order to  improve the following deficits and impairments:  Abnormal gait, Pain, Decreased mobility, Decreased activity tolerance, Decreased endurance, Decreased range of motion, Decreased strength, Hypomobility, Difficulty walking, Impaired flexibility  Visit Diagnosis: Other abnormalities of gait and mobility  Pain in left hip  Pain in right hip     Problem List Patient Active Problem List   Diagnosis Date Noted  . Fracture, Colles, left, closed 09/06/17 09/23/2017  . Diarrhea 05/16/2017  . Encounter for HCV screening test for high risk patient 05/16/2017  . Chest pain 06/17/2016  . Unstable angina ([place]) 12/30/2015  . HYPERCHOLESTEROLEMIA 09/09/2009  . MYOCARDIAL INFARCTION, 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  . CAD (coronary artery disease) 09/09/2009  .  HYPOTENSION 09/09/2009   Teena Irani, PTA/CLT [phone]  Teena Irani 09/17/2019, 3:30 PM  [place] [address] [place], [place], [address] Phone: [phone]   Fax:  [phone]  Name: Angel Rivera MRN: [id number] Date of Birth: [birth year]

## 2019-09-22 ENCOUNTER — Ambulatory Visit (HOSPITAL_COMMUNITY): Payer: PPO | Admitting: Physical Therapy

## 2019-09-22 ENCOUNTER — Telehealth (HOSPITAL_COMMUNITY): Payer: Self-pay | Admitting: Physical Therapy

## 2019-09-22 NOTE — Telephone Encounter (Signed)
pt called to cx this appt due to she is not feeling well 

## 2019-09-24 ENCOUNTER — Other Ambulatory Visit: Payer: Self-pay

## 2019-09-24 ENCOUNTER — Encounter (HOSPITAL_COMMUNITY): Payer: Self-pay | Admitting: Physical Therapy

## 2019-09-24 ENCOUNTER — Ambulatory Visit (HOSPITAL_COMMUNITY): Payer: PPO | Admitting: Physical Therapy

## 2019-09-24 DIAGNOSIS — R2689 Other abnormalities of gait and mobility: Secondary | ICD-10-CM

## 2019-09-24 DIAGNOSIS — M25551 Pain in right hip: Secondary | ICD-10-CM | POA: Diagnosis not present

## 2019-09-24 DIAGNOSIS — M25552 Pain in left hip: Secondary | ICD-10-CM

## 2019-09-24 NOTE — Therapy (Signed)
[place], [place], [address] Phone: [phone]   Fax:  [phone]  Physical Therapy Treatment  Patient Details  Name: Angel Rivera MRN: [id number] Date of Birth: Feb 14, 1951 Referring Provider (PT): Arther Abbott MD    Encounter Date: 09/24/2019  PT End of Session - 09/24/19 1126    Visit Number  5    Number of Visits  8    Date for PT Re-Evaluation  10/02/19    Authorization Type  [employer], no aut req, visits based on med nec    Progress Note Due on Visit  8    PT Start Time  1120    PT Stop Time  1200    PT Time Calculation (min)  40 min    Activity Tolerance  Patient tolerated treatment well;Patient limited by pain    Behavior During Therapy  [facility] for tasks assessed/performed       Past Medical History:  Diagnosis Date  . Coronary artery disease    NATIVE VESSEL (ICD-414.01)-cath 08/17/09 with subtotally occluded RCA, moderate disease LAD.  Marland Kitchen Hyperlipidemia   . Hypotension   . Myocardial infarction ([place]) 08/17/2009  . Tobacco user     Past Surgical History:  Procedure Laterality Date  . ABDOMINAL HYSTERECTOMY  1980s   laparoscopic  . CARDIAC CATHETERIZATION N/A 12/30/2015   Procedure: Left Heart Cath and Coronary Angiography;  Surgeon: Burnell Blanks, MD;  Location: [place] [facility];  Service: Cardiovascular;  Laterality: N/A;  . CARDIAC CATHETERIZATION N/A 12/30/2015   Procedure: Coronary Stent Intervention;  Surgeon: Burnell Blanks, MD;  Location: [place] [facility];  Service: Cardiovascular;  Laterality: N/A;  . CORONARY ANGIOPLASTY WITH STENT PLACEMENT  08/17/2009   "1 stent"  . TUBAL LIGATION  ~ 1980    There were no vitals filed for this visit.  Subjective Assessment - 09/24/19 1124    Subjective  Says she is hurting today. Pain down to her knees she says. Trying her best to do HEP at home. Having trouble getting off of floor, so does exercise on bed.     Currently in Pain?  Yes    Pain Score  8     Pain Orientation  Right;Left    Pain Descriptors / Indicators  Aching    Pain Type  Chronic pain    Pain Onset  More than a month ago    Pain Frequency  Constant    Effect of Pain on Daily Activities  Limits         [facility] PT Assessment - 09/24/19 0001      Assessment   Next MD Visit  10/01/19                    [facility] Adult PT Treatment/Exercise - 09/24/19 0001      Knee/Hip Exercises: Stretches   Other Knee/Hip Stretches  modified thomas stretch  2 x 30" sec       Knee/Hip Exercises: Supine   Heel Slides  Both;10 reps    Hip Adduction Isometric  10 reps    Hip Adduction Isometric Limitations  5 sec hold     Bridges  15 reps    Bridges Limitations  5 sec hold    Other Supine Knee/Hip Exercises  ab march x10, iso hip abduction with belt 10 x 5:    Other Supine Knee/Hip Exercises  supine clam shell x 10 ea  Manual Therapy   Manual Therapy  Soft tissue mobilization    Manual therapy comments  completed seperate from all other aspects of treatment     Soft tissue mobilization  manual muscle release to bilateral psoas; active mm release bilateral psoas with heel slides               PT Short Term Goals - 09/11/19 1138      PT SHORT TERM GOAL #1   Title  Patient will be independent with initial HEP and self-management strategies to improve functional outcomes    Time  2    Period  Weeks    Status  On-going    Target Date  09/18/19        PT Long Term Goals - 09/11/19 1138      PT LONG TERM GOAL #1   Title  Patient will report at least 60% overall improvement in subjective complaint to indicate improvement in ability to perform ADLs.    Time  4    Period  Weeks    Status  On-going      PT LONG TERM GOAL #2   Title  Patient will have equal to or > 4/5 MMT throughout BLE to improve ability to perform functional mobility, stair ambulation and ADLs.    Time  4    Period  Weeks    Status  On-going       PT LONG TERM GOAL #3   Title  Patient will improve FOTO score by at least 10% to indicate improvement in functional outcomes    Time  4    Period  Weeks    Status  On-going            Plan - 09/24/19 1211    Clinical Impression Statement  Patient continues to have increased groin pain at end range hip ER and flexion. Patient tolerated ther ex well today and was cued for performing all activity through pain free ROM. Added active psoas mm release with heel sides to manual treatment today. Patient noted pain reduction post session from 8/10 to 4/10. Patient educated on activity modification and to avoid overdoing home activity that increases pain beyond 4-5/10. Discussed hip joint mechanics in relation to current ther ex. Patient will continue to benefit from skilled therapy services to progress hip mobility and strengthening to reduce pain and improve LOF with functional mobility.    Personal Factors and Comorbidities  Time since onset of injury/illness/exacerbation    Examination-Activity Limitations  Stand;Lift;Locomotion Level;Bend;Transfers;Sleep;Squat;Stairs    Examination-Participation Restrictions  Yard Work;Community Activity;Laundry;Volunteer;Driving;Cleaning    Stability/Clinical Decision Making  Stable/Uncomplicated    Rehab Potential  Good    PT Frequency  2x / week    PT Duration  4 weeks    PT Treatment/Interventions  ADLs/Self Care Home Management;Aquatic Therapy;Biofeedback;Cryotherapy;Electrical Stimulation;Contrast Bath;Therapeutic exercise;Compression bandaging;Taping;Vasopneumatic Device;Orthotic Fit/Training;Joint Manipulations;Energy conservation;Spinal Manipulations;Dry needling;Splinting;Manual techniques;Functional mobility training;Ultrasound;Parrafin;Fluidtherapy;Therapeutic activities;Patient/family education;Manual lymph drainage;Wheelchair mobility training;Neuromuscular re-education;Gait training;DME Instruction;Balance training;Scar mobilization;Passive  range of motion;Stair training;Moist Heat;Traction;Iontophoresis 4mg /ml Dexamethasone    PT Next Visit Plan  Progress to standing hip and core strenghening as able.  continue manual to reduce tightness and overall dysfunction.    PT Home Exercise Plan  09/02/19: ab set, bridge; 5/&:  heelslides, clams, thomal stretch, bent knee raise. 5/11: iso hip abduction, hip adduciton stretch w strap    Consulted and Agree with Plan of Care  Patient       Patient will benefit from skilled therapeutic intervention in  order to improve the following deficits and impairments:  Abnormal gait, Pain, Decreased mobility, Decreased activity tolerance, Decreased endurance, Decreased range of motion, Decreased strength, Hypomobility, Difficulty walking, Impaired flexibility  Visit Diagnosis: Other abnormalities of gait and mobility  Pain in left hip  Pain in right hip     Problem List Patient Active Problem List   Diagnosis Date Noted  . Fracture, Colles, left, closed 09/06/17 09/23/2017  . Diarrhea 05/16/2017  . Encounter for HCV screening test for high risk patient 05/16/2017  . Chest pain 06/17/2016  . Unstable angina ([place]) 12/30/2015  . HYPERCHOLESTEROLEMIA 09/09/2009  . MYOCARDIAL INFARCTION, 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  . CAD (coronary artery disease) 09/09/2009  . HYPOTENSION 09/09/2009   12:13 PM, 09/24/19 Josue Hector PT DPT  Physical Therapist with [facility]  [phone] [place] [address] [place], [place], [address] Phone: [phone]   Fax:  [phone]  Name: Angel Rivera MRN: [id number] Date of Birth: [birth year]

## 2019-09-29 ENCOUNTER — Other Ambulatory Visit: Payer: Self-pay

## 2019-09-29 ENCOUNTER — Ambulatory Visit (HOSPITAL_COMMUNITY): Payer: PPO | Admitting: Physical Therapy

## 2019-09-29 ENCOUNTER — Encounter (HOSPITAL_COMMUNITY): Payer: Self-pay | Admitting: Physical Therapy

## 2019-09-29 DIAGNOSIS — M25552 Pain in left hip: Secondary | ICD-10-CM

## 2019-09-29 DIAGNOSIS — R2689 Other abnormalities of gait and mobility: Secondary | ICD-10-CM

## 2019-09-29 DIAGNOSIS — M25551 Pain in right hip: Secondary | ICD-10-CM | POA: Diagnosis not present

## 2019-09-29 DIAGNOSIS — H6122 Impacted cerumen, left ear: Secondary | ICD-10-CM | POA: Diagnosis not present

## 2019-09-29 DIAGNOSIS — G4700 Insomnia, unspecified: Secondary | ICD-10-CM | POA: Diagnosis not present

## 2019-09-29 NOTE — Therapy (Signed)
[place], [place], [address] Phone: [phone]   Fax:  [phone]  Physical Therapy Treatment  Patient Details  Name: Angel Rivera MRN: [id number] Date of Birth: [birth year] Referring Provider (PT): Arther Abbott MD    Encounter Date: 09/29/2019  PT End of Session - 09/29/19 1609    Visit Number  6    Number of Visits  8    Date for PT Re-Evaluation  10/02/19    Authorization Type  [employer], no aut req, visits based on med nec    Progress Note Due on Visit  8    PT Start Time  1602    PT Stop Time  1640    PT Time Calculation (min)  38 min    Activity Tolerance  Patient tolerated treatment well;Patient limited by pain    Behavior During Therapy  [facility] for tasks assessed/performed       Past Medical History:  Diagnosis Date  . Coronary artery disease    NATIVE VESSEL (ICD-414.01)-cath 08/17/09 with subtotally occluded RCA, moderate disease LAD.  Marland Kitchen Hyperlipidemia   . Hypotension   . Myocardial infarction ([place]) 08/17/2009  . Tobacco user     Past Surgical History:  Procedure Laterality Date  . ABDOMINAL HYSTERECTOMY  1980s   laparoscopic  . CARDIAC CATHETERIZATION N/A 12/30/2015   Procedure: Left Heart Cath and Coronary Angiography;  Surgeon: Burnell Blanks, MD;  Location: [place] [facility];  Service: Cardiovascular;  Laterality: N/A;  . CARDIAC CATHETERIZATION N/A 12/30/2015   Procedure: Coronary Stent Intervention;  Surgeon: Burnell Blanks, MD;  Location: [place] [facility];  Service: Cardiovascular;  Laterality: N/A;  . CORONARY ANGIOPLASTY WITH STENT PLACEMENT  08/17/2009   "1 stent"  . TUBAL LIGATION  ~ 1980    There were no vitals filed for this visit.  Subjective Assessment - 09/29/19 1604    Subjective  Patient reported that her hip pain was not as bad today rates it as a 5/10.    Currently in Pain?  Yes    Pain Score  5     Pain Location  Hip    Pain  Orientation  Right;Left    Pain Descriptors / Indicators  Aching    Pain Type  Chronic pain    Pain Onset  More than a month ago                        [facility] Adult PT Treatment/Exercise - 09/29/19 0001      Knee/Hip Exercises: Stretches   Other Knee/Hip Stretches  modified thomas stretch  2 x 30" sec       Knee/Hip Exercises: Supine   Heel Slides  Both;10 reps    Hip Adduction Isometric  10 reps    Hip Adduction Isometric Limitations  5 sec hold     Bridges  15 reps    Bridges Limitations  5 sec hold    Other Supine Knee/Hip Exercises  ab march x10, iso hip abduction with belt 10 x 5:      Manual Therapy   Manual Therapy  Muscle Energy Technique;Joint mobilization    Manual therapy comments  completed seperate from all other aspects of treatment     Joint Mobilization  long axis hip distraction 4x30'' each LE    Muscle Energy Technique  manual muscle release to bilateral psoas; active mm release bilateral psoas with heel slides  PT Short Term Goals - 09/11/19 1138      PT SHORT TERM GOAL #1   Title  Patient will be independent with initial HEP and self-management strategies to improve functional outcomes    Time  2    Period  Weeks    Status  On-going    Target Date  09/18/19        PT Long Term Goals - 09/11/19 1138      PT LONG TERM GOAL #1   Title  Patient will report at least 60% overall improvement in subjective complaint to indicate improvement in ability to perform ADLs.    Time  4    Period  Weeks    Status  On-going      PT LONG TERM GOAL #2   Title  Patient will have equal to or > 4/5 MMT throughout BLE to improve ability to perform functional mobility, stair ambulation and ADLs.    Time  4    Period  Weeks    Status  On-going      PT LONG TERM GOAL #3   Title  Patient will improve FOTO score by at least 10% to indicate improvement in functional outcomes    Time  4    Period  Weeks    Status  On-going             Plan - 09/29/19 1655    Clinical Impression Statement  Patient with a  decreased pain level at start of session compared to at last appointment. Trialed long axis hip distraction with minimal to no change in reported pain level during session. Patient reporting difficulty with lifting lower extremity onto the mat table this date. Patient reported feeling good during exercises, but still reports high level of difficulty if she tries to externally rotate her hips. Plan to re-assess next session.    Personal Factors and Comorbidities  Time since onset of injury/illness/exacerbation    Examination-Activity Limitations  Stand;Lift;Locomotion Level;Bend;Transfers;Sleep;Squat;Stairs    Examination-Participation Restrictions  Yard Work;Community Activity;Laundry;Volunteer;Driving;Cleaning    Stability/Clinical Decision Making  Stable/Uncomplicated    Rehab Potential  Good    PT Frequency  2x / week    PT Duration  4 weeks    PT Treatment/Interventions  ADLs/Self Care Home Management;Aquatic Therapy;Biofeedback;Cryotherapy;Electrical Stimulation;Contrast Bath;Therapeutic exercise;Compression bandaging;Taping;Vasopneumatic Device;Orthotic Fit/Training;Joint Manipulations;Energy conservation;Spinal Manipulations;Dry needling;Splinting;Manual techniques;Functional mobility training;Ultrasound;Parrafin;Fluidtherapy;Therapeutic activities;Patient/family education;Manual lymph drainage;Wheelchair mobility training;Neuromuscular re-education;Gait training;DME Instruction;Balance training;Scar mobilization;Passive range of motion;Stair training;Moist Heat;Traction;Iontophoresis 4mg /ml Dexamethasone    PT Next Visit Plan  Re-assess. Progress to standing hip and core strenghening as able.  continue manual to reduce tightness and overall dysfunction.    PT Home Exercise Plan  09/02/19: ab set, bridge; 5/&:  heelslides, clams, thomal stretch, bent knee raise. 5/11: iso hip abduction, hip adduciton stretch w  strap    Consulted and Agree with Plan of Care  Patient       Patient will benefit from skilled therapeutic intervention in order to improve the following deficits and impairments:  Abnormal gait, Pain, Decreased mobility, Decreased activity tolerance, Decreased endurance, Decreased range of motion, Decreased strength, Hypomobility, Difficulty walking, Impaired flexibility  Visit Diagnosis: Other abnormalities of gait and mobility  Pain in left hip  Pain in right hip     Problem List Patient Active Problem List   Diagnosis Date Noted  . Fracture, Colles, left, closed 09/06/17 09/23/2017  . Diarrhea 05/16/2017  . Encounter for HCV screening test for high risk patient 05/16/2017  . Chest pain  06/17/2016  . Unstable angina ([place]) 12/30/2015  . HYPERCHOLESTEROLEMIA 09/09/2009  . MYOCARDIAL INFARCTION, 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  . CAD (coronary artery disease) 09/09/2009  . HYPOTENSION 09/09/2009   Clarene Critchley PT, DPT 4:57 PM, 09/29/19 [place], [place], [address] Phone: [phone]   Fax:  [phone]  Name: AMARRIA MINETTE MRN: [id number] Date of Birth: 1951/02/21

## 2019-09-30 ENCOUNTER — Ambulatory Visit: Payer: PPO | Admitting: Orthopedic Surgery

## 2019-10-01 ENCOUNTER — Ambulatory Visit (HOSPITAL_COMMUNITY): Payer: PPO | Admitting: Physical Therapy

## 2019-10-01 ENCOUNTER — Telehealth: Payer: Self-pay | Admitting: Radiology

## 2019-10-01 ENCOUNTER — Encounter (HOSPITAL_COMMUNITY): Payer: Self-pay | Admitting: Physical Therapy

## 2019-10-01 ENCOUNTER — Encounter: Payer: Self-pay | Admitting: Orthopedic Surgery

## 2019-10-01 ENCOUNTER — Ambulatory Visit: Payer: PPO | Admitting: Orthopedic Surgery

## 2019-10-01 ENCOUNTER — Other Ambulatory Visit: Payer: Self-pay

## 2019-10-01 VITALS — BP 135/70 | HR 75 | Ht 63.0 in | Wt 142.0 lb

## 2019-10-01 DIAGNOSIS — M1612 Unilateral primary osteoarthritis, left hip: Secondary | ICD-10-CM

## 2019-10-01 DIAGNOSIS — M1611 Unilateral primary osteoarthritis, right hip: Secondary | ICD-10-CM

## 2019-10-01 DIAGNOSIS — M25552 Pain in left hip: Secondary | ICD-10-CM

## 2019-10-01 DIAGNOSIS — R2689 Other abnormalities of gait and mobility: Secondary | ICD-10-CM

## 2019-10-01 DIAGNOSIS — M25551 Pain in right hip: Secondary | ICD-10-CM | POA: Diagnosis not present

## 2019-10-01 MED ORDER — TRAMADOL-ACETAMINOPHEN 37.5-325 MG PO TABS: 1.0000 | ORAL_TABLET | ORAL | 0 refills | Status: AC | PRN

## 2019-10-01 MED ORDER — NABUMETONE 500 MG PO TABS: 500.0000 mg | ORAL_TABLET | Freq: Two times a day (BID) | ORAL | 5 refills | Status: DC

## 2019-10-01 NOTE — Patient Instructions (Signed)
I will schedule the hip injections and then call you to let you know, they do them for [place] at [facility]

## 2019-10-01 NOTE — Therapy (Signed)
[facility] [address] [place], [place], [address] Phone: [phone]   Fax:  [phone]  Physical Therapy Treatment/ Discharge Summary   Patient Details  Name: Angel Rivera MRN: [id number] Date of Birth: 06-19-1950 Referring Provider (PT): Arther Abbott MD    Encounter Date: 10/01/2019   PHYSICAL THERAPY DISCHARGE SUMMARY  Visits from Start of Care: 7  Current functional level related to goals / functional outcomes: See below   Remaining deficits: See below    Education / Equipment: See assessment   Plan: Patient agrees to discharge.  Patient goals were partially met. Patient is being discharged due to lack of progress.  ?????       PT End of Session - 10/01/19 1124    Visit Number  7    Number of Visits  8    Date for PT Re-Evaluation  10/02/19    Authorization Type  [employer], no aut req, visits based on med nec    Progress Note Due on Visit  8    PT Start Time  1120    PT Stop Time  1155    PT Time Calculation (min)  35 min    Activity Tolerance  Patient tolerated treatment well;Patient limited by pain    Behavior During Therapy  [facility] for tasks assessed/performed       Past Medical History:  Diagnosis Date  . Coronary artery disease    NATIVE VESSEL (ICD-414.01)-cath 08/17/09 with subtotally occluded RCA, moderate disease LAD.  Marland Kitchen Hyperlipidemia   . Hypotension   . Myocardial infarction ([place]) 08/17/2009  . Tobacco user     Past Surgical History:  Procedure Laterality Date  . ABDOMINAL HYSTERECTOMY  1980s   laparoscopic  . CARDIAC CATHETERIZATION N/A 12/30/2015   Procedure: Left Heart Cath and Coronary Angiography;  Surgeon: Burnell Blanks, MD;  Location: [place] [facility];  Service: Cardiovascular;  Laterality: N/A;  . CARDIAC CATHETERIZATION N/A 12/30/2015   Procedure: Coronary Stent Intervention;  Surgeon: Burnell Blanks, MD;  Location: [place] [facility];  Service:  Cardiovascular;  Laterality: N/A;  . CORONARY ANGIOPLASTY WITH STENT PLACEMENT  08/17/2009   "1 stent"  . TUBAL LIGATION  ~ 1980    There were no vitals filed for this visit.  Subjective Assessment - 10/01/19 1123    Subjective  Patient says she is much better today than she has been. Says pain is still there but not as bad. Says HEP is going well. Patient says she feels about 65% improvement since starting therapy.    Limitations  Sitting;Standing;Lifting;Walking;House hold activities    How long can you stand comfortably?  15 min at most    How long can you walk comfortably?  10-15 min    Patient Stated Goals  help subside this (pain), bear it more    Currently in Pain?  Yes    Pain Score  4     Pain Location  Hip    Pain Orientation  Right;Left    Pain Descriptors / Indicators  Aching    Pain Type  Chronic pain    Pain Onset  More than a month ago    Pain Frequency  Constant    Effect of Pain on Daily Activities  Limits         [facility] PT Assessment - 10/01/19 0001      Assessment   Medical Diagnosis  Chronic pain of both hips    Referring Provider (PT)  Arther Abbott MD     Onset Date/Surgical Date  12/06/18    Next MD Visit  10/01/19    Prior Therapy  No       Precautions   Precautions  None      Restrictions   Weight Bearing Restrictions  No      Balance Screen   Has the patient fallen in the past 6 months  No      [place] residence      Prior Function   Level of Independence  Independent    Vocation  Retired      Associate Professor   Overall Cognitive Status  Within Functional Limits for tasks assessed      Observation/Other Assessments   Focus on Therapeutic Outcomes (FOTO)   41% limited    was 48% limited      AROM   AROM Assessment Site  Hip    Right/Left Hip  Right;Left    Right Hip Extension  0    Right Hip Flexion  85   pain   Left Hip Extension  0    Left Hip Flexion  85   pain     Strength   Right Hip  Flexion  3+/5   with pain through limited ROM    Right Hip Extension  3-/5   pain   Right Hip ABduction  4+/5   tested in supine, unable to lay on side    Left Hip Flexion  3+/5   with pain through limited ROM    Left Hip Extension  3-/5   with pain    Left Hip ABduction  4+/5   tested in supine, unable to lay on side    Right Knee Flexion  5/5    Right Knee Extension  5/5    Left Knee Flexion  5/5    Left Knee Extension  5/5    Right Ankle Dorsiflexion  5/5    Left Ankle Dorsiflexion  5/5      Ambulation/Gait   Ambulation/Gait  Yes    Ambulation/Gait Assistance  7: Independent    Assistive device  None    Gait Pattern  Decreased step length - right;Decreased step length - left;Decreased stride length;Decreased hip/knee flexion - right;Decreased hip/knee flexion - left    Ambulation Surface  Level;Indoor                              PT Short Term Goals - 10/01/19 1413      PT SHORT TERM GOAL #1   Title  Patient will be independent with initial HEP and self-management strategies to improve functional outcomes    Baseline  Reports compliance    Time  2    Period  Weeks    Status  Achieved    Target Date  09/18/19        PT Long Term Goals - 10/01/19 1413      PT LONG TERM GOAL #1   Title  Patient will report at least 60% overall improvement in subjective complaint to indicate improvement in ability to perform ADLs.    Baseline  Reports 65%    Time  4    Period  Weeks    Status  Achieved      PT LONG TERM GOAL #2   Title  Patient will have equal to or > 4/5 MMT throughout BLE to improve ability  to perform functional mobility, stair ambulation and ADLs.    Baseline  See MMT    Time  4    Period  Weeks    Status  Partially Met      PT LONG TERM GOAL #3   Title  Patient will improve FOTO score by at least 10% to indicate improvement in functional outcomes    Baseline  7% improved    Time  4    Period  Weeks    Status  Not Met             Plan - 10/01/19 1414    Clinical Impression Statement  Patient has made minimal to moderate progress toward therapy goals despite good effort and attendance. Patient currently with 1/1 short term and 2/3 long term therapy goals met/partially met. Patient does show some improvement in hip abduction strength, and slight improvement in reported functional level. Patient continues to be limited by elevated pain at end range with hip mobility in nearly all planes, significant AROM restriction, weakness most notably in hip flexion and extension and ongoing gait abnormalities related to these deficits. Discussed current progress with patient and will DC at this time due to max benefit reached. Patient agrees with this plan. Patient to transition to IND with HEP and f/u with referring provider for further consultation/ evaluation. Patient instructed to follow up with therapy services with any further questions or concerns.    Personal Factors and Comorbidities  Time since onset of injury/illness/exacerbation    Examination-Activity Limitations  Stand;Lift;Locomotion Level;Bend;Transfers;Sleep;Squat;Stairs    Examination-Participation Restrictions  Yard Work;Community Activity;Laundry;Volunteer;Driving;Cleaning    Stability/Clinical Decision Making  Stable/Uncomplicated    Rehab Potential  Good    PT Treatment/Interventions  ADLs/Self Care Home Management;Aquatic Therapy;Biofeedback;Cryotherapy;Electrical Stimulation;Contrast Bath;Therapeutic exercise;Compression bandaging;Taping;Vasopneumatic Device;Orthotic Fit/Training;Joint Manipulations;Energy conservation;Spinal Manipulations;Dry needling;Splinting;Manual techniques;Functional mobility training;Ultrasound;Parrafin;Fluidtherapy;Therapeutic activities;Patient/family education;Manual lymph drainage;Wheelchair mobility training;Neuromuscular re-education;Gait training;DME Instruction;Balance training;Scar mobilization;Passive range of motion;Stair  training;Moist Heat;Traction;Iontophoresis 48m/ml Dexamethasone    PT Next Visit [place] 09/02/19: ab set, bridge; 5/&:  heelslides, clams, thomal stretch, bent knee raise. 5/11: iso hip abduction, hip adduciton stretch w strap    Consulted and Agree with Plan of Care  Patient       Patient will benefit from skilled therapeutic intervention in order to improve the following deficits and impairments:  Abnormal gait, Pain, Decreased mobility, Decreased activity tolerance, Decreased endurance, Decreased range of motion, Decreased strength, Hypomobility, Difficulty walking, Impaired flexibility  Visit Diagnosis: Other abnormalities of gait and mobility  Pain in left hip  Pain in right hip     Problem List Patient Active Problem List   Diagnosis Date Noted  . Fracture, Col[place], left, closed 09/06/17 09/23/2017  . Diarrhea 05/16/2017  . Encounter for HCV screening test for high risk patient 05/16/2017  . Chest pain 06/17/2016  . Unstable angina ([place] 12/30/2015  . HYPERCHOLESTEROLEMIA 09/09/2009  . MYOCARDIAL INFARCTION, 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  . CAD (coronary artery disease) 09/09/2009  . HYPOTENSION 09/09/2009    2:30 PM, 10/01/19 CJosue HectorPT DPT  [facility] [phone] [address]Phone: [phone]  Fax:  [phone] Name: CMARENE GILLIAMMRN: [id number] of Birth: [birth year]

## 2019-10-01 NOTE — Progress Notes (Signed)
Chief Complaint  Patient presents with  . Hip Pain    bilateral / not much relief with physical therapy     69 year old female we have followed now for 3 months with bilateral hip pain she has had meloxicam diclofenac has not had relief we sent her for physical therapy still no relief.  Right hip hurts worse than left  Patient has trouble getting in and out of her car even has some trouble driving when pushing on the gas.  She has some back pain on review of systems bilaterally sort of in the SI joint region  No radicular symptoms  Examination shows painful hip flexion and internal rotation although the hip flexion is 125 degrees bilaterally laterally.  We note increased pain with internal rotation  We discussed possible treatment options she is definitely against surgery right now  So we are going to change her medication to nabumetone and Ultracet  Return to get her to hip injections and see me two weeks after the injections Encounter Diagnoses  Name Primary?  . Unilateral primary osteoarthritis, right hip   . Unilateral primary osteoarthritis, left hip Yes    . Meds ordered this encounter  Medications  . traMADol-acetaminophen (ULTRACET) 37.5-325 MG tablet    Sig: Take 1 tablet by mouth every 4 (four) hours as needed for up to 7 days.    Dispense:  42 tablet    Refill:  0  . nabumetone (RELAFEN) 500 MG tablet    Sig: Take 1 tablet (500 mg total) by mouth 2 (two) times daily.    Dispense:  60 tablet    Refill:  5

## 2019-10-01 NOTE — Telephone Encounter (Signed)
Call [facility] for bilateral hip injections

## 2019-10-02 NOTE — Telephone Encounter (Signed)
Bilateral hip injections scheduled for June 2nd at 930 arrive 15 minutes

## 2019-10-07 ENCOUNTER — Other Ambulatory Visit: Payer: Self-pay

## 2019-10-07 ENCOUNTER — Encounter (HOSPITAL_COMMUNITY): Payer: Self-pay

## 2019-10-07 ENCOUNTER — Ambulatory Visit (HOSPITAL_COMMUNITY)
Admission: RE | Admit: 2019-10-07 | Discharge: 2019-10-07 | Disposition: A | Discharge status: Home / Self Care | Payer: PPO | Source: Ambulatory Visit | Attending: Orthopedic Surgery | Admitting: Orthopedic Surgery

## 2019-10-07 ENCOUNTER — Other Ambulatory Visit: Payer: Self-pay | Admitting: Orthopedic Surgery

## 2019-10-07 DIAGNOSIS — M1611 Unilateral primary osteoarthritis, right hip: Secondary | ICD-10-CM

## 2019-10-07 DIAGNOSIS — M1612 Unilateral primary osteoarthritis, left hip: Secondary | ICD-10-CM

## 2019-10-07 DIAGNOSIS — M160 Bilateral primary osteoarthritis of hip: Secondary | ICD-10-CM | POA: Insufficient documentation

## 2019-10-07 MED ORDER — LIDOCAINE HCL (PF) 1 % IJ SOLN
INTRAMUSCULAR | Status: AC
  Administered 2019-10-07: 3 mL
  Filled 2019-10-07: qty 5

## 2019-10-07 MED ORDER — METHYLPREDNISOLONE ACETATE 40 MG/ML IJ SUSP
40.0000 mg | Freq: Once | INTRAMUSCULAR | Status: AC
  Administered 2019-10-07: 40 mg via INTRA_ARTICULAR

## 2019-10-07 MED ORDER — LIDOCAINE HCL (PF) 1 % IJ SOLN
INTRAMUSCULAR | Status: AC
  Administered 2019-10-07: 3 mL via INTRA_ARTICULAR
  Filled 2019-10-07: qty 10

## 2019-10-07 MED ORDER — IOHEXOL 300 MG/ML  SOLN
50.0000 mL | Freq: Once | INTRAMUSCULAR | Status: AC | PRN
  Administered 2019-10-07: 2 mL via INTRA_ARTICULAR

## 2019-10-07 MED ORDER — METHYLPREDNISOLONE ACETATE 40 MG/ML IJ SUSP
INTRAMUSCULAR | Status: AC
  Administered 2019-10-07: 40 mg via INTRA_ARTICULAR
  Filled 2019-10-07: qty 2

## 2019-10-07 MED ORDER — LIDOCAINE HCL (PF) 2 % IJ SOLN
INTRAMUSCULAR | Status: AC
  Administered 2019-10-07: 10 mL via INTRA_ARTICULAR
  Filled 2019-10-07: qty 10

## 2019-10-07 MED ORDER — POVIDONE-IODINE 10 % EX SOLN
CUTANEOUS | Status: AC
  Administered 2019-10-07: 1
  Filled 2019-10-07: qty 30

## 2019-10-07 MED ORDER — LIDOCAINE HCL (PF) 1 % IJ SOLN
5.0000 mL | Freq: Once | INTRAMUSCULAR | Status: AC
  Administered 2019-10-07: 5 mL

## 2019-10-07 NOTE — Procedures (Signed)
Preprocedure Dx: Primary osteoarthritis RIGHT hip Postprocedure Dx: Primary osteoarthritis RIGHT hip Procedure  Fluoroscopically guided RIGHT joint injection, therapeutic Radiologist:  Thornton Papas Anesthesia:  4 ml of 2% lidocaine Injectate:  40 mg DepoMedrol, 3 ml lidocaine 1% Fluoro time:  0 minutes 30 seconds EBL:   None Complications: None

## 2019-10-07 NOTE — Procedures (Signed)
Preprocedure Dx: Primary osteoarthritis of left hip Postprocedure Dx: Primary osteoarthritis of left hip Procedure  Fluoroscopically guided therapeutic LEFT joint injection Radiologist:  Thornton Papas Anesthesia:  6 ml 2% lidocaine, 3 ml of 1% lidocaine Injectate:  40mg  DepoMedrol, 48ml 1% lidocaine Fluoro time:  0 minutes 42 seconds EBL:   None Complications: None

## 2019-10-21 ENCOUNTER — Other Ambulatory Visit: Payer: Self-pay

## 2019-10-21 ENCOUNTER — Ambulatory Visit: Payer: PPO | Admitting: Orthopedic Surgery

## 2019-10-21 ENCOUNTER — Encounter: Payer: Self-pay | Admitting: Orthopedic Surgery

## 2019-10-21 VITALS — BP 145/76 | HR 78 | Ht 63.0 in | Wt 142.0 lb

## 2019-10-21 DIAGNOSIS — G8929 Other chronic pain: Secondary | ICD-10-CM

## 2019-10-21 DIAGNOSIS — M1612 Unilateral primary osteoarthritis, left hip: Secondary | ICD-10-CM

## 2019-10-21 DIAGNOSIS — M1611 Unilateral primary osteoarthritis, right hip: Secondary | ICD-10-CM | POA: Diagnosis not present

## 2019-10-21 DIAGNOSIS — M25552 Pain in left hip: Secondary | ICD-10-CM

## 2019-10-21 NOTE — Progress Notes (Signed)
Chief Complaint  Patient presents with  . Hip Pain    bilateral hips     Improved with injections, mild pinch right groin  hasnt been taking nabuimetone   Normal rom both hips  rec  Nabumetone and fu in 3 mos   Encounter Diagnoses  Name Primary?  . Unilateral primary osteoarthritis, right hip Yes  . Unilateral primary osteoarthritis, left hip   . Chronic pain of both hips

## 2019-10-21 NOTE — Patient Instructions (Signed)
Continue relafen/nabumetone

## 2019-12-07 ENCOUNTER — Telehealth: Payer: Self-pay | Admitting: Orthopedic Surgery

## 2019-12-07 DIAGNOSIS — M1611 Unilateral primary osteoarthritis, right hip: Secondary | ICD-10-CM

## 2019-12-07 NOTE — Telephone Encounter (Signed)
Lus[place] called this morning stating her hip is really hurting.  She said when she was last here, Dr. Aline Brochure told her that she couldn't have another injection for 3 months.  She says she is taking the Relafen but is still having pain in the hip.  She wants to know is there something else she can do, something else he can give her?  Any other suggestions?  Please advise the patient  Thanks  Injection was 10/07/19

## 2019-12-07 NOTE — Telephone Encounter (Signed)
Angel Rivera called this morning stating her hip is really hurting.  She said when she was last here, Dr. Aline Brochure told her that she couldn't have another injection for 3 months.  She says she is taking the Relafen but is still having pain in the hip.  She wants to know is there something else she can do, something else he can give her?  Any other suggestions?  Please advise the patient  Thanks

## 2019-12-08 ENCOUNTER — Other Ambulatory Visit: Payer: Self-pay | Admitting: Orthopedic Surgery

## 2019-12-08 DIAGNOSIS — G8929 Other chronic pain: Secondary | ICD-10-CM

## 2019-12-08 MED ORDER — ACETAMINOPHEN-CODEINE #3 300-30 MG PO TABS: 1.0000 | ORAL_TABLET | Freq: Four times a day (QID) | ORAL | 0 refills | Status: AC | PRN

## 2019-12-08 NOTE — Telephone Encounter (Signed)
Schedule another hip injection and I ll call her in some pain mediocation

## 2019-12-08 NOTE — Progress Notes (Signed)
Meds ordered this encounter  Medications  . acetaminophen-codeine (TYLENOL #3) 300-30 MG tablet    Sig: Take 1 tablet by mouth every 6 (six) hours as needed for up to 3 days for moderate pain.    Dispense:  12 tablet    Refill:  0    Angel Rivera has chronic pain both hips I believe the right is worse than the left and in the event she received an intra-articular injection got good relief but now complains of worsening pain  Recommend repeat intra-articular injection  Continue with her current diclofenac and add Tylenol 3  If that is unsatisfactory then we can make referrals for surgical anterior hip approach

## 2019-12-08 NOTE — Telephone Encounter (Signed)
I called to schedule Right hip injection Advised her meds sent in.   1015 on Friday Aug 6th. I called her to advise.  Made follow up for 3 weeks after injection

## 2019-12-09 ENCOUNTER — Telehealth: Payer: Self-pay | Admitting: Orthopedic Surgery

## 2019-12-09 DIAGNOSIS — M1612 Unilateral primary osteoarthritis, left hip: Secondary | ICD-10-CM

## 2019-12-09 NOTE — Telephone Encounter (Signed)
Patient called back to relay she did receive Amy's message, and that she appreciates it; said will be there as scheduled.

## 2019-12-09 NOTE — Telephone Encounter (Signed)
Patient called for Amy - inquiring about order for left hip injection as well, due to left hip pain, if both can be done at the same time.  Please advise patient.

## 2019-12-09 NOTE — Telephone Encounter (Signed)
Left message for her to advise they can do both, call me back to let me know she got the message.

## 2019-12-09 NOTE — Telephone Encounter (Signed)
I put in order/ ok per Dr Lemmie Evens to inject both I called central scheduling to see if they can do both on Friday or if need to RS   They can do it, same arrival time

## 2019-12-11 ENCOUNTER — Ambulatory Visit (HOSPITAL_COMMUNITY)
Admission: RE | Admit: 2019-12-11 | Discharge: 2019-12-11 | Disposition: A | Discharge status: Home / Self Care | Payer: PPO | Source: Ambulatory Visit | Attending: Orthopedic Surgery | Admitting: Orthopedic Surgery

## 2019-12-11 ENCOUNTER — Encounter (HOSPITAL_COMMUNITY): Payer: Self-pay

## 2019-12-11 ENCOUNTER — Other Ambulatory Visit: Payer: Self-pay

## 2019-12-11 DIAGNOSIS — M1611 Unilateral primary osteoarthritis, right hip: Secondary | ICD-10-CM | POA: Insufficient documentation

## 2019-12-11 DIAGNOSIS — M1612 Unilateral primary osteoarthritis, left hip: Secondary | ICD-10-CM | POA: Insufficient documentation

## 2019-12-11 DIAGNOSIS — M160 Bilateral primary osteoarthritis of hip: Secondary | ICD-10-CM | POA: Diagnosis not present

## 2019-12-11 MED ORDER — LIDOCAINE HCL (PF) 1 % IJ SOLN
INTRAMUSCULAR | Status: AC
  Administered 2019-12-11: 3 mL
  Filled 2019-12-11: qty 10

## 2019-12-11 MED ORDER — METHYLPREDNISOLONE ACETATE 40 MG/ML IJ SUSP
40.0000 mg | Freq: Once | INTRAMUSCULAR | Status: AC
  Administered 2019-12-11: 40 mg via INTRA_ARTICULAR

## 2019-12-11 MED ORDER — IOHEXOL 300 MG/ML  SOLN
30.0000 mL | Freq: Once | INTRAMUSCULAR | Status: AC | PRN
  Administered 2019-12-11: 4 mL via INTRA_ARTICULAR

## 2019-12-11 MED ORDER — POVIDONE-IODINE 10 % EX SOLN
CUTANEOUS | Status: AC
  Filled 2019-12-11: qty 30

## 2019-12-11 MED ORDER — LIDOCAINE HCL (PF) 2 % IJ SOLN
INTRAMUSCULAR | Status: AC
  Administered 2019-12-11: 10 mL
  Filled 2019-12-11: qty 10

## 2019-12-11 MED ORDER — LIDOCAINE 1% INJECTION FOR CIRCUMCISION
5.0000 mL | INJECTION | Freq: Once | INTRAVENOUS | Status: AC
  Administered 2019-12-11: 5 mL
  Filled 2019-12-11: qty 5

## 2019-12-11 NOTE — Procedures (Signed)
Preprocedure Dx: Osteoarthritis of bilateral hips, bilateral chronic hip pain Postprocedure Dx: Osteoarthritis of bilateral hips, bilateral chronic hip pain Procedure  Fluoroscopically guided therapeutic bilateral hip joint injection Radiologist:  Thornton Papas Anesthesia:  4 ml of 2% lidocaine in each hip Injectate:  40mg  Depo-Medrol and 3 ml of 1% lidocaine in each hip Fluoro time:  1 minutes 42 seconds EBL:   None Complications: None

## 2020-01-01 ENCOUNTER — Ambulatory Visit (INDEPENDENT_AMBULATORY_CARE_PROVIDER_SITE_OTHER): Payer: PPO | Admitting: Orthopedic Surgery

## 2020-01-01 ENCOUNTER — Other Ambulatory Visit: Payer: Self-pay | Admitting: Orthopedic Surgery

## 2020-01-01 ENCOUNTER — Encounter: Payer: Self-pay | Admitting: Orthopedic Surgery

## 2020-01-01 ENCOUNTER — Other Ambulatory Visit: Payer: Self-pay

## 2020-01-01 VITALS — BP 140/65 | HR 82 | Ht 63.0 in | Wt 142.0 lb

## 2020-01-01 DIAGNOSIS — M1612 Unilateral primary osteoarthritis, left hip: Secondary | ICD-10-CM

## 2020-01-01 DIAGNOSIS — M545 Low back pain, unspecified: Secondary | ICD-10-CM

## 2020-01-01 DIAGNOSIS — M1611 Unilateral primary osteoarthritis, right hip: Secondary | ICD-10-CM

## 2020-01-01 DIAGNOSIS — G8929 Other chronic pain: Secondary | ICD-10-CM | POA: Diagnosis not present

## 2020-01-01 DIAGNOSIS — M25551 Pain in right hip: Secondary | ICD-10-CM

## 2020-01-01 MED ORDER — CELECOXIB 100 MG PO CAPS: 100.0000 mg | ORAL_CAPSULE | Freq: Two times a day (BID) | ORAL | 0 refills | Status: DC

## 2020-01-01 NOTE — Progress Notes (Signed)
Progress Note   Patient ID: Angel Rivera, female   DOB: Oct 29, 1950, 69 y.o.   MRN: [id number]  Body mass index is 25.15 kg/m.  Chief Complaint  Patient presents with  . Hip Pain    bilateral not much relief with injection Right Groin pain     Encounter Diagnoses  Name Primary?  . Chronic pain of both hips Yes  . Unilateral primary osteoarthritis, right hip   . Unilateral primary osteoarthritis, left hip   . Lumbar pain     HPI 69 year old female with bilateral groin pain right greater than left no history of trauma has been treated with hip joint injections and anti-inflammatory medication as well as therapy for hip arthritis presents for follow-up after second round of intra-articular injection NSAID therapy Mobic and Voltaren  Angel Rivera is having more pain she said the second injection only lasted a week or so.  She notices that she is hard for her to bend over to get things off the floor and her groin pain has increased  ROS  Mild lower back pain some lateral leg pain but she says her groin pain is much more severe    BP 140/65   Pulse 82   Ht 5\' 3"  (1.6 m)   Wt 142 lb (64.4 kg)   BMI 25.15 kg/m   Physical Exam  Cannot really see a lot of gait disturbance here leg lengths are equal hip flexion on the right is painful as it is on the left but there is no loss of motion on flexion  Internal rotation is painful on the right much more than the left and is limited on the right much more than the left    [place] Encounter Diagnoses  Name Primary?  . Chronic pain of both hips Yes  . Unilateral primary osteoarthritis, right hip   . Unilateral primary osteoarthritis, left hip   . Lumbar pain     DATA ANALYSED:  C. MANAGEMENT   I talked to her about replacement versus trying a different anti-inflammatory.  She lives by herself and does not have anyone to help her at home and her hip surgery would have to be same day with someone to come and stay with  her.  She says there is nobody at this time  Recommend Celebrex 100 mg twice daily follow-up 6 weeks    Meds ordered this encounter  Medications  . celecoxib (CELEBREX) 100 MG capsule    Sig: Take 1 capsule (100 mg total) by mouth 2 (two) times daily.    Dispense:  60 capsule    Refill:  0    Arther Abbott, MD 01/01/2020 9:24 AM

## 2020-02-11 ENCOUNTER — Ambulatory Visit: Payer: PPO | Admitting: Orthopedic Surgery

## 2020-02-11 ENCOUNTER — Encounter: Payer: Self-pay | Admitting: Orthopedic Surgery

## 2020-02-11 ENCOUNTER — Other Ambulatory Visit: Payer: Self-pay

## 2020-02-11 ENCOUNTER — Ambulatory Visit: Payer: PPO

## 2020-02-11 VITALS — BP 127/69 | HR 85 | Ht 63.0 in | Wt 144.0 lb

## 2020-02-11 DIAGNOSIS — M545 Low back pain, unspecified: Secondary | ICD-10-CM

## 2020-02-11 MED ORDER — PREDNISONE 10 MG PO TABS: ORAL_TABLET | ORAL | 0 refills | Status: DC

## 2020-02-11 MED ORDER — DIAZEPAM 5 MG PO TABS: 5.0000 mg | ORAL_TABLET | Freq: Four times a day (QID) | ORAL | 0 refills | Status: AC | PRN

## 2020-02-11 NOTE — Patient Instructions (Signed)
Rest   Heat pad  No orders of the defined types were placed in this encounter.

## 2020-02-11 NOTE — Progress Notes (Signed)
Chief Complaint  Patient presents with  . Hip Pain    bilateral   . Back Pain    low back was bending over to pain felt pop pain low back / 02/09/20     Saturday injury   Ms. Aguillard was working on some railing bent over felt an acute pain in her lower back she is got a history of some lumbar disc disease in the past requiring injections and been doing well for several years until Saturday  Now complains of tightness in the right side of the lower back with back pain and some bilateral leg pain around the level of the knees  Review of systems no red flags  BP 127/69   Pulse 85   Ht 5\' 3"  (1.6 m)   Wt 144 lb (65.3 kg)   BMI 25.51 kg/m   She is awake alert and oriented x3 mood and affect are normal  He has tenderness across the entire small of the back but neurovascular exam is intact reflexes are normal sensory exam is normal strength exam is normal  X-rays show scoliosis which appears to be reactive and L5-S1 spondylolisthesis grade 1/2  Recommend following medications along with rest follow-up in 1 week  Meds ordered this encounter  Medications  . diazepam (VALIUM) 5 MG tablet    Sig: Take 1 tablet (5 mg total) by mouth every 6 (six) hours as needed for up to 5 days for anxiety.    Dispense:  20 tablet    Refill:  0  . predniSONE (DELTASONE) 10 MG tablet    Sig: 20 mg 3 times a day    Dispense:  30 tablet    Refill:  0

## 2020-02-12 ENCOUNTER — Other Ambulatory Visit: Payer: Self-pay | Admitting: Family Medicine

## 2020-02-12 DIAGNOSIS — Z1231 Encounter for screening mammogram for malignant neoplasm of breast: Secondary | ICD-10-CM

## 2020-02-18 ENCOUNTER — Ambulatory Visit: Payer: PPO | Admitting: Orthopedic Surgery

## 2020-03-14 ENCOUNTER — Telehealth: Payer: Self-pay | Admitting: Orthopedic Surgery

## 2020-03-14 DIAGNOSIS — M1612 Unilateral primary osteoarthritis, left hip: Secondary | ICD-10-CM

## 2020-03-14 DIAGNOSIS — M1611 Unilateral primary osteoarthritis, right hip: Secondary | ICD-10-CM

## 2020-03-14 NOTE — Telephone Encounter (Signed)
Patient called relaying that she would like to seek a second opinion with Dr Jean Rosenthal for her hip/back problem; said that she hopes Dr Aline Brochure would not mind. Please advise if best if we make a referral for her, or if patient would need to call on her own to request to schedule?

## 2020-03-15 NOTE — Telephone Encounter (Signed)
THAT'S GOOD   I TOLD HER SHE NEEDED A THA

## 2020-03-21 ENCOUNTER — Ambulatory Visit
Admission: RE | Admit: 2020-03-21 | Discharge: 2020-03-21 | Disposition: A | Discharge status: Home / Self Care | Payer: PPO | Source: Ambulatory Visit | Attending: Family Medicine | Admitting: Family Medicine

## 2020-03-21 ENCOUNTER — Other Ambulatory Visit: Payer: Self-pay

## 2020-03-21 DIAGNOSIS — Z1231 Encounter for screening mammogram for malignant neoplasm of breast: Secondary | ICD-10-CM | POA: Diagnosis not present

## 2020-03-23 ENCOUNTER — Ambulatory Visit: Payer: PPO | Admitting: Orthopaedic Surgery

## 2020-03-23 VITALS — Ht 63.0 in | Wt 144.0 lb

## 2020-03-23 DIAGNOSIS — M1612 Unilateral primary osteoarthritis, left hip: Secondary | ICD-10-CM | POA: Diagnosis not present

## 2020-03-23 DIAGNOSIS — M1611 Unilateral primary osteoarthritis, right hip: Secondary | ICD-10-CM | POA: Diagnosis not present

## 2020-03-23 MED ORDER — TRAMADOL HCL 50 MG PO TABS: 100.0000 mg | ORAL_TABLET | Freq: Four times a day (QID) | ORAL | 0 refills | Status: DC | PRN

## 2020-03-23 NOTE — Progress Notes (Signed)
Office Visit Note   Patient: Angel Rivera           Date of Birth: 31-Aug-1950           MRN: [id number] Visit Date: 03/23/2020              Requested by: Carole Civil, [place] [place],  [place] [address] PCP: Lemmie Evens, MD   Assessment & Plan: Visit Diagnoses:  1. Unilateral primary osteoarthritis, left hip   2. Unilateral primary osteoarthritis, right hip     Plan: I have recommended a total hip arthroplasty for least her right more painful hip and likely her left hip eventually. She agrees with this recommendation. I showed her her x-rays and went over hip replacement model explaining in detail what the surgery involves. I discussed the risk and benefits of surgery and what to expect from an intraoperative and postoperative course. I would likely have her on a baby aspirin twice a day for 2 weeks following surgery. All questions and concerns were answered and addressed. She does see her cardiologist on January 3 would like to wait till after then to schedule her right hip replacement. I agree with this. We will work on getting it scheduled in the interim. We will give her a call.  Follow-Up Instructions: Return for 2 weeks post-op. Orders:  No orders of the defined types were placed in this encounter.  Meds ordered this encounter  Medications  . traMADol (ULTRAM) 50 MG tablet    Sig: Take 2 tablets (100 mg total) by mouth every 6 (six) hours as needed.    Dispense:  40 tablet    Refill:  0      Procedures: No procedures performed   Clinical Data: No additional findings.   Subjective: Chief Complaint  Patient presents with  . Left Hip - Pain  . Right Hip - Pain  The patient is a 69 year old female that I am seeing for the first time. She has been dealing with bilateral hip pain for over a year now with the right worse than left. Has become more of a constant pain. She has seen Dr. Aline Brochure with orthopedics in [place] who is sent her our  way for further evaluation and treatment. He has tried conservative treatment for well over a year including physical therapy and anti-inflammatories. She is even had intra-articular steroid injections in her hips. She says at this point her pain can be 10 out of 10 at times. It is detrimentally affected her actives daily living, her quality of life and her mobility. She has had no other acute change in medical status. She is very active. She is not a diabetic and not a smoker. She has a history of heart stents. She is not on a blood thinner. She does see her cardiologist Dr. Angelena Form January 3. She would like to hold off on any surgery until after then.  HPI  Review of Systems She currently denies any headache, chest pain, shortness of breath, fever, chills, nausea, vomiting  Objective: Vital Signs: Ht 5\' 3"  (1.6 m)   Wt 144 lb (65.3 kg)   BMI 25.51 kg/m   Physical Exam She is alert and oriented x3 and in no acute distress Ortho Exam Examination of both hips shows significant pain with internal and external rotation. The pain is mainly groin. There is also stiffness with rotation of both hips. Specialty Comments:  No specialty comments available.  Imaging: No results found. X-rays in  the canopy system of the pelvis and both hips show severe osteoarthritis of both hips with evidence of previous femoral acetabular impingement. There is significant superior lateral joint space narrowing with para-articular osteophytes and flattening of the femoral head bilaterally.  PMFS History: Patient Active Problem List   Diagnosis Date Noted  . Unilateral primary osteoarthritis, 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  . Unilateral primary osteoarthritis, right hip 03/23/2020  . Fracture, [place], left, closed 09/06/17 09/23/2017  . Diarrhea 05/16/2017  . Encounter for HCV screening test for high risk patient 05/16/2017  . Chest pain 06/17/2016  . Unstable angina ([place]) 12/30/2015  . HYPERCHOLESTEROLEMIA 09/09/2009  .  MYOCARDIAL INFARCTION, 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  . CAD (coronary artery disease) 09/09/2009  . HYPOTENSION 09/09/2009   Past Medical History:  Diagnosis Date  . Coronary artery disease    NATIVE VESSEL (ICD-414.01)-cath 08/17/09 with subtotally occluded RCA, moderate disease LAD.  Marland Kitchen Hyperlipidemia   . Hypotension   . Myocardial infarction ([place]) 08/17/2009  . Tobacco user     Family History  Problem Relation Age of Onset  . Heart attack Mother   . Coronary artery disease Other   . Heart disease Father        bypass  . Heart disease Brother        bypass    Past Surgical History:  Procedure Laterality Date  . ABDOMINAL HYSTERECTOMY  1980s   laparoscopic  . CARDIAC CATHETERIZATION N/A 12/30/2015   Procedure: Left Heart Cath and Coronary Angiography;  Surgeon: Burnell Blanks, MD;  Location: [facility];  Service: Cardiovascular;  Laterality: N/A;  . CARDIAC CATHETERIZATION N/A 12/30/2015   Procedure: Coronary Stent Intervention;  Surgeon: Burnell Blanks, MD;  Location: [facility];  Service: Cardiovascular;  Laterality: N/A;  . CORONARY ANGIOPLASTY WITH STENT PLACEMENT  08/17/2009   "1 stent"  . TUBAL LIGATION  ~ 1980   Social History   Occupational History  . Not on file  Tobacco Use  . Smoking status: Former Smoker    Packs/day: 0.50    Years: 33.00    Pack years: 16.50    Types: Cigarettes    Quit date: 05/07/2013    Years since quitting: 6.8  . Smokeless tobacco: Never Used  Vaping Use  . Vaping Use: Never used  Substance and Sexual Activity  . Alcohol use: Yes    Comment: 12/30/2015 "might have a drink once/year"  . Drug use: No  . Sexual activity: Not on file

## 2020-04-06 DIAGNOSIS — M1611 Unilateral primary osteoarthritis, right hip: Secondary | ICD-10-CM | POA: Diagnosis not present

## 2020-04-06 DIAGNOSIS — E785 Hyperlipidemia, unspecified: Secondary | ICD-10-CM | POA: Diagnosis not present

## 2020-04-06 DIAGNOSIS — M1612 Unilateral primary osteoarthritis, left hip: Secondary | ICD-10-CM | POA: Diagnosis not present

## 2020-04-06 DIAGNOSIS — G4700 Insomnia, unspecified: Secondary | ICD-10-CM | POA: Diagnosis not present

## 2020-04-07 ENCOUNTER — Telehealth: Payer: Self-pay | Admitting: Orthopedic Surgery

## 2020-04-07 NOTE — Telephone Encounter (Signed)
Patient called back and was advised of what Dr. Aline Brochure said.  She was grateful for his opinion.

## 2020-04-07 NOTE — Telephone Encounter (Signed)
Yes   98 % chance without any complications

## 2020-04-07 NOTE — Telephone Encounter (Signed)
Angel Rivera wants to know "is the groin pain going to go away if she has the hip replacement."  Please advise  Thanks

## 2020-04-09 ENCOUNTER — Other Ambulatory Visit: Payer: Self-pay | Admitting: Physician Assistant

## 2020-04-10 ENCOUNTER — Other Ambulatory Visit: Payer: Self-pay | Admitting: Physician Assistant

## 2020-04-25 ENCOUNTER — Telehealth: Payer: Self-pay | Admitting: Orthopaedic Surgery

## 2020-04-25 MED ORDER — TRAMADOL HCL 50 MG PO TABS: 100.0000 mg | ORAL_TABLET | Freq: Four times a day (QID) | ORAL | 0 refills | Status: DC | PRN

## 2020-04-25 NOTE — Telephone Encounter (Signed)
Patient called needing Rx filled (Tramadol) 50 mg  Patient uses [facility] in [place] on [address]   [phone] The number to contact patient is [phone]

## 2020-05-09 ENCOUNTER — Encounter: Payer: Self-pay | Admitting: Cardiovascular Disease

## 2020-05-09 ENCOUNTER — Other Ambulatory Visit: Payer: Self-pay

## 2020-05-09 ENCOUNTER — Ambulatory Visit: Payer: PPO | Admitting: Cardiovascular Disease

## 2020-05-09 ENCOUNTER — Telehealth: Payer: Self-pay | Admitting: *Deleted

## 2020-05-09 ENCOUNTER — Telehealth: Payer: Self-pay | Admitting: Orthopaedic Surgery

## 2020-05-09 VITALS — BP 144/70 | HR 81 | Ht 63.0 in | Wt 145.0 lb

## 2020-05-09 DIAGNOSIS — E7800 Pure hypercholesterolemia, unspecified: Secondary | ICD-10-CM

## 2020-05-09 DIAGNOSIS — Z01810 Encounter for preprocedural cardiovascular examination: Secondary | ICD-10-CM | POA: Diagnosis not present

## 2020-05-09 DIAGNOSIS — I2510 Atherosclerotic heart disease of native coronary artery without angina pectoris: Secondary | ICD-10-CM | POA: Diagnosis not present

## 2020-05-09 NOTE — Patient Instructions (Signed)

## 2020-05-09 NOTE — Telephone Encounter (Signed)
Clearance request received today. I will bring form to Dr. Clifton Salts for appt today.

## 2020-05-09 NOTE — Progress Notes (Signed)
Chief Complaint  Patient presents with  . Follow-up    CAD     History of Present Illness: 70 yo female with history of CAD, hyperlipidemia and tobacco abuse here today for cardiac follow up. She was admitted to [facility] April 2011 with a NSTEMI secondary to subtotally occluded distal RCA which was treated with a drug eluting stent. She was also found to have moderate disease in the mid LAD (50%), mid Circumflex (40%), proximal RCA (40%). She stopped smoking February 2015. I saw her in August 2017 and she c/o pain in her upper back which was her prior anginal equivalent. Cardiac cath 12/30/15 with severe stenosis mid RCA treated with drug eluting stent. There was stable moderate disease in the mid LAD and mid Circumflex. She has not tolerated Lipitor but has tolerated Mevacor. She also did not tolerate Coreg but has tolerated low dose Toprol.   She is here today for follow up. The patient denies any chest pain, dyspnea, palpitations, lower extremity edema, orthopnea, PND, dizziness, near syncope or syncope. She is planning hip replacement surgery with Dr. Ninfa Linden in two week.    Primary Care Physician: Lemmie Evens, MD   Past Medical History:  Diagnosis Date  . Coronary artery disease    NATIVE VESSEL (ICD-414.01)-cath 08/17/09 with subtotally occluded RCA, moderate disease LAD.  Marland Kitchen Hyperlipidemia   . Hypotension   . Myocardial infarction ([place]) 08/17/2009  . Tobacco user     Past Surgical History:  Procedure Laterality Date  . ABDOMINAL HYSTERECTOMY  1980s   laparoscopic  . CARDIAC CATHETERIZATION N/A 12/30/2015   Procedure: Left Heart Cath and Coronary Angiography;  Surgeon: Burnell Blanks, MD;  Location: [facility];  Service: Cardiovascular;  Laterality: N/A;  . CARDIAC CATHETERIZATION N/A 12/30/2015   Procedure: Coronary Stent Intervention;  Surgeon: Burnell Blanks, MD;  Location: [facility];  Service: Cardiovascular;  Laterality: N/A;  . CORONARY  ANGIOPLASTY WITH STENT PLACEMENT  08/17/2009   "1 stent"  . TUBAL LIGATION  ~ 1980    Current Outpatient Medications  Medication Sig Dispense Refill  . aspirin EC 81 MG tablet Take 1 tablet (81 mg total) by mouth daily. 90 tablet 3  . furosemide (LASIX) 40 MG tablet Take 1 tablet (40 mg total) by mouth daily as needed for edema. 90 tablet 3  . LORazepam (ATIVAN) 0.5 MG tablet     . lovastatin (MEVACOR) 40 MG tablet Take 1 tablet (40 mg total) by mouth at bedtime. Please keep upcoming appt in January 2022 with Dr. Angelena Form before anymore refills. Thank you 90 tablet 0  . metoprolol succinate (TOPROL-XL) 25 MG 24 hr tablet Take 0.5 tablets (12.5 mg total) by mouth daily. Please keep upcoming appt in January 2022 with Dr. Angelena Form before anymore refills. Thank you 45 tablet 0  . nitroGLYCERIN (NITROSTAT) 0.4 MG SL tablet Place 1 tablet (0.4 mg total) under the tongue every 5 (five) minutes as needed for chest pain. 3 DOSES MAX 25 tablet 3  . traMADol (ULTRAM) 50 MG tablet Take 2 tablets (100 mg total) by mouth every 6 (six) hours as needed. 40 tablet 0   No current facility-administered medications for this visit.    Allergies  Allergen Reactions  . Atorvastatin Other (See Comments)    Leg pain    Social History   Socioeconomic History  . Marital status: Divorced    Spouse name: Not on file  . Number of children: Not on file  .  Years of education: Not on file  . Highest education level: Not on file  Occupational History  . Not on file  Tobacco Use  . Smoking status: Former Smoker    Packs/day: 0.50    Years: 33.00    Pack years: 16.50    Types: Cigarettes    Quit date: 05/07/2013    Years since quitting: 7.0  . Smokeless tobacco: Never Used  Vaping Use  . Vaping Use: Never used  Substance and Sexual Activity  . Alcohol use: Yes    Comment: 12/30/2015 "might have a drink once/year"  . Drug use: No  . Sexual activity: Not on file  Other Topics Concern  . Not on file   Social History Narrative   The patient has a almost 1-pack per day tobacco use     history.     She occasionally has alcohol-no abuse.     No illicit drug use   Divorced, 2 children   Social Determinants of Radio broadcast assistant Strain: Not on file  Food Insecurity: Not on file  Transportation Needs: Not on file  Physical Activity: Not on file  Stress: Not on file  Social Connections: Not on file  Intimate Partner Violence: Not on file    Family History  Problem Relation Age of Onset  . Heart attack Mother   . Coronary artery disease Other   . Heart disease Father        bypass  . Heart disease Brother        bypass    Review of Systems:  As stated in the HPI and otherwise negative.   BP (!) 144/70   Pulse 81   Ht 5\' 3"  (1.6 m)   Wt 145 lb (65.8 kg)   SpO2 99%   BMI 25.69 kg/m   Physical Examination:  General: Well developed, well nourished, NAD  HEENT: OP clear, mucus membranes moist  SKIN: warm, dry. No rashes. Neuro: No focal deficits  Musculoskeletal: Muscle strength 5/5 all ext  Psychiatric: Mood and affect normal  Neck: No JVD, no carotid bruits, no thyromegaly, no lymphadenopathy.  Lungs:Clear bilaterally, no wheezes, rhonci, crackles Cardiovascular: Regular rate and rhythm. No murmurs, gallops or rubs. Abdomen:Soft. Bowel sounds present. Non-tender.  Extremities: No lower extremity edema. Pulses are 2 + in the bilateral DP/PT.  EKG:  EKG is  ordered today. The ekg ordered today demonstrates sinus  Recent Labs: No results found for requested labs within last 8760 hours.   Lipid Panel    Component Value Date/Time   CHOL 142 08/24/2019 1015   TRIG 109 08/24/2019 1015   HDL 65 08/24/2019 1015   CHOLHDL 2.2 08/24/2019 1015   CHOLHDL 2.3 04/03/2016 0919   VLDL 24 04/03/2016 0919   LDLCALC 57 08/24/2019 1015   LDLDIRECT 113.7 10/15/2011 1105     Wt Readings from Last 3 Encounters:  05/09/20 145 lb (65.8 kg)  03/23/20 144 lb (65.3 kg)   02/11/20 144 lb (65.3 kg)     Other studies Reviewed: Additional studies/ records that were reviewed today include: . Review of the above records demonstrates:    Assessment and Plan:   1. CAD without angina: No chest pain. Continue ASA, beta blocker and statin.    2. Tobacco abuse, in remission: She stopped smoking in February 2015.   3. Hyperlipidemia: LDL at goal in April 2021. Continue statin.    4. Pre-operative cardiovascular examination: She has no angina, signs of heart failure or arrhythmias.  She can proceed with her planned hip replacement surgery.   Current medicines are reviewed at length with the patient today.  The patient does not have concerns regarding medicines.  The following changes have been made:  no change  Labs/ tests ordered today include:   Orders Placed This Encounter  Procedures  . EKG 12-Lead    Disposition:   FU with me 12 months.   Signed, Verne Carrow, MD 05/09/2020 4:08 PM    [facility] [address] [place], [place], [place]  [address] Phone: [phone]; Fax: [phone]

## 2020-05-09 NOTE — Telephone Encounter (Signed)
Pt called stating she is seeing her Heart doctor today.  She asked if anything was neeeded for her upcoming hip surgery from her cardiologist if so it needed to be faxed to that office for them.   # [phone] number for office

## 2020-05-09 NOTE — Telephone Encounter (Signed)
Faxed clearance request.

## 2020-05-13 ENCOUNTER — Other Ambulatory Visit: Payer: Self-pay | Admitting: Orthopaedic Surgery

## 2020-05-13 NOTE — Telephone Encounter (Signed)
Please advise 

## 2020-05-13 NOTE — Progress Notes (Signed)
Please place orders in epic pt. Has preop scheduled

## 2020-05-13 NOTE — Progress Notes (Signed)
PCP -  Cardiologist - Clearance 05-09-20 Dr. Lauree Chandler  Epic  PPM/ICD -  Device Orders -  Rep Notified -   Chest x-ray -  EKG - 05-09-20 epic Stress Test -  ECHO -  Cardiac Cath - 2017  Sleep Study -  CPAP -   Fasting Blood Sugar -  Checks Blood Sugar _____ times a day  Blood Thinner Instructions: Aspirin Instructions:  ERAS Protcol - PRE-SURGERY Ensure or G2-   COVID TEST- 05-20-20   Anesthesia review: MI CAD/ stents  Patient denies shortness of breath, fever, cough and chest pain at PAT appointment   All instructions explained to the patient, with a verbal understanding of the material. Patient agrees to go over the instructions while at home for a better understanding. Patient also instructed to self quarantine after being tested for COVID-19. The opportunity to ask questions was provided.

## 2020-05-13 NOTE — Patient Instructions (Signed)
DUE TO COVID-19 ONLY ONE VISITOR IS ALLOWED TO COME WITH YOU AND STAY IN THE WAITING ROOM ONLY DURING PRE OP AND PROCEDURE DAY OF SURGERY. THE 1 VISITOR  MAY VISIT WITH YOU AFTER SURGERY IN YOUR PRIVATE ROOM DURING VISITING HOURS ONLY!  YOU NEED TO HAVE A COVID 19 TEST ON__1-14_____ @_______ , THIS TEST MUST BE DONE BEFORE SURGERY,  COVID TESTING SITE [address] [place] [address], IT IS ON THE RIGHT GOING OUT [address] APPROXIMATELY  2 MINUTES PAST [employer] ON THE RIGHT. ONCE YOUR COVID TEST IS COMPLETED,  PLEASE BEGIN THE QUARANTINE INSTRUCTIONS AS OUTLINED IN YOUR HANDOUT.                Angel Rivera  05/13/2020   Your procedure is scheduled on: 05-24-20   Report to [facility] Main  Entrance   Report to admitting at 0930   AM     Call this number if you have problems the morning of surgery [phone]    Remember: NO SOLID FOOD AFTER MIDNIGHT THE NIGHT PRIOR TO SURGERY. NOTHING BY MOUTH EXCEPT CLEAR LIQUIDS UNTIL   0900 am . PLEASE FINISH ENSURE DRINK PER SURGEON ORDER  WHICH NEEDS TO BE COMPLETED AT  0900 am then nothing by mouth.    CLEAR LIQUID DIET   Foods Allowed                                                                     Coffee and tea, regular and decaf                              Plain Jell-O any favor except red or purple                                            Fruit ices (not with fruit pulp)                                      Iced Popsicles                                     Carbonated beverages, regular and diet                                    Cranberry, grape and apple juices Sports drinks like Gatorade Lightly seasoned clear broth or consume(fat free) Sugar, honey syrup   _____________________________________________________________________    BRUSH YOUR TEETH MORNING OF SURGERY AND RINSE YOUR MOUTH OUT, NO CHEWING GUM CANDY OR MINTS.     Take these medicines the morning of surgery with A SIP OF  WATER: metoprolol                                 You  may not have any metal on your body including hair pins and              piercings  Do not wear jewelry, make-up, lotions, powders or perfumes, deodorant             Do not wear nail polish on your fingernails.  Do not shave  48 hours prior to surgery.     Do not bring valuables to the hospital. [place].  Contacts, dentures or bridgework may not be worn into surgery.       Patients discharged the day of surgery will not be allowed to drive home. IF YOU ARE HAVING SURGERY AND GOING HOME THE SAME DAY, YOU MUST HAVE AN ADULT TO DRIVE YOU HOME AND BE WITH YOU FOR 24 HOURS. YOU MAY GO HOME BY TAXI OR UBER OR ORTHERWISE, BUT AN ADULT MUST ACCOMPANY YOU HOME AND STAY WITH YOU FOR 24 HOURS.  Name and phone number of your driver:  Special Instructions: N/A              Please read over the following fact sheets you were given: _____________________________________________________________________             [facility] - Preparing for Surgery Before surgery, you can play an important role.  Because skin is not sterile, your skin needs to be as free of germs as possible.  You can reduce the number of germs on your skin by washing with CHG (chlorahexidine gluconate) soap before surgery.  CHG is an antiseptic cleaner which kills germs and bonds with the skin to continue killing germs even after washing. Please DO NOT use if you have an allergy to CHG or antibacterial soaps.  If your skin becomes reddened/irritated stop using the CHG and inform your nurse when you arrive at Short Stay. Do not shave (including legs and underarms) for at least 48 hours prior to the first CHG shower.  You may shave your face/neck. Please follow these instructions carefully:  1.  Shower with CHG Soap the night before surgery and the  morning of Surgery.  2.  If you choose to wash your hair, wash your hair first as  usual with your  normal  shampoo.  3.  After you shampoo, rinse your hair and body thoroughly to remove the  shampoo.                           4.  Use CHG as you would any other liquid soap.  You can apply chg directly  to the skin and wash                       Gently with a scrungie or clean washcloth.  5.  Apply the CHG Soap to your body ONLY FROM THE NECK DOWN.   Do not use on face/ open                           Wound or open sores. Avoid contact with eyes, ears mouth and genitals (private parts).                       Wash face,  Genitals (private parts) with your normal soap.  6.  Wash thoroughly, paying special attention to the area where your surgery  will be performed.  7.  Thoroughly rinse your body with warm water from the neck down.  8.  DO NOT shower/wash with your normal soap after using and rinsing off  the CHG Soap.                9.  Pat yourself dry with a clean towel.            10.  Wear clean pajamas.            11.  Place clean sheets on your bed the night of your first shower and do not  sleep with pets. Day of Surgery : Do not apply any lotions/deodorants the morning of surgery.  Please wear clean clothes to the hospital/surgery center.  FAILURE TO FOLLOW THESE INSTRUCTIONS MAY RESULT IN THE CANCELLATION OF YOUR SURGERY PATIENT SIGNATURE_________________________________  NURSE SIGNATURE__________________________________  ________________________________________________________________________   Adam Phenix  An incentive spirometer is a tool that can help keep your lungs clear and active. This tool measures how well you are filling your lungs with each breath. Taking long deep breaths may help reverse or decrease the chance of developing breathing (pulmonary) problems (especially infection) following:  A long period of time when you are unable to move or be active. BEFORE THE PROCEDURE   If the spirometer includes an indicator to show your  best effort, your nurse or respiratory therapist will set it to a desired goal.  If possible, sit up straight or lean slightly forward. Try not to slouch.  Hold the incentive spirometer in an upright position. INSTRUCTIONS FOR USE  1. Sit on the edge of your bed if possible, or sit up as far as you can in bed or on a chair. 2. Hold the incentive spirometer in an upright position. 3. Breathe out normally. 4. Place the mouthpiece in your mouth and seal your lips tightly around it. 5. Breathe in slowly and as deeply as possible, raising the piston or the ball toward the top of the column. 6. Hold your breath for 3-5 seconds or for as long as possible. Allow the piston or ball to fall to the bottom of the column. 7. Remove the mouthpiece from your mouth and breathe out normally. 8. Rest for a few seconds and repeat Steps 1 through 7 at least 10 times every 1-2 hours when you are awake. Take your time and take a few normal breaths between deep breaths. 9. The spirometer may include an indicator to show your best effort. Use the indicator as a goal to work toward during each repetition. 10. After each set of 10 deep breaths, practice coughing to be sure your lungs are clear. If you have an incision (the cut made at the time of surgery), support your incision when coughing by placing a pillow or rolled up towels firmly against it. Once you are able to get out of bed, walk around indoors and cough well. You may stop using the incentive spirometer when instructed by your caregiver.  RISKS AND COMPLICATIONS  Take your time so you do not get dizzy or light-headed.  If you are in pain, you may need to take or ask for pain medication before doing incentive spirometry. It is harder to take a deep breath if you are having pain. AFTER USE  Rest and breathe slowly and easily.  It can be helpful to keep track of a log of your  progress. Your caregiver can provide you with a simple table to help with this. If  you are using the spirometer at home, follow these instructions: [place] IF:   You are having difficultly using the spirometer.  You have trouble using the spirometer as often as instructed.  Your pain medication is not giving enough relief while using the spirometer.  You develop fever of 100.5 F (38.1 C) or higher. SEEK IMMEDIATE MEDICAL CARE IF:   You cough up bloody sputum that had not been present before.  You develop fever of 102 F (38.9 C) or greater.  You develop worsening pain at or near the incision site. MAKE SURE YOU:   Understand these instructions.  Will watch your condition.  Will get help right away if you are not doing well or get worse. Document Released: 09/03/2006 Document Revised: 07/16/2011 Document Reviewed: 11/04/2006 [facility] Patient Information 2014 [place], [place].   ________________________________________________________________________

## 2020-05-18 ENCOUNTER — Inpatient Hospital Stay (HOSPITAL_COMMUNITY)
Admission: RE | Admit: 2020-05-18 | Discharge: 2020-05-18 | Disposition: A | Discharge status: Home / Self Care | Payer: PPO | Source: Ambulatory Visit

## 2020-05-20 ENCOUNTER — Ambulatory Visit
Admission: EM | Admit: 2020-05-20 | Discharge: 2020-05-20 | Disposition: A | Discharge status: Home / Self Care | Payer: PPO | Attending: Emergency Medicine | Admitting: Emergency Medicine

## 2020-05-20 ENCOUNTER — Other Ambulatory Visit: Payer: Self-pay

## 2020-05-20 ENCOUNTER — Telehealth: Payer: Self-pay | Admitting: Orthopedic Surgery

## 2020-05-20 ENCOUNTER — Encounter: Payer: Self-pay | Admitting: Emergency Medicine

## 2020-05-20 DIAGNOSIS — M7751 Other enthesopathy of right foot: Secondary | ICD-10-CM

## 2020-05-20 MED ORDER — PREDNISONE 10 MG (21) PO TBPK
ORAL_TABLET | Freq: Every day | ORAL | 0 refills | Status: DC
Start: 2020-05-20 — End: 2020-06-17

## 2020-05-20 NOTE — Telephone Encounter (Signed)
Patient called to relay she has a new problem, right foot pain; said she is concerned that she may have a "stress fracture" of her foot. Offered the first available, which is Tuesday, 05/24/20 (we have no providers on Monday, and our morning schedule through 11:50 is double/triple booked. Patient said she will try Urgent Care in [place] where she can be Angel Rivera; will call back if needs the appointment.* *Note, as per chart: patient had been referred by Dr Aline Brochure to our [facility] - patient relays her surgery with Dr Ninfa Linden is cancelled due to Covid; reschedule pending.

## 2020-05-20 NOTE — ED Triage Notes (Signed)
During the night patient was awoken by a sharp pain shooting from the toes to back of foot on the right outer side

## 2020-05-20 NOTE — ED Provider Notes (Signed)
[place]   [id number] 05/20/20 Arrival Time: 6237   Chief Complaint  Patient presents with  . Foot Pain     SUBJECTIVE: History from: patient.  Angel Rivera is a 70 y.o. female presented to the urgent care with a complaint of right foot pain that started last night.  He localizes the pain to the right foot.  Described the pain as constant achy.  Has tried OTC medications without relief.  Her symptoms are made worse with ROM.  She denies similar symptoms in the past.  Denies chills, fever, nausea, vomiting, diarrhea  ROS: As per HPI.  All other pertinent ROS negative.      Past Medical History:  Diagnosis Date  . Coronary artery disease    NATIVE VESSEL (ICD-414.01)-cath 08/17/09 with subtotally occluded RCA, moderate disease LAD.  Marland Kitchen Hyperlipidemia   . Hypotension   . Myocardial infarction ([place]) 08/17/2009  . Tobacco user    Past Surgical History:  Procedure Laterality Date  . ABDOMINAL HYSTERECTOMY  1980s   laparoscopic  . CARDIAC CATHETERIZATION N/A 12/30/2015   Procedure: Left Heart Cath and Coronary Angiography;  Surgeon: Burnell Blanks, MD;  Location: [place];  Service: Cardiovascular;  Laterality: N/A;  . CARDIAC CATHETERIZATION N/A 12/30/2015   Procedure: Coronary Stent Intervention;  Surgeon: Burnell Blanks, MD;  Location: [facility];  Service: Cardiovascular;  Laterality: N/A;  . CORONARY ANGIOPLASTY WITH STENT PLACEMENT  08/17/2009   "1 stent"  . TUBAL LIGATION  ~ 1980   Allergies  Allergen Reactions  . Atorvastatin Other (See Comments)    Leg pain   No current facility-administered medications on file prior to encounter.   Current Outpatient Medications on File Prior to Encounter  Medication Sig Dispense Refill  . ascorbic acid (VITAMIN C) 500 MG tablet Take 500 mg by mouth daily.    Marland Kitchen aspirin EC 81 MG tablet Take 1 tablet (81 mg total) by mouth daily. 90 tablet 3  . B Complex-C (B-COMPLEX WITH VITAMIN C)  tablet Take 1 tablet by mouth daily.    . Biotin 5000 MCG TABS Take 5,000 mcg by mouth daily.    . cholecalciferol (VITAMIN D) 25 MCG (1000 UNIT) tablet Take 1,000 Units by mouth daily.    . furosemide (LASIX) 40 MG tablet Take 1 tablet (40 mg total) by mouth daily as needed for edema. 90 tablet 3  . LORazepam (ATIVAN) 0.5 MG tablet Take 0.5 mg by mouth at bedtime as needed for sleep.    Marland Kitchen lovastatin (MEVACOR) 40 MG tablet Take 1 tablet (40 mg total) by mouth at bedtime. Please keep upcoming appt in January 2022 with Dr. Angelena Form before anymore refills. Thank you 90 tablet 0  . metoprolol succinate (TOPROL-XL) 25 MG 24 hr tablet Take 0.5 tablets (12.5 mg total) by mouth daily. Please keep upcoming appt in January 2022 with Dr. Angelena Form before anymore refills. Thank you 45 tablet 0  . milk thistle 175 MG tablet Take 175 mg by mouth daily.    . traMADol (ULTRAM) 50 MG tablet Take 2 tablets (100 mg total) by mouth every 6 (six) hours as needed (pain.). 30 tablet 0   Social History   Socioeconomic History  . Marital status: Divorced    Spouse name: Not on file  . Number of children: Not on file  . Years of education: Not on file  . Highest education level: Not on file  Occupational History  . Not on file  Tobacco Use  . Smoking status: Former Smoker    Packs/day: 0.50    Years: 33.00    Pack years: 16.50    Types: Cigarettes    Quit date: 05/07/2013    Years since quitting: 7.0  . Smokeless tobacco: Never Used  Vaping Use  . Vaping Use: Never used  Substance and Sexual Activity  . Alcohol use: Yes    Comment: 12/30/2015 "might have a drink once/year"  . Drug use: No  . Sexual activity: Not on file  Other Topics Concern  . Not on file  Social History Narrative   The patient has a almost 1-pack per day tobacco use     history.     She occasionally has alcohol-no abuse.     No illicit drug use   Divorced, 2 children   Social Determinants of Radio broadcast assistant Strain: Not  on file  Food Insecurity: Not on file  Transportation Needs: Not on file  Physical Activity: Not on file  Stress: Not on file  Social Connections: Not on file  Intimate Partner Violence: Not on file   Family History  Problem Relation Age of Onset  . Heart attack Mother   . Coronary artery disease Other   . Heart disease Father        bypass  . Heart disease Brother        bypass    OBJECTIVE:  Vitals:   05/20/20 1545 05/20/20 1554  BP: 126/83   Pulse: 78   Resp: 15   Temp: 98.1 F (36.7 C)   SpO2: 98%   Weight:  145 lb (65.8 kg)  Height:  5\' 3"  (1.6 m)     Physical Exam Vitals and nursing note reviewed.  Constitutional:      General: She is not in acute distress.    Appearance: Normal appearance. She is normal weight. She is not ill-appearing, toxic-appearing or diaphoretic.  HENT:     Head: Normocephalic.  Cardiovascular:     Rate and Rhythm: Normal rate and regular rhythm.     Pulses: Normal pulses.     Heart sounds: Normal heart sounds. No murmur heard. No friction rub. No gallop.   Pulmonary:     Effort: Pulmonary effort is normal. No respiratory distress.     Breath sounds: Normal breath sounds. No stridor. No wheezing, rhonchi or rales.  Chest:     Chest wall: No tenderness.  Musculoskeletal:        General: Tenderness present.     Right foot: Tenderness present.     Comments: The right foot is without any obvious asymmetry or deformity when compared to the left foot.  There is no ecchymosis, open wound, lesion, warmth, surface trauma, or swelling present.  Limited range of motion due to pain.  Neurovascular status intact.  Neurological:     Mental Status: She is alert and oriented to person, place, and time.      LABS:  No results found for this or any previous visit (from the past 24 hour(s)).   ASSESSMENT & PLAN:  1. Tendinitis of right foot     Meds ordered this encounter  Medications  . predniSONE (STERAPRED UNI-PAK 21 TAB) 10 MG (21)  TBPK tablet    Sig: Take by mouth daily. Take 6 tabs by mouth daily  for 1 days, then 5 tabs for 1 days, then 4 tabs for 1 days, then 3 tabs for 1 days, 2 tabs for 1 days,  then 1 tab by mouth daily for 1 days    Dispense:  21 tablet    Refill:  0   Discharge instructions    Prednisone was prescribed/take as directed Continue to take OTC Tylenol as needed for pain Follow RICE instruction that is attached Follow-up with PCP Return or go to ED if you develop any new or worsening of your symptoms  Reviewed expectations re: course of current medical issues. Questions answered. Outlined signs and symptoms indicating need for more acute intervention. Patient verbalized understanding. After Visit Summary given.         Emerson Monte, [place] 05/20/20 1651

## 2020-05-20 NOTE — Discharge Instructions (Addendum)
Prednisone was prescribed/take as directed Continue to take OTC Tylenol as needed for pain Follow RICE instruction that is attached Follow-up with PCP Return or go to ED if you develop any new or worsening of your symptoms

## 2020-05-26 ENCOUNTER — Other Ambulatory Visit: Payer: Self-pay

## 2020-05-30 ENCOUNTER — Other Ambulatory Visit: Payer: Self-pay | Admitting: Physician Assistant

## 2020-06-01 NOTE — Patient Instructions (Signed)
DUE TO COVID-19 ONLY ONE VISITOR IS ALLOWED TO COME WITH YOU AND STAY IN THE WAITING ROOM ONLY DURING PRE OP AND PROCEDURE DAY OF SURGERY. THE 1 VISITOR  MAY VISIT WITH YOU AFTER SURGERY IN YOUR PRIVATE ROOM DURING VISITING HOURS ONLY!  YOU NEED TO HAVE A COVID 19 TEST ON: 06/07/20 @           , THIS TEST MUST BE DONE BEFORE SURGERY,  COVID TESTING SITE [address] [place] [address], IT IS ON THE RIGHT GOING OUT [address] APPROXIMATELY  2 MINUTES PAST [employer] ON THE RIGHT. ONCE YOUR COVID TEST IS COMPLETED,  PLEASE BEGIN THE QUARANTINE INSTRUCTIONS AS OUTLINED IN YOUR HANDOUT.                Angel Rivera    Your procedure is scheduled on: 06/10/20   Report to [facility] Main  Entrance   Report to admitting at: 8:30 AM     Call this number if you have problems the morning of surgery [phone]    Remember:   NO SOLID FOOD AFTER MIDNIGHT THE NIGHT PRIOR TO SURGERY. NOTHING BY MOUTH EXCEPT CLEAR LIQUIDS UNTIL: 8:00 AM . PLEASE FINISH ENSURE DRINK PER SURGEON ORDER  WHICH NEEDS TO BE COMPLETED AT: 8:00 AM .  CLEAR LIQUID DIET  Foods Allowed                                                                     Foods Excluded  Coffee and tea, regular and decaf                             liquids that you cannot  Plain Jell-O any favor except red or purple                                           see through such as: Fruit ices (not with fruit pulp)                                     milk, soups, orange juice  Iced Popsicles                                    All solid food Carbonated beverages, regular and diet                                    Cranberry, grape and apple juices Sports drinks like Gatorade Lightly seasoned clear broth or consume(fat free) Sugar, honey syrup  Sample Menu Breakfast                                Lunch  Supper Cranberry juice                    Beef broth                             Chicken broth Jell-O                                     Grape juice                           Apple juice Coffee or tea                        Jell-O                                      Popsicle                                                Coffee or tea                        Coffee or tea  _____________________________________________________________________   BRUSH YOUR TEETH MORNING OF SURGERY AND RINSE YOUR MOUTH OUT, NO CHEWING GUM CANDY OR MINTS.    Take these medicines the morning of surgery with A SIP OF WATER: metoprolol,prednisone.                               You may not have any metal on your body including hair pins and              piercings  Do not wear jewelry, make-up, lotions, powders or perfumes, deodorant             Do not wear nail polish on your fingernails.  Do not shave  48 hours prior to surgery.    Do not bring valuables to the hospital. [place].  Contacts, dentures or bridgework may not be worn into surgery.  Leave suitcase in the car. After surgery it may be brought to your room.     Patients discharged the day of surgery will not be allowed to drive home. IF YOU ARE HAVING SURGERY AND GOING HOME THE SAME DAY, YOU MUST HAVE AN ADULT TO DRIVE YOU HOME AND BE WITH YOU FOR 24 HOURS. YOU MAY GO HOME BY TAXI OR UBER OR ORTHERWISE, BUT AN ADULT MUST ACCOMPANY YOU HOME AND STAY WITH YOU FOR 24 HOURS.  Name and phone number of your driver:  Special Instructions: N/A              Please read over the following fact sheets you were given: _____________________________________________________________________         [facility] - Preparing for Surgery Before surgery, you can play an important role.  Because skin is not sterile, your skin needs to be as free of germs as possible.  You can  reduce the number of germs on your skin by washing with CHG (chlorahexidine gluconate) soap before surgery.  CHG is an  antiseptic cleaner which kills germs and bonds with the skin to continue killing germs even after washing. Please DO NOT use if you have an allergy to CHG or antibacterial soaps.  If your skin becomes reddened/irritated stop using the CHG and inform your nurse when you arrive at Short Stay. Do not shave (including legs and underarms) for at least 48 hours prior to the first CHG shower.  You may shave your face/neck. Please follow these instructions carefully:  1.  Shower with CHG Soap the night before surgery and the  morning of Surgery.  2.  If you choose to wash your hair, wash your hair first as usual with your  normal  shampoo.  3.  After you shampoo, rinse your hair and body thoroughly to remove the  shampoo.                           4.  Use CHG as you would any other liquid soap.  You can apply chg directly  to the skin and wash                       Gently with a scrungie or clean washcloth.  5.  Apply the CHG Soap to your body ONLY FROM THE NECK DOWN.   Do not use on face/ open                           Wound or open sores. Avoid contact with eyes, ears mouth and genitals (private parts).                       Wash face,  Genitals (private parts) with your normal soap.             6.  Wash thoroughly, paying special attention to the area where your surgery  will be performed.  7.  Thoroughly rinse your body with warm water from the neck down.  8.  DO NOT shower/wash with your normal soap after using and rinsing off  the CHG Soap.                9.  Pat yourself dry with a clean towel.            10.  Wear clean pajamas.            11.  Place clean sheets on your bed the night of your first shower and do not  sleep with pets. Day of Surgery : Do not apply any lotions/deodorants the morning of surgery.  Please wear clean clothes to the hospital/surgery center.  FAILURE TO FOLLOW THESE INSTRUCTIONS MAY RESULT IN THE CANCELLATION OF YOUR SURGERY PATIENT  SIGNATURE_________________________________  NURSE SIGNATURE__________________________________  ________________________________________________________________________   Angel Rivera  An incentive spirometer is a tool that can help keep your lungs clear and active. This tool measures how well you are filling your lungs with each breath. Taking long deep breaths may help reverse or decrease the chance of developing breathing (pulmonary) problems (especially infection) following:  A long period of time when you are unable to move or be active. BEFORE THE PROCEDURE   If the spirometer includes an indicator to show your best effort, your nurse or respiratory therapist will set it  to a desired goal.  If possible, sit up straight or lean slightly forward. Try not to slouch.  Hold the incentive spirometer in an upright position. INSTRUCTIONS FOR USE  1. Sit on the edge of your bed if possible, or sit up as far as you can in bed or on a chair. 2. Hold the incentive spirometer in an upright position. 3. Breathe out normally. 4. Place the mouthpiece in your mouth and seal your lips tightly around it. 5. Breathe in slowly and as deeply as possible, raising the piston or the ball toward the top of the column. 6. Hold your breath for 3-5 seconds or for as long as possible. Allow the piston or ball to fall to the bottom of the column. 7. Remove the mouthpiece from your mouth and breathe out normally. 8. Rest for a few seconds and repeat Steps 1 through 7 at least 10 times every 1-2 hours when you are awake. Take your time and take a few normal breaths between deep breaths. 9. The spirometer may include an indicator to show your best effort. Use the indicator as a goal to work toward during each repetition. 10. After each set of 10 deep breaths, practice coughing to be sure your lungs are clear. If you have an incision (the cut made at the time of surgery), support your incision when coughing  by placing a pillow or rolled up towels firmly against it. Once you are able to get out of bed, walk around indoors and cough well. You may stop using the incentive spirometer when instructed by your caregiver.  RISKS AND COMPLICATIONS  Take your time so you do not get dizzy or light-headed.  If you are in pain, you may need to take or ask for pain medication before doing incentive spirometry. It is harder to take a deep breath if you are having pain. AFTER USE  Rest and breathe slowly and easily.  It can be helpful to keep track of a log of your progress. Your caregiver can provide you with a simple table to help with this. If you are using the spirometer at home, follow these instructions: [place] IF:   You are having difficultly using the spirometer.  You have trouble using the spirometer as often as instructed.  Your pain medication is not giving enough relief while using the spirometer.  You develop fever of 100.5 F (38.1 C) or higher. SEEK IMMEDIATE MEDICAL CARE IF:   You cough up bloody sputum that had not been present before.  You develop fever of 102 F (38.9 C) or greater.  You develop worsening pain at or near the incision site. MAKE SURE YOU:   Understand these instructions.  Will watch your condition.  Will get help right away if you are not doing well or get worse. Document Released: 09/03/2006 Document Revised: 07/16/2011 Document Reviewed: 11/04/2006 [facility] Patient Information 2014 [place], [place].   ________________________________________________________________________

## 2020-06-02 ENCOUNTER — Encounter (HOSPITAL_COMMUNITY)
Admission: RE | Admit: 2020-06-02 | Discharge: 2020-06-02 | Disposition: A | Discharge status: Home / Self Care | Payer: PPO | Source: Ambulatory Visit | Attending: Anesthesiology | Admitting: Anesthesiology

## 2020-06-02 ENCOUNTER — Other Ambulatory Visit: Payer: Self-pay | Admitting: Orthopaedic Surgery

## 2020-06-03 NOTE — Progress Notes (Signed)
Anesthesia Chart Review   Case: [id number] Date/Time: 06/17/20 0700   Procedure: RIGHT TOTAL HIP ARTHROPLASTY ANTERIOR APPROACH (Right Hip)   Anesthesia type: Spinal   Pre-op diagnosis: osteoarthritis right hip   Location: [facility] ROOM 09 / [facility]   Surgeons: Mcarthur Rossetti, MD      DISCUSSION:70 y.o. former smoker with h/o CAD (DES 2011, DES 2017), right hip OA scheduled for above procedure 06/17/20 with Dr. Jean Rosenthal   Pt last seen by cardiology 05/09/20. Per OV note, "Pre-operative cardiovascular examination: She has no angina, signs of heart failure or arrhythmias. She can proceed with her planned hip replacement surgery."  Anticipate pt can proceed with planned procedure barring acute status change.   VS: There were no vitals taken for this visit.  PROVIDERS: Lemmie Evens, MD is PCP    LABS: Labs DOS (all labs ordered are listed, but only abnormal results are displayed)  Labs Reviewed - No data to display   IMAGES:   EKG:   CV: Echo 08/18/09 Study Conclusions   - Left ventricle: The cavity size was normal. Wall thickness was   normal. Systolic function was mildly reduced. The estimated   ejection fraction was in the range of 45% to 50%. Possible   hypokinesis of the inferoposterior myocardium.  - Mitral valve: Mild regurgitation.  - Left atrium: The atrium was mildly dilated.  Past Medical History:  Diagnosis Date  . Coronary artery disease    NATIVE VESSEL (ICD-414.01)-cath 08/17/09 with subtotally occluded RCA, moderate disease LAD.  Marland Kitchen Hyperlipidemia   . Hypotension   . Myocardial infarction ([place]) 08/17/2009  . Tobacco user     Past Surgical History:  Procedure Laterality Date  . ABDOMINAL HYSTERECTOMY  1980s   laparoscopic  . CARDIAC CATHETERIZATION N/A 12/30/2015   Procedure: Left Heart Cath and Coronary Angiography;  Surgeon: Burnell Blanks, MD;  Location: [facility];  Service: Cardiovascular;  Laterality: N/A;   . CARDIAC CATHETERIZATION N/A 12/30/2015   Procedure: Coronary Stent Intervention;  Surgeon: Burnell Blanks, MD;  Location: [place];  Service: Cardiovascular;  Laterality: N/A;  . CORONARY ANGIOPLASTY WITH STENT PLACEMENT  08/17/2009   "1 stent"  . TUBAL LIGATION  ~ 1980    MEDICATIONS: . ascorbic acid (VITAMIN C) 500 MG tablet  . aspirin EC 81 MG tablet  . B Complex-C (B-COMPLEX WITH VITAMIN C) tablet  . Biotin 5000 MCG TABS  . cholecalciferol (VITAMIN D) 25 MCG (1000 UNIT) tablet  . furosemide (LASIX) 40 MG tablet  . LORazepam (ATIVAN) 0.5 MG tablet  . lovastatin (MEVACOR) 40 MG tablet  . metoprolol succinate (TOPROL-XL) 25 MG 24 hr tablet  . milk thistle 175 MG tablet  . predniSONE (STERAPRED UNI-PAK 21 TAB) 10 MG (21) TBPK tablet  . traMADol (ULTRAM) 50 MG tablet   No current facility-administered medications for this encounter.    Konrad Felix, PA-C [facility] Pre-Surgical Testing [phone]

## 2020-06-06 ENCOUNTER — Inpatient Hospital Stay: Payer: PPO | Admitting: Orthopaedic Surgery

## 2020-06-08 ENCOUNTER — Other Ambulatory Visit: Payer: Self-pay

## 2020-06-09 ENCOUNTER — Other Ambulatory Visit (HOSPITAL_COMMUNITY): Payer: PPO

## 2020-06-13 NOTE — Patient Instructions (Signed)
DUE TO COVID-19 ONLY ONE VISITOR IS ALLOWED TO COME WITH YOU AND STAY IN THE WAITING ROOM ONLY DURING PRE OP AND PROCEDURE DAY OF SURGERY. THE 1 VISITOR  MAY VISIT WITH YOU AFTER SURGERY IN YOUR PRIVATE ROOM DURING VISITING HOURS ONLY!  YOU NEED TO HAVE A COVID 19 TEST ON: 06/14/20 @ 2:20 PM, THIS TEST MUST BE DONE BEFORE SURGERY,  COVID TESTING SITE [place]  [id number], IT IS ON THE RIGHT GOING OUT [address] APPROXIMATELY  2 MINUTES PAST [employer] ON THE RIGHT. ONCE YOUR COVID TEST IS COMPLETED,  PLEASE BEGIN THE QUARANTINE INSTRUCTIONS AS OUTLINED IN YOUR HANDOUT.                Angel Rivera    Your procedure is scheduled on: 06/17/20   Report to [facility] Main  Entrance   Report to short stay at: 5:30 AM     Call this number if you have problems the morning of surgery [phone]    Remember:   NO SOLID FOOD AFTER MIDNIGHT THE NIGHT PRIOR TO SURGERY. NOTHING BY MOUTH EXCEPT CLEAR LIQUIDS UNTIL: 4:15 AM . PLEASE FINISH ENSURE DRINK PER SURGEON ORDER  WHICH NEEDS TO BE COMPLETED AT: 4:15 AM .  CLEAR LIQUID DIET  Foods Allowed                                                                     Foods Excluded  Coffee and tea, regular and decaf                             liquids that you cannot  Plain Jell-O any favor except red or purple                                           see through such as: Fruit ices (not with fruit pulp)                                     milk, soups, orange juice  Iced Popsicles                                    All solid food Carbonated beverages, regular and diet                                    Cranberry, grape and apple juices Sports drinks like Gatorade Lightly seasoned clear broth or consume(fat free) Sugar, honey syrup  Sample Menu Breakfast                                Lunch  Supper Cranberry juice                    Beef broth                             Chicken broth Jell-O                                     Grape juice                           Apple juice Coffee or tea                        Jell-O                                      Popsicle                                                Coffee or tea                        Coffee or tea  _____________________________________________________________________   BRUSH YOUR TEETH MORNING OF SURGERY AND RINSE YOUR MOUTH OUT, NO CHEWING GUM CANDY OR MINTS.    Take these medicines the morning of surgery with A SIP OF WATER: metoprolol.  DO NOT TAKE ANY DIABETIC MEDICATIONS DAY OF YOUR SURGERY                               You may not have any metal on your body including hair pins and              piercings  Do not wear jewelry, make-up, lotions, powders or perfumes, deodorant             Do not wear nail polish on your fingernails.  Do not shave  48 hours prior to surgery.    Do not bring valuables to the hospital. [place].  Contacts, dentures or bridgework may not be worn into surgery.  Leave suitcase in the car. After surgery it may be brought to your room.     Patients discharged the day of surgery will not be allowed to drive home. IF YOU ARE HAVING SURGERY AND GOING HOME THE SAME DAY, YOU MUST HAVE AN ADULT TO DRIVE YOU HOME AND BE WITH YOU FOR 24 HOURS. YOU MAY GO HOME BY TAXI OR UBER OR ORTHERWISE, BUT AN ADULT MUST ACCOMPANY YOU HOME AND STAY WITH YOU FOR 24 HOURS.  Name and phone number of your driver:  Special Instructions: N/A              Please read over the following fact sheets you were given: _____________________________________________________________________         [facility] - Preparing for Surgery Before surgery, you can play an important role.  Because skin is not sterile, your skin needs  to be as free of germs as possible.  You can reduce the number of germs on your skin by washing with CHG (chlorahexidine  gluconate) soap before surgery.  CHG is an antiseptic cleaner which kills germs and bonds with the skin to continue killing germs even after washing. Please DO NOT use if you have an allergy to CHG or antibacterial soaps.  If your skin becomes reddened/irritated stop using the CHG and inform your nurse when you arrive at Short Stay. Do not shave (including legs and underarms) for at least 48 hours prior to the first CHG shower.  You may shave your face/neck. Please follow these instructions carefully:  1.  Shower with CHG Soap the night before surgery and the  morning of Surgery.  2.  If you choose to wash your hair, wash your hair first as usual with your  normal  shampoo.  3.  After you shampoo, rinse your hair and body thoroughly to remove the  shampoo.                           4.  Use CHG as you would any other liquid soap.  You can apply chg directly  to the skin and wash                       Gently with a scrungie or clean washcloth.  5.  Apply the CHG Soap to your body ONLY FROM THE NECK DOWN.   Do not use on face/ open                           Wound or open sores. Avoid contact with eyes, ears mouth and genitals (private parts).                       Wash face,  Genitals (private parts) with your normal soap.             6.  Wash thoroughly, paying special attention to the area where your surgery  will be performed.  7.  Thoroughly rinse your body with warm water from the neck down.  8.  DO NOT shower/wash with your normal soap after using and rinsing off  the CHG Soap.                9.  Pat yourself dry with a clean towel.            10.  Wear clean pajamas.            11.  Place clean sheets on your bed the night of your first shower and do not  sleep with pets. Day of Surgery : Do not apply any lotions/deodorants the morning of surgery.  Please wear clean clothes to the hospital/surgery center.  FAILURE TO FOLLOW THESE INSTRUCTIONS MAY RESULT IN THE CANCELLATION OF YOUR  SURGERY PATIENT SIGNATURE_________________________________  NURSE SIGNATURE__________________________________  ________________________________________________________________________   Angel Rivera  An incentive spirometer is a tool that can help keep your lungs clear and active. This tool measures how well you are filling your lungs with each breath. Taking long deep breaths may help reverse or decrease the chance of developing breathing (pulmonary) problems (especially infection) following:  A long period of time when you are unable to move or be active. BEFORE THE PROCEDURE   If the spirometer includes an indicator to show  your best effort, your nurse or respiratory therapist will set it to a desired goal.  If possible, sit up straight or lean slightly forward. Try not to slouch.  Hold the incentive spirometer in an upright position. INSTRUCTIONS FOR USE  1. Sit on the edge of your bed if possible, or sit up as far as you can in bed or on a chair. 2. Hold the incentive spirometer in an upright position. 3. Breathe out normally. 4. Place the mouthpiece in your mouth and seal your lips tightly around it. 5. Breathe in slowly and as deeply as possible, raising the piston or the ball toward the top of the column. 6. Hold your breath for 3-5 seconds or for as long as possible. Allow the piston or ball to fall to the bottom of the column. 7. Remove the mouthpiece from your mouth and breathe out normally. 8. Rest for a few seconds and repeat Steps 1 through 7 at least 10 times every 1-2 hours when you are awake. Take your time and take a few normal breaths between deep breaths. 9. The spirometer may include an indicator to show your best effort. Use the indicator as a goal to work toward during each repetition. 10. After each set of 10 deep breaths, practice coughing to be sure your lungs are clear. If you have an incision (the cut made at the time of surgery), support your  incision when coughing by placing a pillow or rolled up towels firmly against it. Once you are able to get out of bed, walk around indoors and cough well. You may stop using the incentive spirometer when instructed by your caregiver.  RISKS AND COMPLICATIONS  Take your time so you do not get dizzy or light-headed.  If you are in pain, you may need to take or ask for pain medication before doing incentive spirometry. It is harder to take a deep breath if you are having pain. AFTER USE  Rest and breathe slowly and easily.  It can be helpful to keep track of a log of your progress. Your caregiver can provide you with a simple table to help with this. If you are using the spirometer at home, follow these instructions: [place] IF:   You are having difficultly using the spirometer.  You have trouble using the spirometer as often as instructed.  Your pain medication is not giving enough relief while using the spirometer.  You develop fever of 100.5 F (38.1 C) or higher. SEEK IMMEDIATE MEDICAL CARE IF:   You cough up bloody sputum that had not been present before.  You develop fever of 102 F (38.9 C) or greater.  You develop worsening pain at or near the incision site. MAKE SURE YOU:   Understand these instructions.  Will watch your condition.  Will get help right away if you are not doing well or get worse. Document Released: 09/03/2006 Document Revised: 07/16/2011 Document Reviewed: 11/04/2006 [facility] Patient Information 2014 [place], [place].   ________________________________________________________________________

## 2020-06-14 ENCOUNTER — Encounter (HOSPITAL_COMMUNITY): Payer: Self-pay

## 2020-06-14 ENCOUNTER — Encounter (HOSPITAL_COMMUNITY)
Admission: RE | Admit: 2020-06-14 | Discharge: 2020-06-14 | Disposition: A | Discharge status: Home / Self Care | Payer: PPO | Source: Ambulatory Visit | Attending: Orthopaedic Surgery | Admitting: Orthopaedic Surgery

## 2020-06-14 ENCOUNTER — Other Ambulatory Visit: Payer: Self-pay

## 2020-06-14 ENCOUNTER — Other Ambulatory Visit (HOSPITAL_COMMUNITY)
Admission: RE | Admit: 2020-06-14 | Discharge: 2020-06-14 | Disposition: A | Discharge status: Home / Self Care | Payer: PPO | Source: Ambulatory Visit | Attending: Orthopaedic Surgery | Admitting: Orthopaedic Surgery

## 2020-06-14 DIAGNOSIS — Z01812 Encounter for preprocedural laboratory examination: Secondary | ICD-10-CM | POA: Insufficient documentation

## 2020-06-14 DIAGNOSIS — Z20822 Contact with and (suspected) exposure to covid-19: Secondary | ICD-10-CM | POA: Diagnosis not present

## 2020-06-14 HISTORY — DX: Unspecified osteoarthritis, unspecified site: M19.90

## 2020-06-14 LAB — CBC
HCT: 38.8 % (ref 36.0–46.0)
Hemoglobin: 12.8 g/dL (ref 12.0–15.0)
MCH: 32.1 pg (ref 26.0–34.0)
MCHC: 33 g/dL (ref 30.0–36.0)
MCV: 97.2 fL (ref 80.0–100.0)
Platelets: 325 10*3/uL (ref 150–400)
RBC: 3.99 MIL/uL (ref 3.87–5.11)
RDW: 12.2 % (ref 11.5–15.5)
WBC: 9.5 10*3/uL (ref 4.0–10.5)
nRBC: 0 % (ref 0.0–0.2)

## 2020-06-14 LAB — BASIC METABOLIC PANEL
Anion gap: 10 (ref 5–15)
BUN: 11 mg/dL (ref 8–23)
CO2: 25 mmol/L (ref 22–32)
Calcium: 9.4 mg/dL (ref 8.9–10.3)
Chloride: 107 mmol/L (ref 98–111)
Creatinine, Ser: 0.92 mg/dL (ref 0.44–1.00)
GFR, Estimated: >60 mL/min (ref >60)
Glucose, Bld: 106 mg/dL — ABNORMAL HIGH (ref 70–99)
Potassium: 3.7 mmol/L (ref 3.5–5.1)
Sodium: 142 mmol/L (ref 135–145)

## 2020-06-14 LAB — SURGICAL PCR SCREEN
MRSA, PCR: NEGATIVE
Staphylococcus aureus: NEGATIVE

## 2020-06-14 LAB — SARS CORONAVIRUS 2 (TAT 6-24 HRS): SARS Coronavirus 2: NEGATIVE

## 2020-06-14 NOTE — Progress Notes (Signed)
COVID Vaccine Completed: Yes Date COVID Vaccine completed: 05/2019 COVID vaccine manufacturer: Moderna     PCP - Dr. Lemmie Evens Cardiologist - Dr. Lauree Chandler. LOV: 05/09/20.: Clearance. : Epic.  Chest x-ray -  EKG - 05/09/20 Stress Test -  ECHO -  Cardiac Cath - 2017 Pacemaker/ICD device last checked:  Sleep Study -  CPAP -   Fasting Blood Sugar -  Checks Blood Sugar _____ times a day  Blood Thinner Instructions: Aspirin Instructions: Last Dose:  Anesthesia review: Hx: MI,CAD,Hypotension.  Patient denies shortness of breath, fever, cough and chest pain at PAT appointment   Patient verbalized understanding of instructions that were given to them at the PAT appointment. Patient was also instructed that they will need to review over the PAT instructions again at home before surgery.

## 2020-06-16 NOTE — Anesthesia Preprocedure Evaluation (Addendum)
Anesthesia Evaluation  Patient identified by MRN, date of birth, ID band Patient awake    Reviewed: Allergy & Precautions, NPO status , Patient's Chart, lab work & pertinent test results, reviewed documented beta blocker date and time   History of Anesthesia Complications Negative for: history of anesthetic complications  Airway Mallampati: II  TM Distance: >3 FB Neck ROM: Full    Dental  (+) Teeth Intact, Dental Advisory Given   Pulmonary former smoker,    Pulmonary exam normal breath sounds clear to auscultation       Cardiovascular hypertension, Pt. on home beta blockers and Pt. on medications (-) angina+ CAD, + Past MI and + Cardiac Stents  Normal cardiovascular exam Rhythm:Regular Rate:Normal     Neuro/Psych negative neurological ROS     GI/Hepatic negative GI ROS, Neg liver ROS,   Endo/Other  negative endocrine ROS  Renal/GU negative Renal ROS     Musculoskeletal  (+) Arthritis  ( osteoarthritis right hip), Osteoarthritis,    Abdominal   Peds  Hematology negative hematology ROS (+) Plt 325k    Anesthesia Other Findings   Reproductive/Obstetrics                            Anesthesia Physical Anesthesia Plan  ASA: III  Anesthesia Plan: Spinal   Post-op Pain Management:    Induction: Intravenous  PONV Risk Score and Plan: 2 and Propofol infusion and Dexamethasone  Airway Management Planned: Natural Airway and Nasal Cannula  Additional Equipment:   Intra-op Plan:   Post-operative Plan:   Informed Consent: I have reviewed the patients History and Physical, chart, labs and discussed the procedure including the risks, benefits and alternatives for the proposed anesthesia with the patient or authorized representative who has indicated his/her understanding and acceptance.     Dental advisory given  Plan Discussed with: CRNA  Anesthesia Plan Comments:         Anesthesia Quick Evaluation

## 2020-06-16 NOTE — H&P (Signed)
TOTAL HIP ADMISSION H&P  Patient is admitted for right total hip arthroplasty.  Subjective:  Chief Complaint: right hip pain  HPI: Angel Rivera, 70 y.o. female, has a history of pain and functional disability in the right hip(s) due to arthritis and patient has failed non-surgical conservative treatments for greater than 12 weeks to include NSAID's and/or analgesics, corticosteriod injections, use of assistive devices and activity modification.  Onset of symptoms was gradual starting 2 years ago with gradually worsening course since that time.The patient noted no past surgery on the right hip(s).  Patient currently rates pain in the right hip at 10 out of 10 with activity. Patient has night pain, worsening of pain with activity and weight bearing, pain that interfers with activities of daily living, pain with passive range of motion and crepitus. Patient has evidence of subchondral cysts, subchondral sclerosis, periarticular osteophytes and joint space narrowing by imaging studies. This condition presents safety issues increasing the risk of falls.  There is no current active infection.  Patient Active Problem List   Diagnosis Date Noted  . Unilateral primary osteoarthritis, 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  . Unilateral primary osteoarthritis, right hip 03/23/2020  . Fracture, [place], left, closed 09/06/17 09/23/2017  . Diarrhea 05/16/2017  . Encounter for HCV screening test for high risk patient 05/16/2017  . Chest pain 06/17/2016  . Unstable angina ([place]) 12/30/2015  . HYPERCHOLESTEROLEMIA 09/09/2009  . MYOCARDIAL INFARCTION, 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  . CAD (coronary artery disease) 09/09/2009  . HYPOTENSION 09/09/2009   Past Medical History:  Diagnosis Date  . Arthritis   . Coronary artery disease    NATIVE VESSEL (ICD-414.01)-cath 08/17/09 with subtotally occluded RCA, moderate disease LAD.  Marland Kitchen Hyperlipidemia   . Hypotension   . Myocardial infarction ([place]) 08/17/2009  . Tobacco user     Past  Surgical History:  Procedure Laterality Date  . ABDOMINAL HYSTERECTOMY  1980s   laparoscopic  . CARDIAC CATHETERIZATION N/A 12/30/2015   Procedure: Left Heart Cath and Coronary Angiography;  Surgeon: Burnell Blanks, MD;  Location: [facility];  Service: Cardiovascular;  Laterality: N/A;  . CARDIAC CATHETERIZATION N/A 12/30/2015   Procedure: Coronary Stent Intervention;  Surgeon: Burnell Blanks, MD;  Location: [facility];  Service: Cardiovascular;  Laterality: N/A;  . CORONARY ANGIOPLASTY WITH STENT PLACEMENT  08/17/2009   "1 stent"  . TUBAL LIGATION  ~ 1980    No current facility-administered medications for this encounter.   Current Outpatient Medications  Medication Sig Dispense Refill Last Dose  . ascorbic acid (VITAMIN C) 500 MG tablet Take 500 mg by mouth daily.     Marland Kitchen aspirin EC 81 MG tablet Take 1 tablet (81 mg total) by mouth daily. 90 tablet 3   . B Complex-C (B-COMPLEX WITH VITAMIN C) tablet Take 1 tablet by mouth daily.     . Biotin 5000 MCG TABS Take 5,000 mcg by mouth daily.     . cholecalciferol (VITAMIN D) 25 MCG (1000 UNIT) tablet Take 1,000 Units by mouth daily.     . furosemide (LASIX) 40 MG tablet Take 1 tablet (40 mg total) by mouth daily as needed for edema. 90 tablet 3   . ibuprofen (ADVIL) 200 MG tablet Take 400-600 mg by mouth daily as needed for headache or moderate pain.     Marland Kitchen LORazepam (ATIVAN) 0.5 MG tablet Take 0.5 mg by mouth at bedtime as needed for sleep.     Marland Kitchen lovastatin (MEVACOR) 40 MG tablet Take 1 tablet (  40 mg total) by mouth at bedtime. Please keep upcoming appt in January 2022 with Dr. Angelena Form before anymore refills. Thank you 90 tablet 0   . metoprolol succinate (TOPROL-XL) 25 MG 24 hr tablet Take 0.5 tablets (12.5 mg total) by mouth daily. Please keep upcoming appt in January 2022 with Dr. Angelena Form before anymore refills. Thank you 45 tablet 0   . milk thistle 175 MG tablet Take 175 mg by mouth daily.     . traMADol  (ULTRAM) 50 MG tablet TAKE 2 TABLETS(100 MG) BY MOUTH EVERY 6 HOURS AS NEEDED FOR PAIN (Patient taking differently: Take 100 mg by mouth every 6 (six) hours as needed for moderate pain.) 30 tablet 0   . predniSONE (STERAPRED UNI-PAK 21 TAB) 10 MG (21) TBPK tablet Take by mouth daily. Take 6 tabs by mouth daily  for 1 days, then 5 tabs for 1 days, then 4 tabs for 1 days, then 3 tabs for 1 days, 2 tabs for 1 days, then 1 tab by mouth daily for 1 days (Patient not taking: Reported on 06/13/2020) 21 tablet 0 Completed Course at Unknown time   Allergies  Allergen Reactions  . Atorvastatin Other (See Comments)    Leg pain    Social History   Tobacco Use  . Smoking status: Former Smoker    Packs/day: 0.50    Years: 33.00    Pack years: 16.50    Types: Cigarettes    Quit date: 05/07/2013    Years since quitting: 7.1  . Smokeless tobacco: Never Used  Substance Use Topics  . Alcohol use: Yes    Comment: 12/30/2015 "might have a drink once/year"    Family History  Problem Relation Age of Onset  . Heart attack Mother   . Coronary artery disease Other   . Heart disease Father        bypass  . Heart disease Brother        bypass     Review of Systems  Objective:  Physical Exam Vitals reviewed.  Constitutional:      Appearance: Normal appearance.  HENT:     Head: Normocephalic and atraumatic.  Eyes:     Extraocular Movements: Extraocular movements intact.     Pupils: Pupils are equal, round, and reactive to light.  Cardiovascular:     Rate and Rhythm: Normal rate.     Pulses: Normal pulses.  Pulmonary:     Effort: Pulmonary effort is normal.  Abdominal:     Palpations: Abdomen is soft.  Musculoskeletal:     Cervical back: Normal range of motion.     Right hip: Tenderness and bony tenderness present. Decreased range of motion. Decreased strength.  Neurological:     Mental Status: She is alert and oriented to person, place, and time.  Psychiatric:        Behavior: Behavior  normal.     Vital signs in last 24 hours:    Labs:   Estimated body mass index is 25.33 kg/m as calculated from the following:   Height as of 06/14/20: 5\' 3"  (1.6 m).   Weight as of 06/14/20: 64.9 kg.   Imaging Review Plain radiographs demonstrate severe degenerative joint disease of the right hip(s). The bone quality appears to be good for age and reported activity level.      Assessment/Plan:  End stage arthritis, right hip(s)  The patient history, physical examination, clinical judgement of the provider and imaging studies are consistent with end stage degenerative joint disease  of the right hip(s) and total hip arthroplasty is deemed medically necessary. The treatment options including medical management, injection therapy, arthroscopy and arthroplasty were discussed at length. The risks and benefits of total hip arthroplasty were presented and reviewed. The risks due to aseptic loosening, infection, stiffness, dislocation/subluxation,  thromboembolic complications and other imponderables were discussed.  The patient acknowledged the explanation, agreed to proceed with the plan and consent was signed. Patient is being admitted for inpatient treatment for surgery, pain control, PT, OT, prophylactic antibiotics, VTE prophylaxis, progressive ambulation and ADL's and discharge planning.The patient is planning to be discharged home with home health services

## 2020-06-17 ENCOUNTER — Ambulatory Visit (HOSPITAL_COMMUNITY): Payer: PPO

## 2020-06-17 ENCOUNTER — Encounter (HOSPITAL_COMMUNITY)
Admission: RE | Disposition: A | Discharge status: Home / Self Care | Payer: Self-pay | Source: Home / Self Care | Attending: Orthopaedic Surgery

## 2020-06-17 ENCOUNTER — Encounter (HOSPITAL_COMMUNITY): Payer: Self-pay | Admitting: Orthopaedic Surgery

## 2020-06-17 ENCOUNTER — Ambulatory Visit (HOSPITAL_COMMUNITY): Payer: PPO | Admitting: Physician Assistant

## 2020-06-17 ENCOUNTER — Observation Stay (HOSPITAL_COMMUNITY)
Admission: RE | Admit: 2020-06-17 | Discharge: 2020-06-18 | Disposition: A | Discharge status: Home / Self Care | Payer: PPO | Attending: Orthopaedic Surgery | Admitting: Orthopaedic Surgery

## 2020-06-17 ENCOUNTER — Other Ambulatory Visit: Payer: Self-pay

## 2020-06-17 ENCOUNTER — Ambulatory Visit (HOSPITAL_COMMUNITY): Payer: PPO | Admitting: Anesthesiology

## 2020-06-17 ENCOUNTER — Observation Stay (HOSPITAL_COMMUNITY): Payer: PPO

## 2020-06-17 DIAGNOSIS — Z87891 Personal history of nicotine dependence: Secondary | ICD-10-CM | POA: Insufficient documentation

## 2020-06-17 DIAGNOSIS — Z79899 Other long term (current) drug therapy: Secondary | ICD-10-CM | POA: Insufficient documentation

## 2020-06-17 DIAGNOSIS — I10 Essential (primary) hypertension: Secondary | ICD-10-CM | POA: Diagnosis not present

## 2020-06-17 DIAGNOSIS — M1611 Unilateral primary osteoarthritis, right hip: Secondary | ICD-10-CM | POA: Diagnosis not present

## 2020-06-17 DIAGNOSIS — I2510 Atherosclerotic heart disease of native coronary artery without angina pectoris: Secondary | ICD-10-CM | POA: Diagnosis not present

## 2020-06-17 DIAGNOSIS — Z7982 Long term (current) use of aspirin: Secondary | ICD-10-CM | POA: Diagnosis not present

## 2020-06-17 DIAGNOSIS — E785 Hyperlipidemia, unspecified: Secondary | ICD-10-CM | POA: Diagnosis not present

## 2020-06-17 DIAGNOSIS — Z419 Encounter for procedure for purposes other than remedying health state, unspecified: Secondary | ICD-10-CM

## 2020-06-17 DIAGNOSIS — I252 Old myocardial infarction: Secondary | ICD-10-CM | POA: Insufficient documentation

## 2020-06-17 DIAGNOSIS — Z471 Aftercare following joint replacement surgery: Secondary | ICD-10-CM | POA: Diagnosis not present

## 2020-06-17 DIAGNOSIS — Z96641 Presence of right artificial hip joint: Secondary | ICD-10-CM

## 2020-06-17 HISTORY — PX: TOTAL HIP ARTHROPLASTY: SHX124

## 2020-06-17 LAB — TYPE AND SCREEN
ABO/RH(D): O POS
Antibody Screen: NEGATIVE

## 2020-06-17 LAB — ABO/RH: ABO/RH(D): O POS

## 2020-06-17 SURGERY — ARTHROPLASTY, HIP, TOTAL, ANTERIOR APPROACH
Anesthesia: Spinal | Site: Hip | Laterality: Right

## 2020-06-17 MED ORDER — MIDAZOLAM HCL 2 MG/2ML IJ SOLN
INTRAMUSCULAR | Status: AC
  Filled 2020-06-17: qty 2

## 2020-06-17 MED ORDER — PHENYLEPHRINE 40 MCG/ML (10ML) SYRINGE FOR IV PUSH (FOR BLOOD PRESSURE SUPPORT)
PREFILLED_SYRINGE | INTRAVENOUS | Status: DC | PRN
  Administered 2020-06-17 (×4): 40 ug via INTRAVENOUS
  Administered 2020-06-17 (×2): 80 ug via INTRAVENOUS

## 2020-06-17 MED ORDER — FENTANYL CITRATE (PF) 100 MCG/2ML IJ SOLN
INTRAMUSCULAR | Status: AC
  Filled 2020-06-17: qty 2

## 2020-06-17 MED ORDER — METHOCARBAMOL 500 MG IVPB - SIMPLE MED
INTRAVENOUS | Status: AC
  Filled 2020-06-17: qty 50

## 2020-06-17 MED ORDER — ASPIRIN 81 MG PO CHEW
81.0000 mg | CHEWABLE_TABLET | Freq: Two times a day (BID) | ORAL | Status: DC
  Administered 2020-06-17 – 2020-06-18 (×2): 81 mg via ORAL
  Filled 2020-06-17 (×2): qty 1

## 2020-06-17 MED ORDER — METHOCARBAMOL 500 MG PO TABS
500.0000 mg | ORAL_TABLET | Freq: Four times a day (QID) | ORAL | Status: DC | PRN
  Administered 2020-06-17 – 2020-06-18 (×2): 500 mg via ORAL
  Filled 2020-06-17 (×2): qty 1

## 2020-06-17 MED ORDER — METOCLOPRAMIDE HCL 5 MG PO TABS: 5.0000 mg | ORAL_TABLET | Freq: Three times a day (TID) | ORAL | Status: DC | PRN

## 2020-06-17 MED ORDER — HYDROCODONE-ACETAMINOPHEN 5-325 MG PO TABS
1.0000 | ORAL_TABLET | ORAL | Status: DC | PRN
  Administered 2020-06-17: 2 via ORAL
  Administered 2020-06-17: 1 via ORAL
  Filled 2020-06-17: qty 2

## 2020-06-17 MED ORDER — ORAL CARE MOUTH RINSE: 15.0000 mL | Freq: Once | OROMUCOSAL | Status: AC

## 2020-06-17 MED ORDER — CEFAZOLIN SODIUM-DEXTROSE 1-4 GM/50ML-% IV SOLN
INTRAVENOUS | Status: AC
  Filled 2020-06-17: qty 50

## 2020-06-17 MED ORDER — ASCORBIC ACID 500 MG PO TABS
500.0000 mg | ORAL_TABLET | Freq: Every day | ORAL | Status: DC
  Administered 2020-06-18: 500 mg via ORAL
  Filled 2020-06-17: qty 1

## 2020-06-17 MED ORDER — FUROSEMIDE 40 MG PO TABS: 40.0000 mg | ORAL_TABLET | Freq: Every day | ORAL | Status: DC | PRN

## 2020-06-17 MED ORDER — CHLORHEXIDINE GLUCONATE 0.12 % MT SOLN
15.0000 mL | Freq: Once | OROMUCOSAL | Status: AC
  Administered 2020-06-17: 15 mL via OROMUCOSAL

## 2020-06-17 MED ORDER — ONDANSETRON HCL 4 MG PO TABS
4.0000 mg | ORAL_TABLET | Freq: Four times a day (QID) | ORAL | Status: DC | PRN
  Administered 2020-06-17: 4 mg via ORAL
  Filled 2020-06-17: qty 1

## 2020-06-17 MED ORDER — CEFAZOLIN SODIUM-DEXTROSE 1-4 GM/50ML-% IV SOLN
1.0000 g | Freq: Four times a day (QID) | INTRAVENOUS | Status: AC
  Administered 2020-06-17 (×2): 1 g via INTRAVENOUS
  Filled 2020-06-17 (×2): qty 50

## 2020-06-17 MED ORDER — HYDROCODONE-ACETAMINOPHEN 7.5-325 MG PO TABS
1.0000 | ORAL_TABLET | ORAL | Status: DC | PRN
  Administered 2020-06-17: 2 via ORAL
  Administered 2020-06-17: 1 via ORAL
  Administered 2020-06-18: 2 via ORAL
  Administered 2020-06-18: 1 via ORAL
  Administered 2020-06-18: 2 via ORAL
  Filled 2020-06-17: qty 2
  Filled 2020-06-17: qty 1
  Filled 2020-06-17: qty 2
  Filled 2020-06-17: qty 1
  Filled 2020-06-17: qty 2

## 2020-06-17 MED ORDER — PHENOL 1.4 % MT LIQD: 1.0000 | OROMUCOSAL | Status: DC | PRN

## 2020-06-17 MED ORDER — SODIUM CHLORIDE 0.9 % IV SOLN: INTRAVENOUS | Status: DC

## 2020-06-17 MED ORDER — MORPHINE SULFATE (PF) 2 MG/ML IV SOLN: 0.5000 mg | INTRAVENOUS | Status: DC | PRN

## 2020-06-17 MED ORDER — ALUM & MAG HYDROXIDE-SIMETH 200-200-20 MG/5ML PO SUSP: 30.0000 mL | ORAL | Status: DC | PRN

## 2020-06-17 MED ORDER — FENTANYL CITRATE (PF) 100 MCG/2ML IJ SOLN
25.0000 ug | INTRAMUSCULAR | Status: DC | PRN
  Administered 2020-06-17 (×3): 50 ug via INTRAVENOUS

## 2020-06-17 MED ORDER — MIDAZOLAM HCL 5 MG/5ML IJ SOLN
INTRAMUSCULAR | Status: DC | PRN
  Administered 2020-06-17 (×2): 1 mg via INTRAVENOUS

## 2020-06-17 MED ORDER — LACTATED RINGERS IV SOLN: INTRAVENOUS | Status: DC

## 2020-06-17 MED ORDER — STERILE WATER FOR IRRIGATION IR SOLN
Status: DC | PRN
  Administered 2020-06-17: 2000 mL

## 2020-06-17 MED ORDER — METOCLOPRAMIDE HCL 5 MG/ML IJ SOLN: 5.0000 mg | Freq: Three times a day (TID) | INTRAMUSCULAR | Status: DC | PRN

## 2020-06-17 MED ORDER — CEFAZOLIN SODIUM-DEXTROSE 2-4 GM/100ML-% IV SOLN
2.0000 g | INTRAVENOUS | Status: AC
  Administered 2020-06-17: 2 g via INTRAVENOUS
  Filled 2020-06-17: qty 100

## 2020-06-17 MED ORDER — HYDROCODONE-ACETAMINOPHEN 5-325 MG PO TABS
ORAL_TABLET | ORAL | Status: AC
  Administered 2020-06-17: 2
  Filled 2020-06-17: qty 1

## 2020-06-17 MED ORDER — PROPOFOL 500 MG/50ML IV EMUL
INTRAVENOUS | Status: DC | PRN
  Administered 2020-06-17: 50 ug/kg/min via INTRAVENOUS

## 2020-06-17 MED ORDER — DIPHENHYDRAMINE HCL 12.5 MG/5ML PO ELIX: 12.5000 mg | ORAL_SOLUTION | ORAL | Status: DC | PRN

## 2020-06-17 MED ORDER — BIOTIN 5000 MCG PO TABS: 5000.0000 ug | ORAL_TABLET | Freq: Every day | ORAL | Status: DC

## 2020-06-17 MED ORDER — PANTOPRAZOLE SODIUM 40 MG PO TBEC
40.0000 mg | DELAYED_RELEASE_TABLET | Freq: Every day | ORAL | Status: DC
  Administered 2020-06-18: 40 mg via ORAL
  Filled 2020-06-17: qty 1

## 2020-06-17 MED ORDER — POVIDONE-IODINE 10 % EX SWAB
2.0000 "application " | Freq: Once | CUTANEOUS | Status: AC
  Administered 2020-06-17: 2 via TOPICAL

## 2020-06-17 MED ORDER — VITAMIN D3 25 MCG (1000 UNIT) PO TABS
1000.0000 [IU] | ORAL_TABLET | Freq: Every day | ORAL | Status: DC
  Administered 2020-06-18: 1000 [IU] via ORAL
  Filled 2020-06-17 (×3): qty 1

## 2020-06-17 MED ORDER — SODIUM CHLORIDE 0.9 % IR SOLN
Status: DC | PRN
  Administered 2020-06-17: 1000 mL

## 2020-06-17 MED ORDER — ACETAMINOPHEN 500 MG PO TABS
1000.0000 mg | ORAL_TABLET | Freq: Once | ORAL | Status: AC
  Administered 2020-06-17: 1000 mg via ORAL
  Filled 2020-06-17: qty 2

## 2020-06-17 MED ORDER — LORAZEPAM 0.5 MG PO TABS
0.5000 mg | ORAL_TABLET | Freq: Every evening | ORAL | Status: DC | PRN
  Administered 2020-06-17: 0.5 mg via ORAL
  Filled 2020-06-17: qty 1

## 2020-06-17 MED ORDER — DOCUSATE SODIUM 100 MG PO CAPS
100.0000 mg | ORAL_CAPSULE | Freq: Two times a day (BID) | ORAL | Status: DC
  Administered 2020-06-17 – 2020-06-18 (×2): 100 mg via ORAL
  Filled 2020-06-17 (×2): qty 1

## 2020-06-17 MED ORDER — ACETAMINOPHEN 325 MG PO TABS: 325.0000 mg | ORAL_TABLET | Freq: Four times a day (QID) | ORAL | Status: DC | PRN

## 2020-06-17 MED ORDER — ONDANSETRON HCL 4 MG/2ML IJ SOLN: 4.0000 mg | Freq: Once | INTRAMUSCULAR | Status: DC | PRN

## 2020-06-17 MED ORDER — METHOCARBAMOL 500 MG IVPB - SIMPLE MED
500.0000 mg | Freq: Four times a day (QID) | INTRAVENOUS | Status: DC | PRN
  Administered 2020-06-17: 500 mg via INTRAVENOUS
  Filled 2020-06-17: qty 50

## 2020-06-17 MED ORDER — 0.9 % SODIUM CHLORIDE (POUR BTL) OPTIME
TOPICAL | Status: DC | PRN
  Administered 2020-06-17: 1000 mL

## 2020-06-17 MED ORDER — MENTHOL 3 MG MT LOZG
1.0000 | LOZENGE | OROMUCOSAL | Status: DC | PRN
Start: 2020-06-17 — End: 2020-06-18

## 2020-06-17 MED ORDER — ONDANSETRON HCL 4 MG/2ML IJ SOLN: 4.0000 mg | Freq: Four times a day (QID) | INTRAMUSCULAR | Status: DC | PRN

## 2020-06-17 MED ORDER — TRANEXAMIC ACID-NACL 1000-0.7 MG/100ML-% IV SOLN
1000.0000 mg | INTRAVENOUS | Status: AC
  Administered 2020-06-17: 1000 mg via INTRAVENOUS
  Filled 2020-06-17: qty 100

## 2020-06-17 MED ORDER — BUPIVACAINE IN DEXTROSE 0.75-8.25 % IT SOLN
INTRATHECAL | Status: DC | PRN
  Administered 2020-06-17: 1.6 mL via INTRATHECAL

## 2020-06-17 MED ORDER — METOPROLOL SUCCINATE ER 25 MG PO TB24
12.5000 mg | ORAL_TABLET | Freq: Every day | ORAL | Status: DC
Start: 2020-06-17 — End: 2020-06-18
  Administered 2020-06-18: 12.5 mg via ORAL
  Filled 2020-06-17: qty 1

## 2020-06-17 MED ORDER — PHENYLEPHRINE 40 MCG/ML (10ML) SYRINGE FOR IV PUSH (FOR BLOOD PRESSURE SUPPORT)
PREFILLED_SYRINGE | INTRAVENOUS | Status: AC
  Filled 2020-06-17: qty 10

## 2020-06-17 SURGICAL SUPPLY — 40 items
BAG ZIPLOCK 12X15 (MISCELLANEOUS) IMPLANT
BENZOIN TINCTURE PRP APPL 2/3 (GAUZE/BANDAGES/DRESSINGS) IMPLANT
BLADE SAW SGTL 18X1.27X75 (BLADE) ×2 IMPLANT
BLADE SAW SGTL 18X1.27X75MM (BLADE) ×1
CLOSURE WOUND 1/2 X4 (GAUZE/BANDAGES/DRESSINGS)
COVER PERINEAL POST (MISCELLANEOUS) ×3 IMPLANT
COVER SURGICAL LIGHT HANDLE (MISCELLANEOUS) ×3 IMPLANT
COVER WAND RF STERILE (DRAPES) ×3 IMPLANT
CUP SECTOR GRIPTON 50MM (Cup) ×3 IMPLANT
DRAPE STERI IOBAN 125X83 (DRAPES) ×3 IMPLANT
DRAPE U-SHAPE 47X51 STRL (DRAPES) ×6 IMPLANT
DRSG AQUACEL AG ADV 3.5X10 (GAUZE/BANDAGES/DRESSINGS) ×3 IMPLANT
DURAPREP 26ML APPLICATOR (WOUND CARE) ×3 IMPLANT
ELECT REM PT RETURN 15FT ADLT (MISCELLANEOUS) ×3 IMPLANT
GAUZE XEROFORM 1X8 LF (GAUZE/BANDAGES/DRESSINGS) ×3 IMPLANT
GLOVE ECLIPSE 8.0 STRL XLNG CF (GLOVE) ×3 IMPLANT
GLOVE SRG 8 PF TXTR STRL LF DI (GLOVE) ×2 IMPLANT
GLOVE SURG ENC MOIS LTX SZ7.5 (GLOVE) ×3 IMPLANT
GLOVE SURG UNDER POLY LF SZ8 (GLOVE) ×4
GOWN STRL REUS W/TWL XL LVL3 (GOWN DISPOSABLE) ×6 IMPLANT
HANDPIECE INTERPULSE COAX TIP (DISPOSABLE) ×2
HEAD FEM STD 32X+1 STRL (Hips) ×3 IMPLANT
HOLDER FOLEY CATH W/STRAP (MISCELLANEOUS) ×3 IMPLANT
KIT TURNOVER KIT A (KITS) ×3 IMPLANT
LINER ACETABULAR 32X50 (Liner) ×3 IMPLANT
PACK ANTERIOR HIP CUSTOM (KITS) ×3 IMPLANT
PENCIL SMOKE EVACUATOR (MISCELLANEOUS) IMPLANT
SET HNDPC FAN SPRY TIP SCT (DISPOSABLE) ×1 IMPLANT
STAPLER VISISTAT 35W (STAPLE) ×3 IMPLANT
STEM CORAIL KA12 (Stem) ×3 IMPLANT
STRIP CLOSURE SKIN 1/2X4 (GAUZE/BANDAGES/DRESSINGS) IMPLANT
SUT ETHIBOND NAB CT1 #1 30IN (SUTURE) ×3 IMPLANT
SUT ETHILON 2 0 PS N (SUTURE) IMPLANT
SUT MNCRL AB 4-0 PS2 18 (SUTURE) IMPLANT
SUT VIC AB 0 CT1 36 (SUTURE) ×3 IMPLANT
SUT VIC AB 1 CT1 36 (SUTURE) ×3 IMPLANT
SUT VIC AB 2-0 CT1 27 (SUTURE) ×4
SUT VIC AB 2-0 CT1 TAPERPNT 27 (SUTURE) ×2 IMPLANT
TRAY FOLEY MTR SLVR 14FR STAT (SET/KITS/TRAYS/PACK) ×3 IMPLANT
TRAY FOLEY MTR SLVR 16FR STAT (SET/KITS/TRAYS/PACK) IMPLANT

## 2020-06-17 NOTE — Transfer of Care (Signed)
Immediate Anesthesia Transfer of Care Note  Patient: Angel Rivera  Procedure(s) Performed: RIGHT TOTAL HIP ARTHROPLASTY ANTERIOR APPROACH (Right Hip)  Patient Location: PACU  Anesthesia Type:MAC and Spinal  Level of Consciousness: awake and alert   Airway & Oxygen Therapy: Patient Spontanous Breathing and Patient connected to face mask oxygen  Post-op Assessment: Report given to RN and Post -op Vital signs reviewed and stable  Post vital signs: Reviewed and stable  Last Vitals:  Vitals Value Taken Time  BP 108/60 06/17/20 0847  Temp    Pulse 70 06/17/20 0848  Resp 15 06/17/20 0848  SpO2 100 % 06/17/20 0848  Vitals shown include unvalidated device data.  Last Pain:  Vitals:   06/17/20 0607  TempSrc: Oral  PainSc:       Patients Stated Pain Goal: 3 (50/53/97 6734)  Complications: No complications documented.

## 2020-06-17 NOTE — Anesthesia Procedure Notes (Signed)
Spinal  Patient location during procedure: OR Start time: 06/17/2020 7:15 AM End time: 06/17/2020 7:19 AM Staffing Performed: anesthesiologist  Anesthesiologist: Catalina Gravel, MD Preanesthetic Checklist Completed: patient identified, IV checked, risks and benefits discussed, surgical consent, monitors and equipment checked, pre-op evaluation and timeout performed Spinal Block Patient position: sitting Prep: DuraPrep and site prepped and draped Patient monitoring: continuous pulse ox and blood pressure Approach: midline Location: L3-4 Injection technique: single-shot Needle Needle type: Pencan  Needle gauge: 24 G Assessment Sensory level: T8 Additional Notes Functioning IV was confirmed and monitors were applied. Sterile prep and drape, including hand hygiene, mask and sterile gloves were used. The patient was positioned and the spine was prepped. The skin was anesthetized with lidocaine.  Free flow of clear CSF was obtained prior to injecting local anesthetic into the CSF.  The spinal needle aspirated freely following injection.  The needle was carefully withdrawn.  The patient tolerated the procedure well. Consent was obtained prior to procedure with all questions answered and concerns addressed. Risks including but not limited to bleeding, infection, nerve damage, paralysis, failed block, inadequate analgesia, allergic reaction, high spinal, itching and headache were discussed and the patient wished to proceed.   Hoy Morn, MD

## 2020-06-17 NOTE — Brief Op Note (Signed)
06/17/2020  8:27 AM  PATIENT:  Denzil Hughes  70 y.o. female  PRE-OPERATIVE DIAGNOSIS:  osteoarthritis right hip  POST-OPERATIVE DIAGNOSIS:  osteoarthritis right hip  PROCEDURE:  Procedure(s): RIGHT TOTAL HIP ARTHROPLASTY ANTERIOR APPROACH (Right)  SURGEON:  Surgeon(s) and Role:    Mcarthur Rossetti, MD - Primary  PHYSICIAN ASSISTANT:  Benita Stabile, PA-C  ANESTHESIA:   spinal  EBL: 100 cc  COUNTS:  YES  TOURNIQUET:  * No tourniquets in log *  DICTATION: .Other Dictation: Dictation Number [id number]  PLAN OF CARE: Admit for overnight observation  PATIENT DISPOSITION:  PACU - hemodynamically stable.   Delay start of Pharmacological VTE agent (>24hrs) due to surgical blood loss or risk of bleeding: no

## 2020-06-17 NOTE — Anesthesia Postprocedure Evaluation (Signed)
Anesthesia Post Note  Patient: Angel Rivera  Procedure(s) Performed: RIGHT TOTAL HIP ARTHROPLASTY ANTERIOR APPROACH (Right Hip)     Patient location during evaluation: PACU Anesthesia Type: Spinal Level of consciousness: oriented, awake and alert and awake Pain management: pain level controlled Vital Signs Assessment: post-procedure vital signs reviewed and stable Respiratory status: spontaneous breathing, respiratory function stable, patient connected to nasal cannula oxygen and nonlabored ventilation Cardiovascular status: blood pressure returned to baseline and stable Postop Assessment: no headache, no backache, no apparent nausea or vomiting and spinal receding Anesthetic complications: no   No complications documented.  Last Vitals:  Vitals:   06/17/20 0915 06/17/20 0920  BP: 125/66   Pulse: 63 63  Resp: 16 13  Temp:    SpO2: 100% 99%    Last Pain:  Vitals:   06/17/20 0920  TempSrc:   PainSc: 5                  Catalina Gravel

## 2020-06-17 NOTE — Op Note (Signed)
NAME: Angel Rivera, Angel Rivera MEDICAL RECORD [id number] ACCOUNT [id number] DATE OF BIRTH:Jan 22, 1951 FACILITY: [facility] LOCATION: [facility] PHYSICIAN:Sylvia Kondracki [place], MD  OPERATIVE REPORT  DATE OF PROCEDURE:  06/17/2020  PREOPERATIVE DIAGNOSIS:  Primary osteoarthritis and degenerative joint disease, right hip.  POSTOPERATIVE DIAGNOSIS:  Primary osteoarthritis and degenerative joint disease, right hip.  PROCEDURE:  Right total hip arthroplasty through direct anterior approach.  IMPLANTS:  DePuy Sector Gription acetabular component size 50, size 32+0 neutral polyethylene liner, size 12 Corail femoral component with standard offset, size 32+1 metal hip ball.  SURGEON:  Lind Guest. Ninfa Linden, MD  ASSISTANT:  Erskine Emery, PA-C.  ANESTHESIA:  Spinal.  ANTIBIOTICS:  Two grams IV Ancef.  ESTIMATED BLOOD LOSS:  100 mL.  COMPLICATIONS:  None.  INDICATIONS:  The patient is a very pleasant 70 year old female with debilitating arthritis of actually both her hips.  Both of them are quite severe.  Both hurt equally bad.  She does wish to proceed with a right total hip arthroplasty at this point.   She understands the risk of acute blood loss anemia, nerve or vessel injury, fracture, infection, dislocation, DVT and implant failure as well as skin and soft tissue issues.  She understands our goals are to decrease pain, improve mobility and overall  improve quality of life.  DESCRIPTION OF PROCEDURE:  After informed consent was obtained and appropriate right hip was marked she was brought to the operating room and sat up on a stretcher where spinal anesthesia was obtained.  She was then laid in supine position on a  stretcher.  Foley catheter was placed and traction boots were placed on both her feet.  Of note, her right side is actually longer than the left.  She was placed supine on the Hana fracture table, the perineal post in place and both legs in line skeletal  traction device and no  traction applied.  Her right operative hip was prepped and draped with DuraPrep and sterile drapes.  A time-out was called to identify correct patient, correct right hip.  I then made an incision just inferior and posterior to the  anterior superior iliac spine and carried this obliquely down the leg.  We dissected down tensor fascia lata muscle.  Tensor fascia was then divided longitudinally to proceed with direct anterior approach to the hip.  We identified and cauterized  circumflex vessels and identified the hip capsule, entered the hip capsule in an L-type format, finding moderate joint effusion and significant periarticular osteophytes all around the lateral femoral head and neck.  I placed Cobra retractors within the  joint capsule around the medial and lateral femoral neck and made our femoral neck cut with an oscillating saw just proximal to the lesser trochanter.  We completed this with an osteotome.  I placed a corkscrew guide in the femoral head and removed the  femoral head in its entirety and found a wide area devoid of cartilage.  I then removed periarticular osteophytes around the acetabular rim and removed remnants of acetabular labrum and other debris.  I placed a bent Hohmann over the medial acetabular  rim.  We then began reaming under direct visualization from a size 43 reamer in stepwise increments going up to a size 49 with all reamers placed under direct visualization, the last reamer was also placed under direct fluoroscopy, so I could obtain my  depth of reaming by inclination and anteversion.  I then placed the real DePuy Sector Gription acetabular component size 50 and a 32+0 neutral polyethylene  liner for that size acetabular component.  Attention was then turned to the femur.  With the leg  externally rotated to 120 degrees, extended and adducted, we were able to place a Mueller retractor medially and Hohmann retractor above the greater trochanter.  We released lateral joint  capsule and used a box-cutting osteotome to enter the femoral  canal and a rongeur to lateralize, then began broaching using the Corail broaching system from a size 8 going up to a size 12.  With a size 12 in place, we trialed a standard offset femoral neck and a 32+1 hip ball, reduced this in the acetabulum.  We  were pleased with the leg length, offset, range of motion and stability assessed radiographically and mechanically.  We then dislocated the hip and removed the trial components.  We placed the real Corail femoral component size 12 with standard offset  and the real 32+1 metal hip ball and again reduced this in the acetabulum.  We were pleased again with leg length, offset, range of motion and stability.  Of note, she is longer on this right side and will remain longer until we address the left side.   We then irrigated the soft tissue with normal saline solution using pulsatile lavage.  We closed the joint capsule with interrupted #1 Ethibond suture, followed by #1 Vicryl to close the tensor fascia, 0 Vicryl was used to close deep tissue and 2-0  Vicryl was used to close the subcutaneous tissue.  The skin was closed with staples.  Aquacel dressing was applied.  She was taken off the Hana table and taken to recovery room in stable condition with all final counts being correct.  No complications  noted.  Of note, Benita Stabile, PA-C did assist during the entire case and his assistance was crucial for facilitating all aspects of this case.  HN/NUANCE  D:06/17/2020 T:06/17/2020 JOB:[id number]

## 2020-06-17 NOTE — Interval H&P Note (Signed)
History and Physical Interval Note: The patient understands that she is here today for right total hip replacement to treat her right hip osteoarthritis.  There has been no interval change in her medical status.  See recent H&P.  The risks and benefits of surgery have been explained in detail and informed consent is obtained.  The right hip has been marked.  06/17/2020 6:58 AM  Angel Rivera  has presented today for surgery, with the diagnosis of osteoarthritis right hip.  The various methods of treatment have been discussed with the patient and family. After consideration of risks, benefits and other options for treatment, the patient has consented to  Procedure(s): RIGHT TOTAL HIP ARTHROPLASTY ANTERIOR APPROACH (Right) as a surgical intervention.  The patient's history has been reviewed, patient examined, no change in status, stable for surgery.  I have reviewed the patient's chart and labs.  Questions were answered to the patient's satisfaction.     Mcarthur Rossetti

## 2020-06-17 NOTE — Evaluation (Signed)
Physical Therapy Evaluation Patient Details Name: Angel Rivera MRN: [id number] DOB: 1950-10-11 Today's Date: 06/17/2020   History of Present Illness  Patient is 70 y.o. female s/p Rt THA anterior approach on 06/17/20 with PMH significant for HTN, HLD, CAD, MI in 2011 s/p stent, OA.    Clinical Impression  ADITI ROVIRA is a 70 y.o. female POD 0 s/p Rt THA. Patient reports independence with mobility at baseline. Patient is now limited by functional impairments (see PT problem list below) and requires min guard for transfers and gait with RW. Patient was able to ambulate ~200 feet with RW and min guard assist. Patient was noted to have vaulting with Rt LE during gait and appears to be slightly elevated in stance on Rt compared to Lt hip. Patient instructed in exercise to facilitate ROM and circulation to manage edema. Patient will benefit from continued skilled PT interventions to address impairments and progress towards PLOF. Acute PT will follow to progress mobility and stair training in preparation for safe discharge home.     Follow Up Recommendations Follow surgeon's recommendation for DC plan and follow-up therapies;Home health PT    Equipment Recommendations  None recommended by PT    Recommendations for Other Services       Precautions / Restrictions Precautions Precautions: Fall Restrictions Weight Bearing Restrictions: No      Mobility  Bed Mobility Overal bed mobility: Needs Assistance Bed Mobility: Supine to Sit     Supine to sit: Supervision;HOB elevated     General bed mobility comments: pt using bed rails and walked bil LE's off EOB, some extra time needed, supervision for safety.    Transfers Overall transfer level: Needs assistance Equipment used: Rolling walker (2 wheeled) Transfers: Sit to/from Stand Sit to Stand: Min guard         General transfer comment: cues for technique with hand placement on RW, no assist needed to rise, pt steady in  standing.  Ambulation/Gait Ambulation/Gait assistance: Min guard Gait Distance (Feet): 200 Feet Assistive device: Rolling walker (2 wheeled) Gait Pattern/deviations: Step-to pattern;Step-through pattern;Decreased stride length (hitch with Rt knee flexed in stance and pt reporting height discrepency complaint) Gait velocity: fair   General Gait Details: cues for safe proximity to RW, no overt LOB noted and pt progressed from step to to step through pattern  Stairs            Wheelchair Mobility    Modified Rankin (Stroke Patients Only)       Balance Overall balance assessment: Needs assistance Sitting-balance support: Feet supported Sitting balance-Leahy Scale: Good     Standing balance support: During functional activity;Bilateral upper extremity supported Standing balance-Leahy Scale: Fair                               Pertinent Vitals/Pain Pain Assessment: 0-10 Pain Score: 2  Pain Location: Rt hip Pain Descriptors / Indicators: Aching;Discomfort Pain Intervention(s): Limited activity within patient's tolerance;Monitored during session;Repositioned;Ice applied    Home Living Family/patient expects to be discharged to:: Private residence Living Arrangements: Children;Alone Available Help at Discharge: Family Type of Home: House Home Access: Stairs to enter Entrance Stairs-Rails: Right Entrance Stairs-Number of Steps: 1 Home Layout: One level Home Equipment: Walker - 2 wheels;Cane - single point      Prior Function Level of Independence: Independent               Hand Dominance   Dominant Hand:  Right    Extremity/Trunk Assessment   Upper Extremity Assessment Upper Extremity Assessment: Overall WFL for tasks assessed    Lower Extremity Assessment Lower Extremity Assessment: Overall WFL for tasks assessed    Cervical / Trunk Assessment Cervical / Trunk Assessment: Normal  Communication   Communication: No difficulties   Cognition Arousal/Alertness: Awake/alert Behavior During Therapy: WFL for tasks assessed/performed Overall Cognitive Status: Within Functional Limits for tasks assessed                                        General Comments      Exercises Total Joint Exercises Ankle Circles/Pumps: AROM;Both;20 reps;Seated   Assessment/Plan    PT Assessment Patient needs continued PT services  PT Problem List Decreased strength;Decreased range of motion;Decreased activity tolerance;Decreased balance;Decreased mobility;Decreased knowledge of use of DME;Decreased knowledge of precautions       PT Treatment Interventions DME instruction;Gait training;Stair training;Functional mobility training;Therapeutic exercise;Therapeutic activities;Balance training;Patient/family education    PT Goals (Current goals can be found in the Care Plan section)  Acute Rehab PT Goals Patient Stated Goal: get back to working outside PT Goal Formulation: With patient Time For Goal Achievement: 06/24/20 Potential to Achieve Goals: Good    Frequency 7X/week   Barriers to discharge        Co-evaluation               AM-PAC PT "6 Clicks" Mobility  Outcome Measure Help needed turning from your back to your side while in a flat bed without using bedrails?: None Help needed moving from lying on your back to sitting on the side of a flat bed without using bedrails?: A Little Help needed moving to and from a bed to a chair (including a wheelchair)?: A Little Help needed standing up from a chair using your arms (e.g., wheelchair or bedside chair)?: A Little Help needed to walk in hospital room?: A Little Help needed climbing 3-5 steps with a railing? : A Little 6 Click Score: 19    End of Session Equipment Utilized During Treatment: Gait belt Activity Tolerance: Patient tolerated treatment well Patient left: in chair;with call bell/phone within reach;with chair alarm set;with family/visitor  present Nurse Communication: Mobility status PT Visit Diagnosis: Muscle weakness (generalized) (M62.81);Difficulty in walking, not elsewhere classified (R26.2)    Time: 2774-1287 PT Time Calculation (min) (ACUTE ONLY): 24 min   Charges:   PT Evaluation $PT Eval Low Complexity: 1 Low PT Treatments $Gait Training: 8-22 mins        Verner Mould, DPT Acute Rehabilitation Services Office [phone] Pager [phone]    Jacques Navy 06/17/2020, 2:33 PM

## 2020-06-18 DIAGNOSIS — M1611 Unilateral primary osteoarthritis, right hip: Secondary | ICD-10-CM | POA: Diagnosis not present

## 2020-06-18 LAB — CBC
HCT: 30.7 % — ABNORMAL LOW (ref 36.0–46.0)
Hemoglobin: 10.1 g/dL — ABNORMAL LOW (ref 12.0–15.0)
MCH: 32 pg (ref 26.0–34.0)
MCHC: 32.9 g/dL (ref 30.0–36.0)
MCV: 97.2 fL (ref 80.0–100.0)
Platelets: 233 10*3/uL (ref 150–400)
RBC: 3.16 MIL/uL — ABNORMAL LOW (ref 3.87–5.11)
RDW: 12 % (ref 11.5–15.5)
WBC: 9.8 10*3/uL (ref 4.0–10.5)
nRBC: 0 % (ref 0.0–0.2)

## 2020-06-18 LAB — BASIC METABOLIC PANEL
Anion gap: 9 (ref 5–15)
BUN: 9 mg/dL (ref 8–23)
CO2: 23 mmol/L (ref 22–32)
Calcium: 8.2 mg/dL — ABNORMAL LOW (ref 8.9–10.3)
Chloride: 105 mmol/L (ref 98–111)
Creatinine, Ser: 0.85 mg/dL (ref 0.44–1.00)
GFR, Estimated: >60 mL/min (ref >60)
Glucose, Bld: 125 mg/dL — ABNORMAL HIGH (ref 70–99)
Potassium: 3.8 mmol/L (ref 3.5–5.1)
Sodium: 137 mmol/L (ref 135–145)

## 2020-06-18 MED ORDER — ASPIRIN 81 MG PO CHEW: 81.0000 mg | CHEWABLE_TABLET | Freq: Two times a day (BID) | ORAL | 0 refills | Status: DC

## 2020-06-18 MED ORDER — HYDROCODONE-ACETAMINOPHEN 5-325 MG PO TABS: 1.0000 | ORAL_TABLET | ORAL | 0 refills | Status: DC | PRN

## 2020-06-18 MED ORDER — METHOCARBAMOL 500 MG PO TABS: 500.0000 mg | ORAL_TABLET | Freq: Four times a day (QID) | ORAL | 1 refills | Status: DC | PRN

## 2020-06-18 NOTE — Plan of Care (Signed)
  Problem: Education: Goal: Knowledge of the prescribed therapeutic regimen will improve Outcome: Progressing   Problem: Activity: Goal: Ability to avoid complications of mobility impairment will improve Outcome: Progressing   Problem: Pain Management: Goal: Pain level will decrease with appropriate interventions Outcome: Progressing   

## 2020-06-18 NOTE — TOC Progression Note (Signed)
Transition of Care [facility]) - Progression Note    Patient Details  Name: Angel Rivera MRN: [id number] Date of Birth: 06-Apr-1951  Transition of Care [facility]) CM/SW Contact  Joaquin Courts, RN Phone Number: 06/18/2020, 11:52 AM  Clinical Narrative:    [facility] for HHPT.  Patient reports she has rolling walker and 3in1.   Expected Discharge Plan: [place] Barriers to Discharge: No Barriers Identified  Expected Discharge Plan and Services Expected Discharge Plan: [place]   Discharge Planning Services: CM Consult Post Acute Care Choice: [place] arrangements for the past 2 months: Single Family Home Expected Discharge Date: 06/18/20               DME Arranged: N/A DME Agency: NA       HH Arranged: PT HH Agency: [facility] [employer]) Date [place]: 06/18/20 Time [place]: 1150 Representative spoke with at [place]: pre-arranged in md office   Social Determinants of Health ([place]) Interventions    Readmission Risk Interventions No flowsheet data found.

## 2020-06-18 NOTE — Discharge Instructions (Signed)

## 2020-06-18 NOTE — Discharge Summary (Signed)
Patient ID: Angel Rivera MRN: [id number] DOB/AGE: 08/08/1950 70 y.o.  Admit date: 06/17/2020 Discharge date: 06/18/2020  Admission Diagnoses:  Principal Problem:   Unilateral primary osteoarthritis, right hip Active Problems:   Status post total replacement of right hip   Discharge Diagnoses:  Same  Past Medical History:  Diagnosis Date  . Arthritis   . Coronary artery disease    NATIVE VESSEL (ICD-414.01)-cath 08/17/09 with subtotally occluded RCA, moderate disease LAD.  Marland Kitchen Hyperlipidemia   . Hypotension   . Myocardial infarction ([place]) 08/17/2009  . Tobacco user     Surgeries: Procedure(s): RIGHT TOTAL HIP ARTHROPLASTY ANTERIOR APPROACH on 06/17/2020   Consultants:   Discharged Condition: Improved  Hospital Course: Angel Rivera is an 70 y.o. female who was admitted 06/17/2020 for operative treatment ofUnilateral primary osteoarthritis, right hip. Patient has severe unremitting pain that affects sleep, daily activities, and work/hobbies. After pre-op clearance the patient was taken to the operating room on 06/17/2020 and underwent  Procedure(s): RIGHT TOTAL HIP ARTHROPLASTY ANTERIOR APPROACH.    Patient was given perioperative antibiotics:  Anti-infectives (From admission, onward)   Start     Dose/Rate Route Frequency Ordered Stop   06/17/20 1300  ceFAZolin (ANCEF) IVPB 1 g/50 mL premix        1 g 100 mL/hr over 30 Minutes Intravenous Every 6 hours 06/17/20 0905 06/17/20 1848   06/17/20 0912  ceFAZolin (ANCEF) 1-4 GM/50ML-% IVPB       Note to Pharmacy: Dara Lords   : cabinet override      06/17/20 0912 06/17/20 2129   06/17/20 0600  ceFAZolin (ANCEF) IVPB 2g/100 mL premix        2 g 200 mL/hr over 30 Minutes Intravenous On call to O.R. 06/17/20 1540 06/17/20 0717       Patient was given sequential compression devices, early ambulation, and chemoprophylaxis to prevent DVT.  Patient benefited maximally from hospital stay and there were no complications.    Recent  vital signs:  Patient Vitals for the past 24 hrs:  BP Temp Temp src Pulse Resp SpO2  06/18/20 0929 (!) 130/58 98.8 F (37.1 C) Oral 88 17 98 %  06/18/20 0505 (!) 134/59 99.6 F (37.6 C) Oral 97 15 99 %  06/18/20 0000 108/63 99.4 F (37.4 C) Oral 83 14 98 %  06/17/20 1956 137/66 98.8 F (37.1 C) Oral 81 16 99 %  06/17/20 1417 (!) 142/57 98.2 F (36.8 C) Oral 79 17 100 %  06/17/20 1321 139/60 98.2 F (36.8 C) Oral 75 18 100 %  06/17/20 1157 130/61 98.1 F (36.7 C) Oral 71 17 100 %     Recent laboratory studies:  Recent Labs    06/18/20 0322  WBC 9.8  HGB 10.1*  HCT 30.7*  PLT 233  NA 137  K 3.8  CL 105  CO2 23  BUN 9  CREATININE 0.85  GLUCOSE 125*  CALCIUM 8.2*     Discharge Medications:   Allergies as of 06/18/2020      Reactions   Atorvastatin Other (See Comments)   Leg pain      Medication List    STOP taking these medications   aspirin EC 81 MG tablet Replaced by: aspirin 81 MG chewable tablet   traMADol 50 MG tablet Commonly known as: ULTRAM     TAKE these medications   ascorbic acid 500 MG tablet Commonly known as: VITAMIN C Take 500 mg by mouth daily.   aspirin 81 MG chewable  tablet Chew 1 tablet (81 mg total) by mouth 2 (two) times daily. Replaces: aspirin EC 81 MG tablet   B-complex with vitamin C tablet Take 1 tablet by mouth daily.   Biotin 5000 MCG Tabs Take 5,000 mcg by mouth daily.   cholecalciferol 25 MCG (1000 UNIT) tablet Commonly known as: VITAMIN D Take 1,000 Units by mouth daily.   furosemide 40 MG tablet Commonly known as: LASIX Take 1 tablet (40 mg total) by mouth daily as needed for edema.   HYDROcodone-acetaminophen 5-325 MG tablet Commonly known as: NORCO/VICODIN Take 1-2 tablets by mouth every 4 (four) hours as needed for moderate pain (pain score 4-6).   ibuprofen 200 MG tablet Commonly known as: ADVIL Take 400-600 mg by mouth daily as needed for headache or moderate pain.   LORazepam 0.5 MG tablet Commonly  known as: ATIVAN Take 0.5 mg by mouth at bedtime as needed for sleep.   lovastatin 40 MG tablet Commonly known as: MEVACOR Take 1 tablet (40 mg total) by mouth at bedtime. Please keep upcoming appt in January 2022 with Dr. Angelena Form before anymore refills. Thank you   methocarbamol 500 MG tablet Commonly known as: ROBAXIN Take 1 tablet (500 mg total) by mouth every 6 (six) hours as needed for muscle spasms.   metoprolol succinate 25 MG 24 hr tablet Commonly known as: TOPROL-XL Take 0.5 tablets (12.5 mg total) by mouth daily. Please keep upcoming appt in January 2022 with Dr. Angelena Form before anymore refills. Thank you   milk thistle 175 MG tablet Take 175 mg by mouth daily.            Durable Medical Equipment  (From admission, onward)         Start     Ordered   06/17/20 1103  DME 3 n 1  Once        06/17/20 1102   06/17/20 1103  DME Walker rolling  Once       Question Answer Comment  Walker: With 5 Inch Wheels   Patient needs a walker to treat with the following condition Status post total replacement of right hip      06/17/20 1102          Diagnostic Studies: DG Pelvis Portable  Result Date: 06/17/2020 CLINICAL DATA:  Status post total hip replacement EXAM: PORTABLE PELVIS 1-2 VIEWS COMPARISON:  Intraoperative right hip images June 17, 2020 FINDINGS: Frontal view of pelvis and hips submitted. There is a total hip replacement right with prosthetic components well-seated on frontal view. No fracture or dislocation. 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 Acute postoperative changes noted on the right. IMPRESSION: 1. Total hip replacement on the right with prosthetic components well-seated on frontal view. No fracture or dislocation. 2. 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  Electronically Signed   By: Lowella Grip III M.D.   On: 06/17/2020 09:23   DG C-Arm 1-60 Min-No Report  Result Date: 06/17/2020 Fluoroscopy was utilized by the requesting physician.  No radiographic interpretation.   DG HIP OPERATIVE UNILAT W OR W/O PELVIS RIGHT  Result Date: 06/17/2020 CLINICAL DATA:  Right total hip replacement. EXAM: OPERATIVE RIGHT HIP (WITH PELVIS IF PERFORMED) 3 VIEWS TECHNIQUE: Fluoroscopic spot image(s) were submitted for interpretation post-operatively. COMPARISON:  July 20, 2019. FINDINGS: Fluoro time: 18 seconds. Three C-arm fluoroscopic images  were obtained intraoperatively and submitted for post operative interpretation. These images demonstrate surgical changes of right total hip arthroplasty with good alignment. No unexpected findings. Partially imaged left hip degenerative change. Please see the performing provider's procedural report for further detail. IMPRESSION: Right total hip arthroplasty. Electronically Signed   By: Margaretha Sheffield MD   On: 06/17/2020 08:39    Disposition: Discharge disposition: 01-Home or [place]    Mcarthur Rossetti, MD Follow up in 2 week(s).   Specialty: Orthopedic Surgery Contact information: [address] [place] [address] [phone]                Signed: Mcarthur Rossetti 06/18/2020, 11:37 AM

## 2020-06-18 NOTE — Progress Notes (Signed)
Subjective: 1 Day Post-Op Procedure(s) (LRB): RIGHT TOTAL HIP ARTHROPLASTY ANTERIOR APPROACH (Right) Patient reports pain as moderate.  Has worked well with therapy.  Objective: Vital signs in last 24 hours: Temp:  [98.1 F (36.7 C)-99.6 F (37.6 C)] 98.8 F (37.1 C) (02/12 0929) Pulse Rate:  [71-97] 88 (02/12 0929) Resp:  [14-18] 17 (02/12 0929) BP: (108-142)/(57-66) 130/58 (02/12 0929) SpO2:  [98 %-100 %] 98 % (02/12 0929)  Intake/Output from previous day: 02/11 0701 - 02/12 0700 In: 3087.3 [P.O.:1000; I.V.:1987.3; IV Piggyback:100] Out: 2050 [Urine:1950; Blood:100] Intake/Output this shift: Total I/O In: 240 [P.O.:240] Out: 600 [Urine:600]  Recent Labs    06/18/20 0322  HGB 10.1*   Recent Labs    06/18/20 0322  WBC 9.8  RBC 3.16*  HCT 30.7*  PLT 233   Recent Labs    06/18/20 0322  NA 137  K 3.8  CL 105  CO2 23  BUN 9  CREATININE 0.85  GLUCOSE 125*  CALCIUM 8.2*   No results for input(s): LABPT, INR in the last 72 hours.  Sensation intact distally Intact pulses distally Dorsiflexion/Plantar flexion intact Incision: dressing C/D/I   Assessment/Plan: 1 Day Post-Op Procedure(s) (LRB): RIGHT TOTAL HIP ARTHROPLASTY ANTERIOR APPROACH (Right) Up with therapy Discharge home with home health today.    Patient's anticipated LOS is less than 2 midnights, meeting these requirements: - Younger than 2 - Lives within 1 hour of care - Has a competent adult at home to recover with post-op recover - NO history of  - Chronic pain requiring opiods  - Diabetes  - Coronary Artery Disease  - Heart failure  - Heart attack  - Stroke  - DVT/VTE  - Cardiac arrhythmia  - Respiratory Failure/COPD  - Renal failure  - Anemia  - Advanced Liver disease       Mcarthur Rossetti 06/18/2020, 11:35 AM

## 2020-06-18 NOTE — Progress Notes (Signed)
Physical Therapy Treatment Patient Details Name: Angel Rivera MRN: [id number] DOB: 07/19/1950 Today's Date: 06/18/2020    History of Present Illness Patient is 70 y.o. female s/p Rt THA anterior approach on 06/17/20 with PMH significant for HTN, HLD, CAD, MI in 2011 s/p stent, OA.    PT Comments    Pt very motivated and progressing well with mobility.  Pt up to ambulate in hall, negotiate stairs, review bed mobility, review car transfers and initiate HEP.  Pt eager for dc home this date.    Follow Up Recommendations  Follow surgeon's recommendation for DC plan and follow-up therapies;Home health PT     Equipment Recommendations  None recommended by PT    Recommendations for Other Services       Precautions / Restrictions Precautions Precautions: Fall Restrictions Weight Bearing Restrictions: No    Mobility  Bed Mobility Overal bed mobility: Needs Assistance Bed Mobility: Supine to Sit;Sit to Supine     Supine to sit: Supervision;HOB elevated Sit to supine: Supervision   General bed mobility comments: cues for sequence, use of L LE to self assist, and use of belt to self assist R LE    Transfers Overall transfer level: Needs assistance Equipment used: Rolling walker (2 wheeled) Transfers: Sit to/from Stand Sit to Stand: Min guard;Supervision         General transfer comment: cues for technique with hand placement on RW, no assist needed to rise, pt steady in standing.  Ambulation/Gait Ambulation/Gait assistance: Min guard;Supervision Gait Distance (Feet): 200 Feet Assistive device: Rolling walker (2 wheeled) Gait Pattern/deviations: Step-to pattern;Step-through pattern;Decreased stride length     General Gait Details: min cues for posture and position from RW; marked improvement in gait with addition of shoe to L foot to equalize LE length   Stairs Stairs: Yes Stairs assistance: Min assist Stair Management: No rails;Step to  pattern;Forwards;Backwards;With walker Number of Stairs: 3 General stair comments: single step twice fwd and once bkwd with RW and cues for sequence and RW/foot placement   Wheelchair Mobility    Modified Rankin (Stroke Patients Only)       Balance Overall balance assessment: Needs assistance Sitting-balance support: Feet supported Sitting balance-Leahy Scale: Good     Standing balance support: During functional activity;Bilateral upper extremity supported Standing balance-Leahy Scale: Fair                              Cognition Arousal/Alertness: Awake/alert Behavior During Therapy: WFL for tasks assessed/performed Overall Cognitive Status: Within Functional Limits for tasks assessed                                        Exercises Total Joint Exercises Ankle Circles/Pumps: AROM;Both;20 reps;Seated Quad Sets: AROM;Both;10 reps;Supine Heel Slides: AAROM;Right;20 reps;Supine Hip ABduction/ADduction: AAROM;Right;15 reps;Supine    General Comments        Pertinent Vitals/Pain Pain Assessment: 0-10 Pain Score: 4  Pain Location: Rt hip Pain Descriptors / Indicators: Aching;Discomfort Pain Intervention(s): Limited activity within patient's tolerance;Monitored during session;Premedicated before session;Ice applied    Home Living                      Prior Function            PT Goals (current goals can now be found in the care plan section) Acute Rehab PT Goals  Patient Stated Goal: get back to working outside PT Goal Formulation: With patient Time For Goal Achievement: 06/24/20 Potential to Achieve Goals: Good Progress towards PT goals: Progressing toward goals    Frequency    7X/week      PT Plan Current plan remains appropriate    Co-evaluation              AM-PAC PT "6 Clicks" Mobility   Outcome Measure  Help needed turning from your back to your side while in a flat bed without using bedrails?:  None Help needed moving from lying on your back to sitting on the side of a flat bed without using bedrails?: A Little Help needed moving to and from a bed to a chair (including a wheelchair)?: A Little Help needed standing up from a chair using your arms (e.g., wheelchair or bedside chair)?: A Little Help needed to walk in hospital room?: A Little Help needed climbing 3-5 steps with a railing? : A Little 6 Click Score: 19    End of Session Equipment Utilized During Treatment: Gait belt Activity Tolerance: Patient tolerated treatment well Patient left: in bed;with call bell/phone within reach;with bed alarm set Nurse Communication: Mobility status PT Visit Diagnosis: Muscle weakness (generalized) (M62.81);Difficulty in walking, not elsewhere classified (R26.2)     Time: 5009-3818 PT Time Calculation (min) (ACUTE ONLY): 42 min  Charges:  $Gait Training: 8-22 mins $Therapeutic Exercise: 8-22 mins $Therapeutic Activity: 8-22 mins                     Angel Rivera PT Acute Rehabilitation Services Pager [phone] Office [phone]    Angel Rivera 06/18/2020, 12:01 PM

## 2020-06-19 DIAGNOSIS — Z87891 Personal history of nicotine dependence: Secondary | ICD-10-CM | POA: Diagnosis not present

## 2020-06-19 DIAGNOSIS — M1612 Unilateral primary osteoarthritis, left hip: Secondary | ICD-10-CM | POA: Diagnosis not present

## 2020-06-19 DIAGNOSIS — I252 Old myocardial infarction: Secondary | ICD-10-CM | POA: Diagnosis not present

## 2020-06-19 DIAGNOSIS — I959 Hypotension, unspecified: Secondary | ICD-10-CM | POA: Diagnosis not present

## 2020-06-19 DIAGNOSIS — I2510 Atherosclerotic heart disease of native coronary artery without angina pectoris: Secondary | ICD-10-CM | POA: Diagnosis not present

## 2020-06-19 DIAGNOSIS — Z96641 Presence of right artificial hip joint: Secondary | ICD-10-CM | POA: Diagnosis not present

## 2020-06-19 DIAGNOSIS — E7800 Pure hypercholesterolemia, unspecified: Secondary | ICD-10-CM | POA: Diagnosis not present

## 2020-06-19 DIAGNOSIS — Z471 Aftercare following joint replacement surgery: Secondary | ICD-10-CM | POA: Diagnosis not present

## 2020-06-20 ENCOUNTER — Encounter (HOSPITAL_COMMUNITY): Payer: Self-pay | Admitting: Orthopaedic Surgery

## 2020-06-22 DIAGNOSIS — Z471 Aftercare following joint replacement surgery: Secondary | ICD-10-CM | POA: Diagnosis not present

## 2020-06-22 DIAGNOSIS — I252 Old myocardial infarction: Secondary | ICD-10-CM | POA: Diagnosis not present

## 2020-06-22 DIAGNOSIS — Z96641 Presence of right artificial hip joint: Secondary | ICD-10-CM | POA: Diagnosis not present

## 2020-06-22 DIAGNOSIS — I959 Hypotension, unspecified: Secondary | ICD-10-CM | POA: Diagnosis not present

## 2020-06-22 DIAGNOSIS — I2510 Atherosclerotic heart disease of native coronary artery without angina pectoris: Secondary | ICD-10-CM | POA: Diagnosis not present

## 2020-06-22 DIAGNOSIS — M1612 Unilateral primary osteoarthritis, left hip: Secondary | ICD-10-CM | POA: Diagnosis not present

## 2020-06-22 DIAGNOSIS — Z87891 Personal history of nicotine dependence: Secondary | ICD-10-CM | POA: Diagnosis not present

## 2020-06-22 DIAGNOSIS — E7800 Pure hypercholesterolemia, unspecified: Secondary | ICD-10-CM | POA: Diagnosis not present

## 2020-06-23 ENCOUNTER — Inpatient Hospital Stay: Payer: PPO | Admitting: Orthopaedic Surgery

## 2020-06-24 ENCOUNTER — Telehealth: Payer: Self-pay

## 2020-06-24 ENCOUNTER — Other Ambulatory Visit: Payer: Self-pay | Admitting: Orthopaedic Surgery

## 2020-06-24 MED ORDER — HYDROCODONE-ACETAMINOPHEN 5-325 MG PO TABS: 1.0000 | ORAL_TABLET | ORAL | 0 refills | Status: DC | PRN

## 2020-06-24 NOTE — Telephone Encounter (Signed)
Patient called she is requesting rx refill for hydrocodone call back:[phone]

## 2020-06-24 NOTE — Telephone Encounter (Signed)
Please advise 

## 2020-06-30 ENCOUNTER — Encounter: Payer: Self-pay | Admitting: Orthopaedic Surgery

## 2020-06-30 ENCOUNTER — Ambulatory Visit (INDEPENDENT_AMBULATORY_CARE_PROVIDER_SITE_OTHER): Payer: PPO | Admitting: Orthopaedic Surgery

## 2020-06-30 DIAGNOSIS — Z96641 Presence of right artificial hip joint: Secondary | ICD-10-CM

## 2020-06-30 MED ORDER — HYDROCODONE-ACETAMINOPHEN 5-325 MG PO TABS: 1.0000 | ORAL_TABLET | Freq: Four times a day (QID) | ORAL | 0 refills | Status: DC | PRN

## 2020-06-30 NOTE — Progress Notes (Signed)
The patient comes in today 2 weeks tomorrow status post a right total hip arthroplasty.  She is doing very well overall.  She is mobilizing well.  She does have known severe end-stage arthritis of her left hip the right now is not really bothering her.  Examination right hip shows the incision looks great.  I remove the staples in place Steri-Strips.  There is no seroma.  Her knee is painful to her and explained why that is with when he did the surgery.  She does have some numbness around her incision to be expected as well.  I did refill her hydrocodone.  She is already using it sparingly but does need 1 more prescription.  She will finish home health therapy tomorrow and will increase her activities as comfort allows.  I like to see her back in 4 weeks to see how she is doing overall but no x-rays are needed.

## 2020-07-12 ENCOUNTER — Telehealth: Payer: Self-pay

## 2020-07-12 ENCOUNTER — Other Ambulatory Visit: Payer: Self-pay

## 2020-07-12 MED ORDER — AMOXICILLIN 500 MG PO TABS: ORAL_TABLET | ORAL | 0 refills | Status: DC

## 2020-07-12 NOTE — Telephone Encounter (Signed)
Patient called she would like to know if she needs antibiotics for her to receive dental cleaning call back:[phone]

## 2020-07-12 NOTE — Telephone Encounter (Signed)
LMOM for patient letting her know I called antibiotic for her

## 2020-07-14 ENCOUNTER — Other Ambulatory Visit: Payer: Self-pay | Admitting: Cardiovascular Disease

## 2020-07-28 ENCOUNTER — Encounter: Payer: Self-pay | Admitting: Orthopaedic Surgery

## 2020-07-28 ENCOUNTER — Ambulatory Visit (INDEPENDENT_AMBULATORY_CARE_PROVIDER_SITE_OTHER): Payer: PPO | Admitting: Orthopaedic Surgery

## 2020-07-28 DIAGNOSIS — Z96641 Presence of right artificial hip joint: Secondary | ICD-10-CM

## 2020-07-28 DIAGNOSIS — M2170 Unequal limb length (acquired), unspecified site: Secondary | ICD-10-CM

## 2020-07-28 MED ORDER — TRAMADOL HCL 50 MG PO TABS: 100.0000 mg | ORAL_TABLET | Freq: Four times a day (QID) | ORAL | 0 refills | Status: DC | PRN

## 2020-07-28 NOTE — Progress Notes (Signed)
The patient is now 6 weeks status post a right total hip arthroplasty.  The right hip is doing well.  There is some residual numbness.  She does have a leg length discrepancy with the right side longer than the left.  She has known severe arthritis in her left hip and hopes to have that replaced later in the year around October.  She is ambulate with a cane.  She is doing well overall.  She does need a refill of tramadol.  On examination laying her flat there is a leg length discrepancy with the right side certainly longer than left by several centimeters.  I gave her a prescription for having an insert for that left shoe temporarily.  I explained to get her leg lengths out to equal when she has a left hip replaced.  We will see her back in 5 months to see how she is doing overall.  At that visit I would like a standing low AP pelvis and lateral of her right operative hip.  At that point we will then work on getting her scheduled for her left hip replacement.

## 2020-08-09 ENCOUNTER — Other Ambulatory Visit: Payer: Self-pay | Admitting: *Deleted

## 2020-08-09 MED ORDER — LOVASTATIN 40 MG PO TABS: 40.0000 mg | ORAL_TABLET | Freq: Every day | ORAL | 3 refills | Status: DC

## 2020-09-01 ENCOUNTER — Other Ambulatory Visit: Payer: Self-pay | Admitting: Orthopaedic Surgery

## 2020-09-06 ENCOUNTER — Ambulatory Visit (INDEPENDENT_AMBULATORY_CARE_PROVIDER_SITE_OTHER): Payer: PPO | Admitting: Orthopaedic Surgery

## 2020-09-06 ENCOUNTER — Encounter: Payer: Self-pay | Admitting: Orthopaedic Surgery

## 2020-09-06 ENCOUNTER — Ambulatory Visit (INDEPENDENT_AMBULATORY_CARE_PROVIDER_SITE_OTHER): Payer: PPO

## 2020-09-06 DIAGNOSIS — Z96641 Presence of right artificial hip joint: Secondary | ICD-10-CM

## 2020-09-06 DIAGNOSIS — M109 Gout, unspecified: Secondary | ICD-10-CM | POA: Diagnosis not present

## 2020-09-06 DIAGNOSIS — M1612 Unilateral primary osteoarthritis, left hip: Secondary | ICD-10-CM

## 2020-09-06 DIAGNOSIS — M2170 Unequal limb length (acquired), unspecified site: Secondary | ICD-10-CM

## 2020-09-06 MED ORDER — METHYLPREDNISOLONE 4 MG PO TABS: ORAL_TABLET | ORAL | 0 refills | Status: DC

## 2020-09-06 NOTE — Progress Notes (Signed)
Office Visit Note   Patient: Angel Rivera           Date of Birth: [birth year]           MRN: [id number] Visit Date: 09/06/2020              Requested by: Lemmie Evens, MD [address],  [place] [address] PCP: Lemmie Evens, MD   Assessment & Plan: Visit Diagnoses:  1. History of right hip replacement   2. 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 Primary osteoarthritis of left hip   4. Leg length discrepancy     Plan: Given patient is on losartan would not place her on colchicine given the increased risk of myopathy.  Therefore we will place her on a Medrol Dosepak for 6 days.  In regards to her right hip we will have her follow-up at 1 year postop.  Left hip she would like to go ahead and schedule surgery for her.  Risk benefits of surgery she understands having just undergone a right total hip on 06/17/2020.  We will proceed with a left total hip arthroplasty in the near future.  Again her left hip is causing her severe pain and affecting her activities of daily living.  She has to use a cane to ambulate.  She also has a leg length discrepancy which we hope to correct at the time of the left total hip arthroplasty.  Questions were encouraged and answered at length.  Follow-Up Instructions: Return 2 weeks after left total hip arthroplasty.   Orders:  Orders Placed This Encounter  Procedures  . XR HIP UNILAT W OR W/O PELVIS 1V RIGHT  . XR Foot Complete Left   Meds ordered this encounter  Medications  . methylPREDNISolone (MEDROL) 4 MG tablet    Sig: Take as directed    Dispense:  21 tablet    Refill:  0      Procedures: No procedures performed   Clinical Data: No additional findings.   Subjective: Chief Complaint  Patient presents with  . Right Hip - Follow-up    HPI  Angel Rivera returns today follow-up right total hip arthroplasty which was performed 06/17/2020.  She states her right hip is doing well she has no complaints other  than the leg length discrepancy with the right leg being longer than the left.  She is having significant left hip pain.  She has known end-stage arthritis of the left hip.  She states this is severely affecting her gait.  She is having severe pain daily.  Ambulates with the use cane.  She has difficulty donning shoes and socks on the left foot.  She also unfortunately has developed left foot pain over the last day.  No injury.  She is notes swelling she notes is painful for anything to really touch the foot.  Pain involves the great toe region and the dorsal aspect left foot.  She does note some warmth to the area.  No history of gout.  She has had no fevers chills.  Review of Systems See HPI.  Objective: Vital Signs: There were no vitals taken for this visit.  Physical Exam Constitutional:      Appearance: She is not ill-appearing or diaphoretic.  Pulmonary:     Effort: Pulmonary effort is normal.  Neurological:     Mental Status: She is alert and oriented to person, place, and time.  Psychiatric:  Mood and Affect: Mood normal.     Ortho Exam Right hip excellent range of motion without pain.  Surgical incisions well-healed no signs of infections or wound dehiscence.  Left hip no attempts of motion today.  Leg length discrepancy with the right leg being approximately inch and a half longer than the left. Left foot tenderness at the first MP joint.  Slight erythema and slight warmth over the dorsal aspect of the foot.  There is no rashes skin lesions ulcerations otherwise.  She has tenderness to the touch in this area.  No tenderness over the posterior tibial tendon or the peroneal tendons of the left foot.  Dorsal pedal pulses 2+.  No gross deformity involving the right foot right foot is nontender throughout. Specialty Comments:  No specialty comments available.  Imaging: XR Foot Complete Left  Result Date: 09/06/2020 Left foot 3 views: No acute fractures.  Morton's type foot with  the second and third metatarsals being longer first.  Sesamoids well located.  No significant arthritic changes throughout the foot.  The first MP joint is well-maintained.  XR HIP UNILAT W OR W/O PELVIS 1V RIGHT  Result Date: 09/06/2020 AP pelvis lateral view right hip: Both hips are well located.  Status post right total hip arthroplasty with well-seated components.  No acute fracture of either hip.  End-stage arthritis left hip.    PMFS History: Patient Active Problem List   Diagnosis Date Noted  . 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  . Unilateral primary osteoarthritis, 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  . Unilateral primary osteoarthritis, right hip 03/23/2020  . Fracture, [place], left, closed 09/06/17 09/23/2017  . Diarrhea 05/16/2017  . Encounter for HCV screening test for high risk patient 05/16/2017  . Chest pain 06/17/2016  . Unstable angina ([place]) 12/30/2015  . HYPERCHOLESTEROLEMIA 09/09/2009  . MYOCARDIAL INFARCTION, 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  . CAD (coronary artery disease) 09/09/2009  . HYPOTENSION 09/09/2009   Past Medical History:  Diagnosis Date  . Arthritis   . Coronary artery disease    NATIVE VESSEL (ICD-414.01)-cath 08/17/09 with subtotally occluded RCA, moderate disease LAD.  Marland Kitchen Hyperlipidemia   . Hypotension   . Myocardial infarction ([place]) 08/17/2009  . Tobacco user     Family History  Problem Relation Age of Onset  . Heart attack Mother   . Coronary artery disease Other   . Heart disease Father        bypass  . Heart disease Brother        bypass    Past Surgical History:  Procedure Laterality Date  . ABDOMINAL HYSTERECTOMY  1980s   laparoscopic  . CARDIAC CATHETERIZATION N/A 12/30/2015   Procedure: Left Heart Cath and Coronary Angiography;  Surgeon: Burnell Blanks, MD;  Location: [facility];  Service: Cardiovascular;  Laterality: N/A;  . CARDIAC CATHETERIZATION N/A 12/30/2015   Procedure: Coronary Stent Intervention;  Surgeon:  Burnell Blanks, MD;  Location: [facility];  Service: Cardiovascular;  Laterality: N/A;  . CORONARY ANGIOPLASTY WITH STENT PLACEMENT  08/17/2009   "1 stent"  . TOTAL HIP ARTHROPLASTY Right 06/17/2020   Procedure: RIGHT TOTAL HIP ARTHROPLASTY ANTERIOR APPROACH;  Surgeon: Mcarthur Rossetti, MD;  Location: [facility];  Service: Orthopedics;  Laterality: Right;  . TUBAL LIGATION  ~ 1980   Social History   Occupational History  . Not on file  Tobacco Use  . Smoking status: Former Smoker    Packs/day: 0.50    Years: 33.00  Pack years: 16.50    Types: Cigarettes    Quit date: 05/07/2013    Years since quitting: 7.3  . Smokeless tobacco: Never Used  Vaping Use  . Vaping Use: Never used  Substance and Sexual Activity  . Alcohol use: Yes    Comment: 12/30/2015 "might have a drink once/year"  . Drug use: No  . Sexual activity: Not on file

## 2020-09-15 ENCOUNTER — Other Ambulatory Visit: Payer: Self-pay

## 2020-10-10 DIAGNOSIS — I2510 Atherosclerotic heart disease of native coronary artery without angina pectoris: Secondary | ICD-10-CM | POA: Diagnosis not present

## 2020-10-10 DIAGNOSIS — E785 Hyperlipidemia, unspecified: Secondary | ICD-10-CM | POA: Diagnosis not present

## 2020-10-10 DIAGNOSIS — M160 Bilateral primary osteoarthritis of hip: Secondary | ICD-10-CM | POA: Diagnosis not present

## 2020-10-10 DIAGNOSIS — G4700 Insomnia, unspecified: Secondary | ICD-10-CM | POA: Diagnosis not present

## 2020-10-12 ENCOUNTER — Other Ambulatory Visit: Payer: Self-pay | Admitting: Orthopaedic Surgery

## 2020-10-17 ENCOUNTER — Other Ambulatory Visit: Payer: Self-pay | Admitting: Physician Assistant

## 2020-10-17 ENCOUNTER — Telehealth: Payer: Self-pay

## 2020-10-17 NOTE — Patient Instructions (Addendum)
DUE TO COVID-19 ONLY ONE VISITOR IS ALLOWED TO COME WITH YOU AND STAY IN THE WAITING ROOM ONLY DURING PRE OP AND PROCEDURE DAY OF SURGERY. THE 1 VISITOR  MAY VISIT WITH YOU AFTER SURGERY IN YOUR PRIVATE ROOM DURING VISITING HOURS ONLY!  YOU NEED TO HAVE A COVID 19 TEST ON: 10/19/20@  2:30 PM, THIS TEST MUST BE DONE BEFORE SURGERY,  COVID TESTING SITE [place] [id number], IT IS ON THE RIGHT GOING OUT [address] APPROXIMATELY  2 MINUTES PAST [employer] ON THE RIGHT. ONCE YOUR COVID TEST IS COMPLETED,  PLEASE BEGIN THE QUARANTINE INSTRUCTIONS AS OUTLINED IN YOUR HANDOUT.                Angel Rivera   Your procedure is scheduled on: 10/21/20   Report to [facility] Main  Entrance   Report to short stay at : 5:15 AM     Call this number if you have problems the morning of surgery [phone]    Remember: NO SOLID FOOD AFTER MIDNIGHT THE NIGHT PRIOR TO SURGERY. NOTHING BY MOUTH EXCEPT CLEAR LIQUIDS UNTIL: 4:00 AM. PLEASE FINISH ENSURE DRINK PER SURGEON ORDER  WHICH NEEDS TO BE COMPLETED AT : 4:00 AM.   CLEAR LIQUID DIET  Foods Allowed                                                                     Foods Excluded  Coffee and tea, regular and decaf                             liquids that you cannot  Plain Jell-O any favor except red or purple                                           see through such as: Fruit ices (not with fruit pulp)                                     milk, soups, orange juice  Iced Popsicles                                    All solid food Carbonated beverages, regular and diet                                    Cranberry, grape and apple juices Sports drinks like Gatorade Lightly seasoned clear broth or consume(fat free) Sugar, honey syrup  Sample Menu Breakfast                                Lunch  Supper Cranberry juice                    Beef broth                             Chicken broth Jell-O                                     Grape juice                           Apple juice Coffee or tea                        Jell-O                                      Popsicle                                                Coffee or tea                        Coffee or tea  _____________________________________________________________________   BRUSH YOUR TEETH MORNING OF SURGERY AND RINSE YOUR MOUTH OUT, NO CHEWING GUM CANDY OR MINTS.    Take these medicines the morning of surgery with A SIP OF WATER: metoprolol.                               You may not have any metal on your body including hair pins and              piercings  Do not wear jewelry, make-up, lotions, powders or perfumes, deodorant             Do not wear nail polish on your fingernails.  Do not shave  48 hours prior to surgery.    Do not bring valuables to the hospital. [place].  Contacts, dentures or bridgework may not be worn into surgery.  Leave suitcase in the car. After surgery it may be brought to your room.     Patients discharged the day of surgery will not be allowed to drive home. IF YOU ARE HAVING SURGERY AND GOING HOME THE SAME DAY, YOU MUST HAVE AN ADULT TO DRIVE YOU HOME AND BE WITH YOU FOR 24 HOURS. YOU MAY GO HOME BY TAXI OR UBER OR ORTHERWISE, BUT AN ADULT MUST ACCOMPANY YOU HOME AND STAY WITH YOU FOR 24 HOURS.  Name and phone number of your driver:  Special Instructions: N/A              Please read over the following fact sheets you were given: _____________________________________________________________________           [facility] - Preparing for Surgery Before surgery, you can play an important role.  Because skin is not sterile, your skin needs to be as free of germs as possible.  You can reduce the number of germs on your skin by washing with CHG (chlorahexidine gluconate) soap before surgery.  CHG is an antiseptic cleaner  which kills germs and bonds with the skin to continue killing germs even after washing. Please DO NOT use if you have an allergy to CHG or antibacterial soaps.  If your skin becomes reddened/irritated stop using the CHG and inform your nurse when you arrive at Short Stay. Do not shave (including legs and underarms) for at least 48 hours prior to the first CHG shower.  You may shave your face/neck. Please follow these instructions carefully:  1.  Shower with CHG Soap the night before surgery and the  morning of Surgery.  2.  If you choose to wash your hair, wash your hair first as usual with your  normal  shampoo.  3.  After you shampoo, rinse your hair and body thoroughly to remove the  shampoo.                           4.  Use CHG as you would any other liquid soap.  You can apply chg directly  to the skin and wash                       Gently with a scrungie or clean washcloth.  5.  Apply the CHG Soap to your body ONLY FROM THE NECK DOWN.   Do not use on face/ open                           Wound or open sores. Avoid contact with eyes, ears mouth and genitals (private parts).                       Wash face,  Genitals (private parts) with your normal soap.             6.  Wash thoroughly, paying special attention to the area where your surgery  will be performed.  7.  Thoroughly rinse your body with warm water from the neck down.  8.  DO NOT shower/wash with your normal soap after using and rinsing off  the CHG Soap.                9.  Pat yourself dry with a clean towel.            10.  Wear clean pajamas.            11.  Place clean sheets on your bed the night of your first shower and do not  sleep with pets. Day of Surgery : Do not apply any lotions/deodorants the morning of surgery.  Please wear clean clothes to the hospital/surgery center.  FAILURE TO FOLLOW THESE INSTRUCTIONS MAY RESULT IN THE CANCELLATION OF YOUR SURGERY PATIENT SIGNATURE_________________________________  NURSE  SIGNATURE__________________________________  ________________________________________________________________________   Angel Rivera  An incentive spirometer is a tool that can help keep your lungs clear and active. This tool measures how well you are filling your lungs with each breath. Taking long deep breaths may help reverse or decrease the chance of developing breathing (pulmonary) problems (especially infection) following: A long period of time when you are unable to move or be active. BEFORE THE PROCEDURE  If the spirometer includes an indicator to show your best effort, your nurse or respiratory therapist will set it  to a desired goal. If possible, sit up straight or lean slightly forward. Try not to slouch. Hold the incentive spirometer in an upright position. INSTRUCTIONS FOR USE  Sit on the edge of your bed if possible, or sit up as far as you can in bed or on a chair. Hold the incentive spirometer in an upright position. Breathe out normally. Place the mouthpiece in your mouth and seal your lips tightly around it. Breathe in slowly and as deeply as possible, raising the piston or the ball toward the top of the column. Hold your breath for 3-5 seconds or for as long as possible. Allow the piston or ball to fall to the bottom of the column. Remove the mouthpiece from your mouth and breathe out normally. Rest for a few seconds and repeat Steps 1 through 7 at least 10 times every 1-2 hours when you are awake. Take your time and take a few normal breaths between deep breaths. The spirometer may include an indicator to show your best effort. Use the indicator as a goal to work toward during each repetition. After each set of 10 deep breaths, practice coughing to be sure your lungs are clear. If you have an incision (the cut made at the time of surgery), support your incision when coughing by placing a pillow or rolled up towels firmly against it. Once you are able to get out of  bed, walk around indoors and cough well. You may stop using the incentive spirometer when instructed by your caregiver.  RISKS AND COMPLICATIONS Take your time so you do not get dizzy or light-headed. If you are in pain, you may need to take or ask for pain medication before doing incentive spirometry. It is harder to take a deep breath if you are having pain. AFTER USE Rest and breathe slowly and easily. It can be helpful to keep track of a log of your progress. Your caregiver can provide you with a simple table to help with this. If you are using the spirometer at home, follow these instructions: [place] IF:  You are having difficultly using the spirometer. You have trouble using the spirometer as often as instructed. Your pain medication is not giving enough relief while using the spirometer. You develop fever of 100.5 F (38.1 C) or higher. SEEK IMMEDIATE MEDICAL CARE IF:  You cough up bloody sputum that had not been present before. You develop fever of 102 F (38.9 C) or greater. You develop worsening pain at or near the incision site. MAKE SURE YOU:  Understand these instructions. Will watch your condition. Will get help right away if you are not doing well or get worse. Document Released: 09/03/2006 Document Revised: 07/16/2011 Document Reviewed: 11/04/2006 [facility] Patient Information 2014 [place], [place].   ________________________________________________________________________

## 2020-10-18 ENCOUNTER — Other Ambulatory Visit (HOSPITAL_COMMUNITY): Payer: PPO

## 2020-10-19 ENCOUNTER — Other Ambulatory Visit (HOSPITAL_COMMUNITY)
Admission: RE | Admit: 2020-10-19 | Discharge: 2020-10-19 | Disposition: A | Discharge status: Home / Self Care | Payer: PPO | Source: Ambulatory Visit | Attending: Orthopaedic Surgery | Admitting: Orthopaedic Surgery

## 2020-10-19 ENCOUNTER — Encounter (HOSPITAL_COMMUNITY)
Admission: RE | Admit: 2020-10-19 | Discharge: 2020-10-19 | Disposition: A | Discharge status: Home / Self Care | Payer: PPO | Source: Ambulatory Visit | Attending: Orthopaedic Surgery | Admitting: Orthopaedic Surgery

## 2020-10-19 ENCOUNTER — Other Ambulatory Visit: Payer: Self-pay

## 2020-10-19 ENCOUNTER — Encounter (HOSPITAL_COMMUNITY): Payer: Self-pay

## 2020-10-19 DIAGNOSIS — Z20822 Contact with and (suspected) exposure to covid-19: Secondary | ICD-10-CM | POA: Diagnosis not present

## 2020-10-19 DIAGNOSIS — Z01812 Encounter for preprocedural laboratory examination: Secondary | ICD-10-CM | POA: Insufficient documentation

## 2020-10-19 HISTORY — DX: Essential (primary) hypertension: I10

## 2020-10-19 LAB — BASIC METABOLIC PANEL
Anion gap: 6 (ref 5–15)
BUN: 16 mg/dL (ref 8–23)
CO2: 26 mmol/L (ref 22–32)
Calcium: 9.3 mg/dL (ref 8.9–10.3)
Chloride: 111 mmol/L (ref 98–111)
Creatinine, Ser: 0.9 mg/dL (ref 0.44–1.00)
GFR, Estimated: >60 mL/min (ref >60)
Glucose, Bld: 100 mg/dL — ABNORMAL HIGH (ref 70–99)
Potassium: 4.4 mmol/L (ref 3.5–5.1)
Sodium: 143 mmol/L (ref 135–145)

## 2020-10-19 LAB — SARS CORONAVIRUS 2 (TAT 6-24 HRS): SARS Coronavirus 2: NEGATIVE

## 2020-10-19 LAB — CBC
HCT: 38.8 % (ref 36.0–46.0)
Hemoglobin: 12.6 g/dL (ref 12.0–15.0)
MCH: 31 pg (ref 26.0–34.0)
MCHC: 32.5 g/dL (ref 30.0–36.0)
MCV: 95.6 fL (ref 80.0–100.0)
Platelets: 295 10*3/uL (ref 150–400)
RBC: 4.06 MIL/uL (ref 3.87–5.11)
RDW: 13.2 % (ref 11.5–15.5)
WBC: 10.1 10*3/uL (ref 4.0–10.5)
nRBC: 0 % (ref 0.0–0.2)

## 2020-10-19 LAB — SURGICAL PCR SCREEN
MRSA, PCR: NEGATIVE
Staphylococcus aureus: POSITIVE — AB

## 2020-10-19 NOTE — Progress Notes (Signed)
Anesthesia Chart Review   Case: [id number] Date/Time: 10/21/20 0700   Procedure: LEFT TOTAL HIP ARTHROPLASTY ANTERIOR APPROACH (Left: Hip)   Anesthesia type: Choice   Pre-op diagnosis: Osteoarthritis Left Hip   Location: [facility] ROOM 09 / [facility]   Surgeons: Mcarthur Rossetti, MD       DISCUSSION:69 y.o. former smoker with h/o HTN, CAD (DES 2011, DES 2017), left hip OA scheduled for above procedure 10/21/2020 with Dr. Jean Rosenthal.   S/p right total hip 06/17/20 with no anesthesia complications.   Prior to this cleared by cardio. Per 05/09/2020 OV note, "Pre-operative cardiovascular examination: She has no angina, signs of heart failure or arrhythmias. She can proceed with her planned hip replacement surgery."  Anticipate pt can proceed with planned procedure barring acute status change.   VS: BP 136/71   Pulse 72   Temp 36.9 C (Oral)   Ht 5\' 3"  (1.6 m)   Wt 63.5 kg   SpO2 100%   BMI 24.80 kg/m   PROVIDERS: Lemmie Evens, MD is PCP   Lauree Chandler, MD is Cardiologist  LABS: Labs reviewed: Acceptable for surgery. (all labs ordered are listed, but only abnormal results are displayed)  Labs Reviewed  SURGICAL PCR SCREEN  BASIC METABOLIC PANEL  CBC  TYPE AND SCREEN     IMAGES:   EKG: 05/09/2020 Rate 81 bpm  Sinus rhythm   CV: Echo 08/18/2009  Study Conclusions    - Left ventricle: The cavity size was normal. Wall thickness was     normal. Systolic function was mildly reduced. The estimated     ejection fraction was in the range of 45% to 50%. Possible     hypokinesis of the inferoposterior myocardium.   - Mitral valve: Mild regurgitation.   - Left atrium: The atrium was mildly dilated.   Impressions:    - Technically difficult; endocardium not well visualized; suggest     TEE or MUGA if clinically indicated. Past Medical History:  Diagnosis Date   Arthritis    Coronary artery disease    NATIVE VESSEL (ICD-414.01)-cath 08/17/09 with subtotally  occluded RCA, moderate disease LAD.   Hyperlipidemia    Hypertension    Hypotension    Myocardial infarction ([place]) 08/17/2009   Tobacco user     Past Surgical History:  Procedure Laterality Date   ABDOMINAL HYSTERECTOMY  1980s   laparoscopic   CARDIAC CATHETERIZATION N/A 12/30/2015   Procedure: Left Heart Cath and Coronary Angiography;  Surgeon: Burnell Blanks, MD;  Location: [facility];  Service: Cardiovascular;  Laterality: N/A;   CARDIAC CATHETERIZATION N/A 12/30/2015   Procedure: Coronary Stent Intervention;  Surgeon: Burnell Blanks, MD;  Location: [facility];  Service: Cardiovascular;  Laterality: N/A;   CORONARY ANGIOPLASTY WITH STENT PLACEMENT  08/17/2009   "1 stent"   TOTAL HIP ARTHROPLASTY Right 06/17/2020   Procedure: RIGHT TOTAL HIP ARTHROPLASTY ANTERIOR APPROACH;  Surgeon: Mcarthur Rossetti, MD;  Location: [facility];  Service: Orthopedics;  Laterality: Right;   TUBAL LIGATION  ~ 1980    MEDICATIONS:  amoxicillin (AMOXIL) 500 MG tablet   ascorbic acid (VITAMIN C) 500 MG tablet   aspirin 81 MG chewable tablet   B Complex-C (B-COMPLEX WITH VITAMIN C) tablet   Biotin 5000 MCG TABS   cholecalciferol (VITAMIN D) 25 MCG (1000 UNIT) tablet   furosemide (LASIX) 40 MG tablet   LORazepam (ATIVAN) 0.5 MG tablet   lovastatin (MEVACOR) 40 MG tablet   methocarbamol (ROBAXIN) 500  MG tablet   methylPREDNISolone (MEDROL) 4 MG tablet   metoprolol succinate (TOPROL-XL) 25 MG 24 hr tablet   milk thistle 175 MG tablet   traMADol (ULTRAM) 50 MG tablet   No current facility-administered medications for this encounter.

## 2020-10-19 NOTE — Progress Notes (Signed)
COVID Vaccine Completed: Yes Date COVID Vaccine completed: 07/2019 COVID vaccine manufacturer: Moderna     PCP - Dr. Lemmie Evens Cardiologist - Dr. Lauree Chandler. LOV: 05/09/20  Chest x-ray -  EKG - 05/09/20 Stress Test -  ECHO -  Cardiac Cath - 12/30/15 Pacemaker/ICD device last checked:  Sleep Study -  CPAP -   Fasting Blood Sugar -  Checks Blood Sugar _____ times a day  Blood Thinner Instructions: Aspirin Instructions: Last Dose:  Anesthesia review: Hx: HTN,MI,CAD  Patient denies shortness of breath, fever, cough and chest pain at PAT appointment   Patient verbalized understanding of instructions that were given to them at the PAT appointment. Patient was also instructed that they will need to review over the PAT instructions again at home before surgery.

## 2020-10-20 NOTE — Progress Notes (Signed)
PCR:  positive STAPH 

## 2020-10-20 NOTE — Anesthesia Preprocedure Evaluation (Addendum)
Anesthesia Evaluation  Patient identified by MRN, date of birth, ID band Patient awake    Reviewed: Allergy & Precautions, NPO status , Patient's Chart, lab work & pertinent test results, reviewed documented beta blocker date and time   Airway Mallampati: III  TM Distance: >3 FB Neck ROM: Full    Dental no notable dental hx. (+) Teeth Intact, Dental Advisory Given   Pulmonary neg pulmonary ROS, former smoker,    Pulmonary exam normal breath sounds clear to auscultation       Cardiovascular hypertension, Pt. on home beta blockers and Pt. on medications + angina + CAD, + Past MI and + Cardiac Stents  Normal cardiovascular exam Rhythm:Regular Rate:Normal  LHC 2017 Mid RCA to Dist RCA lesion, 5 %stenosed. A STENT RESOLUTE INTEG 2.5X22 drug eluting stent was successfully placed. Mid RCA lesion, 80 %stenosed. Post intervention, there is a 0% residual stenosis. Mid Cx lesion, 50 %stenosed. Mid LAD lesion, 50 %stenosed. Dist LAD lesion, 30 %stenosed. The left ventricular systolic function is normal. LV end diastolic pressure is normal. The left ventricular ejection fraction is 50-55% by visual estimate. There is no mitral valve regurgitation.     Neuro/Psych PSYCHIATRIC DISORDERS Anxiety negative neurological ROS     GI/Hepatic negative GI ROS, Neg liver ROS,   Endo/Other  negative endocrine ROS  Renal/GU negative Renal ROS  negative genitourinary   Musculoskeletal  (+) Arthritis ,   Abdominal   Peds  Hematology negative hematology ROS (+)   Anesthesia Other Findings   Reproductive/Obstetrics                            Anesthesia Physical Anesthesia Plan  ASA: 3  Anesthesia Plan: Spinal   Post-op Pain Management:    Induction:   PONV Risk Score and Plan: 2 and Treatment may vary due to age or medical condition, Propofol infusion, Ondansetron, Dexamethasone and Midazolam  Airway  Management Planned: Natural Airway  Additional Equipment:   Intra-op Plan:   Post-operative Plan:   Informed Consent: I have reviewed the patients History and Physical, chart, labs and discussed the procedure including the risks, benefits and alternatives for the proposed anesthesia with the patient or authorized representative who has indicated his/her understanding and acceptance.     Dental advisory given  Plan Discussed with: CRNA  Anesthesia Plan Comments:        Anesthesia Quick Evaluation

## 2020-10-21 ENCOUNTER — Ambulatory Visit (HOSPITAL_COMMUNITY): Payer: PPO

## 2020-10-21 ENCOUNTER — Ambulatory Visit (HOSPITAL_COMMUNITY): Payer: PPO | Admitting: Physician Assistant

## 2020-10-21 ENCOUNTER — Ambulatory Visit (HOSPITAL_COMMUNITY): Payer: PPO | Admitting: Anesthesiology

## 2020-10-21 ENCOUNTER — Other Ambulatory Visit: Payer: Self-pay

## 2020-10-21 ENCOUNTER — Encounter (HOSPITAL_COMMUNITY)
Admission: RE | Disposition: A | Discharge status: Home / Self Care | Payer: Self-pay | Source: Ambulatory Visit | Attending: Orthopaedic Surgery

## 2020-10-21 ENCOUNTER — Encounter (HOSPITAL_COMMUNITY): Payer: Self-pay | Admitting: Orthopaedic Surgery

## 2020-10-21 ENCOUNTER — Observation Stay (HOSPITAL_COMMUNITY)
Admission: RE | Admit: 2020-10-21 | Discharge: 2020-10-22 | Disposition: A | Discharge status: Home / Self Care | Payer: PPO | Source: Ambulatory Visit | Attending: Orthopaedic Surgery | Admitting: Orthopaedic Surgery

## 2020-10-21 DIAGNOSIS — Z87891 Personal history of nicotine dependence: Secondary | ICD-10-CM | POA: Diagnosis not present

## 2020-10-21 DIAGNOSIS — M1612 Unilateral primary osteoarthritis, left hip: Principal | ICD-10-CM | POA: Insufficient documentation

## 2020-10-21 DIAGNOSIS — I2510 Atherosclerotic heart disease of native coronary artery without angina pectoris: Secondary | ICD-10-CM | POA: Diagnosis not present

## 2020-10-21 DIAGNOSIS — Z955 Presence of coronary angioplasty implant and graft: Secondary | ICD-10-CM | POA: Diagnosis not present

## 2020-10-21 DIAGNOSIS — I252 Old myocardial infarction: Secondary | ICD-10-CM | POA: Insufficient documentation

## 2020-10-21 DIAGNOSIS — E7800 Pure hypercholesterolemia, unspecified: Secondary | ICD-10-CM | POA: Diagnosis not present

## 2020-10-21 DIAGNOSIS — Z96643 Presence of artificial hip joint, bilateral: Secondary | ICD-10-CM | POA: Diagnosis not present

## 2020-10-21 DIAGNOSIS — Z96642 Presence of left artificial hip joint: Secondary | ICD-10-CM | POA: Diagnosis not present

## 2020-10-21 DIAGNOSIS — I10 Essential (primary) hypertension: Secondary | ICD-10-CM | POA: Insufficient documentation

## 2020-10-21 DIAGNOSIS — I25110 Atherosclerotic heart disease of native coronary artery with unstable angina pectoris: Secondary | ICD-10-CM | POA: Diagnosis not present

## 2020-10-21 DIAGNOSIS — Z471 Aftercare following joint replacement surgery: Secondary | ICD-10-CM | POA: Diagnosis not present

## 2020-10-21 DIAGNOSIS — Z96649 Presence of unspecified artificial hip joint: Secondary | ICD-10-CM

## 2020-10-21 DIAGNOSIS — M160 Bilateral primary osteoarthritis of hip: Secondary | ICD-10-CM | POA: Diagnosis not present

## 2020-10-21 HISTORY — PX: TOTAL HIP ARTHROPLASTY: SHX124

## 2020-10-21 LAB — TYPE AND SCREEN
ABO/RH(D): O POS
Antibody Screen: NEGATIVE

## 2020-10-21 SURGERY — ARTHROPLASTY, HIP, TOTAL, ANTERIOR APPROACH
Anesthesia: Spinal | Site: Hip | Laterality: Left

## 2020-10-21 MED ORDER — CHLORHEXIDINE GLUCONATE 0.12 % MT SOLN
15.0000 mL | Freq: Once | OROMUCOSAL | Status: AC
  Administered 2020-10-21: 15 mL via OROMUCOSAL

## 2020-10-21 MED ORDER — PHENOL 1.4 % MT LIQD: 1.0000 | OROMUCOSAL | Status: DC | PRN

## 2020-10-21 MED ORDER — BUPIVACAINE IN DEXTROSE 0.75-8.25 % IT SOLN
INTRATHECAL | Status: DC | PRN
  Administered 2020-10-21: 1.8 mL via INTRATHECAL

## 2020-10-21 MED ORDER — FENTANYL CITRATE (PF) 100 MCG/2ML IJ SOLN
INTRAMUSCULAR | Status: AC
  Administered 2020-10-21: 25 ug via INTRAVENOUS
  Filled 2020-10-21: qty 2

## 2020-10-21 MED ORDER — ONDANSETRON HCL 4 MG/2ML IJ SOLN: 4.0000 mg | Freq: Four times a day (QID) | INTRAMUSCULAR | Status: DC | PRN

## 2020-10-21 MED ORDER — METHOCARBAMOL 500 MG IVPB - SIMPLE MED
INTRAVENOUS | Status: AC
  Administered 2020-10-21: 500 mg via INTRAVENOUS
  Filled 2020-10-21: qty 50

## 2020-10-21 MED ORDER — DEXAMETHASONE SODIUM PHOSPHATE 10 MG/ML IJ SOLN
INTRAMUSCULAR | Status: DC | PRN
  Administered 2020-10-21: 10 mg via INTRAVENOUS

## 2020-10-21 MED ORDER — LIDOCAINE HCL (CARDIAC) PF 100 MG/5ML IV SOSY
PREFILLED_SYRINGE | INTRAVENOUS | Status: DC | PRN
  Administered 2020-10-21: 60 mg via INTRAVENOUS

## 2020-10-21 MED ORDER — ACETAMINOPHEN 500 MG PO TABS
1000.0000 mg | ORAL_TABLET | Freq: Once | ORAL | Status: AC
  Administered 2020-10-21: 1000 mg via ORAL
  Filled 2020-10-21: qty 2

## 2020-10-21 MED ORDER — B COMPLEX-C PO TABS
1.0000 | ORAL_TABLET | Freq: Every day | ORAL | Status: DC
  Administered 2020-10-22: 1 via ORAL
  Filled 2020-10-21: qty 1

## 2020-10-21 MED ORDER — DIPHENHYDRAMINE HCL 12.5 MG/5ML PO ELIX: 12.5000 mg | ORAL_SOLUTION | ORAL | Status: DC | PRN

## 2020-10-21 MED ORDER — MENTHOL 3 MG MT LOZG: 1.0000 | LOZENGE | OROMUCOSAL | Status: DC | PRN

## 2020-10-21 MED ORDER — PROPOFOL 500 MG/50ML IV EMUL
INTRAVENOUS | Status: DC | PRN
  Administered 2020-10-21: 100 ug/kg/min via INTRAVENOUS

## 2020-10-21 MED ORDER — SODIUM CHLORIDE 0.9 % IR SOLN
Status: DC | PRN
  Administered 2020-10-21: 1000 mL

## 2020-10-21 MED ORDER — PHENYLEPHRINE HCL-NACL 10-0.9 MG/250ML-% IV SOLN
INTRAVENOUS | Status: DC | PRN
  Administered 2020-10-21: 25 ug/min via INTRAVENOUS

## 2020-10-21 MED ORDER — BIOTIN 5000 MCG PO TABS: 5000.0000 ug | ORAL_TABLET | Freq: Every day | ORAL | Status: DC

## 2020-10-21 MED ORDER — ONDANSETRON HCL 4 MG/2ML IJ SOLN
INTRAMUSCULAR | Status: AC
  Filled 2020-10-21: qty 2

## 2020-10-21 MED ORDER — MIDAZOLAM HCL 2 MG/2ML IJ SOLN
INTRAMUSCULAR | Status: AC
  Filled 2020-10-21: qty 2

## 2020-10-21 MED ORDER — LORAZEPAM 0.5 MG PO TABS: 0.5000 mg | ORAL_TABLET | Freq: Every evening | ORAL | Status: DC | PRN

## 2020-10-21 MED ORDER — METOPROLOL SUCCINATE ER 25 MG PO TB24
12.5000 mg | ORAL_TABLET | Freq: Every day | ORAL | Status: DC
  Administered 2020-10-22: 12.5 mg via ORAL
  Filled 2020-10-21: qty 1

## 2020-10-21 MED ORDER — FENTANYL CITRATE (PF) 100 MCG/2ML IJ SOLN
25.0000 ug | INTRAMUSCULAR | Status: DC | PRN
  Administered 2020-10-21: 25 ug via INTRAVENOUS
  Administered 2020-10-21: 50 ug via INTRAVENOUS

## 2020-10-21 MED ORDER — ONDANSETRON HCL 4 MG/2ML IJ SOLN
INTRAMUSCULAR | Status: DC | PRN
  Administered 2020-10-21: 4 mg via INTRAVENOUS

## 2020-10-21 MED ORDER — MIDAZOLAM HCL 5 MG/5ML IJ SOLN
INTRAMUSCULAR | Status: DC | PRN
  Administered 2020-10-21: 2 mg via INTRAVENOUS

## 2020-10-21 MED ORDER — LACTATED RINGERS IV SOLN: INTRAVENOUS | Status: DC

## 2020-10-21 MED ORDER — 0.9 % SODIUM CHLORIDE (POUR BTL) OPTIME
TOPICAL | Status: DC | PRN
  Administered 2020-10-21: 1000 mL

## 2020-10-21 MED ORDER — TRANEXAMIC ACID-NACL 1000-0.7 MG/100ML-% IV SOLN
1000.0000 mg | INTRAVENOUS | Status: AC
  Administered 2020-10-21: 1000 mg via INTRAVENOUS
  Filled 2020-10-21: qty 100

## 2020-10-21 MED ORDER — METOCLOPRAMIDE HCL 5 MG/ML IJ SOLN: 5.0000 mg | Freq: Three times a day (TID) | INTRAMUSCULAR | Status: DC | PRN

## 2020-10-21 MED ORDER — LIDOCAINE 2% (20 MG/ML) 5 ML SYRINGE
INTRAMUSCULAR | Status: AC
  Filled 2020-10-21: qty 5

## 2020-10-21 MED ORDER — MORPHINE SULFATE (PF) 2 MG/ML IV SOLN: 0.5000 mg | INTRAVENOUS | Status: DC | PRN

## 2020-10-21 MED ORDER — VITAMIN D3 25 MCG (1000 UNIT) PO TABS: 1000.0000 [IU] | ORAL_TABLET | Freq: Every day | ORAL | Status: DC

## 2020-10-21 MED ORDER — ASCORBIC ACID 500 MG PO TABS
500.0000 mg | ORAL_TABLET | Freq: Every day | ORAL | Status: DC
  Administered 2020-10-21 – 2020-10-22 (×2): 500 mg via ORAL
  Filled 2020-10-21 (×2): qty 1

## 2020-10-21 MED ORDER — PROPOFOL 1000 MG/100ML IV EMUL
INTRAVENOUS | Status: AC
  Filled 2020-10-21: qty 200

## 2020-10-21 MED ORDER — DEXAMETHASONE SODIUM PHOSPHATE 10 MG/ML IJ SOLN
INTRAMUSCULAR | Status: AC
  Filled 2020-10-21: qty 1

## 2020-10-21 MED ORDER — ALUM & MAG HYDROXIDE-SIMETH 200-200-20 MG/5ML PO SUSP: 30.0000 mL | ORAL | Status: DC | PRN

## 2020-10-21 MED ORDER — ONDANSETRON HCL 4 MG PO TABS: 4.0000 mg | ORAL_TABLET | Freq: Four times a day (QID) | ORAL | Status: DC | PRN

## 2020-10-21 MED ORDER — CEFAZOLIN SODIUM-DEXTROSE 2-4 GM/100ML-% IV SOLN
2.0000 g | INTRAVENOUS | Status: AC
  Administered 2020-10-21: 2 g via INTRAVENOUS
  Filled 2020-10-21: qty 100

## 2020-10-21 MED ORDER — PANTOPRAZOLE SODIUM 40 MG PO TBEC
40.0000 mg | DELAYED_RELEASE_TABLET | Freq: Every day | ORAL | Status: DC
  Administered 2020-10-22: 40 mg via ORAL
  Filled 2020-10-21: qty 1

## 2020-10-21 MED ORDER — POVIDONE-IODINE 10 % EX SWAB
2.0000 "application " | Freq: Once | CUTANEOUS | Status: AC
  Administered 2020-10-21: 2 via TOPICAL

## 2020-10-21 MED ORDER — METHOCARBAMOL 500 MG PO TABS
500.0000 mg | ORAL_TABLET | Freq: Four times a day (QID) | ORAL | Status: DC | PRN
  Administered 2020-10-21 – 2020-10-22 (×2): 500 mg via ORAL
  Filled 2020-10-21 (×2): qty 1

## 2020-10-21 MED ORDER — METHOCARBAMOL 500 MG IVPB - SIMPLE MED
500.0000 mg | Freq: Four times a day (QID) | INTRAVENOUS | Status: DC | PRN
  Filled 2020-10-21: qty 50

## 2020-10-21 MED ORDER — POLYETHYLENE GLYCOL 3350 17 G PO PACK: 17.0000 g | PACK | Freq: Every day | ORAL | Status: DC | PRN

## 2020-10-21 MED ORDER — ASPIRIN 81 MG PO CHEW
81.0000 mg | CHEWABLE_TABLET | Freq: Two times a day (BID) | ORAL | Status: DC
  Administered 2020-10-21 – 2020-10-22 (×2): 81 mg via ORAL
  Filled 2020-10-21 (×2): qty 1

## 2020-10-21 MED ORDER — ACETAMINOPHEN 325 MG PO TABS: 325.0000 mg | ORAL_TABLET | Freq: Four times a day (QID) | ORAL | Status: DC | PRN

## 2020-10-21 MED ORDER — HYDROCODONE-ACETAMINOPHEN 5-325 MG PO TABS
1.0000 | ORAL_TABLET | ORAL | Status: DC | PRN
  Administered 2020-10-21 – 2020-10-22 (×5): 2 via ORAL
  Filled 2020-10-21 (×5): qty 2

## 2020-10-21 MED ORDER — HYDROCODONE-ACETAMINOPHEN 7.5-325 MG PO TABS: 1.0000 | ORAL_TABLET | ORAL | Status: DC | PRN

## 2020-10-21 MED ORDER — VITAMIN D 25 MCG (1000 UNIT) PO TABS
1000.0000 [IU] | ORAL_TABLET | Freq: Every day | ORAL | Status: DC
  Administered 2020-10-21 – 2020-10-22 (×2): 1000 [IU] via ORAL
  Filled 2020-10-21 (×2): qty 1

## 2020-10-21 MED ORDER — ORAL CARE MOUTH RINSE: 15.0000 mL | Freq: Once | OROMUCOSAL | Status: AC

## 2020-10-21 MED ORDER — PROPOFOL 10 MG/ML IV BOLUS
INTRAVENOUS | Status: DC | PRN
  Administered 2020-10-21: 20 mg via INTRAVENOUS

## 2020-10-21 MED ORDER — STERILE WATER FOR IRRIGATION IR SOLN
Status: DC | PRN
  Administered 2020-10-21 (×2): 1000 mL

## 2020-10-21 MED ORDER — METOCLOPRAMIDE HCL 5 MG PO TABS: 5.0000 mg | ORAL_TABLET | Freq: Three times a day (TID) | ORAL | Status: DC | PRN

## 2020-10-21 MED ORDER — CEFAZOLIN SODIUM-DEXTROSE 1-4 GM/50ML-% IV SOLN
1.0000 g | Freq: Four times a day (QID) | INTRAVENOUS | Status: AC
  Administered 2020-10-21 (×2): 1 g via INTRAVENOUS
  Filled 2020-10-21 (×2): qty 50

## 2020-10-21 MED ORDER — DOCUSATE SODIUM 100 MG PO CAPS
100.0000 mg | ORAL_CAPSULE | Freq: Two times a day (BID) | ORAL | Status: DC
  Administered 2020-10-21 – 2020-10-22 (×2): 100 mg via ORAL
  Filled 2020-10-21 (×2): qty 1

## 2020-10-21 MED ORDER — SODIUM CHLORIDE 0.9 % IV SOLN: INTRAVENOUS | Status: DC

## 2020-10-21 SURGICAL SUPPLY — 40 items
BAG ZIPLOCK 12X15 (MISCELLANEOUS) IMPLANT
BENZOIN TINCTURE PRP APPL 2/3 (GAUZE/BANDAGES/DRESSINGS) IMPLANT
BLADE SAW SGTL 18X1.27X75 (BLADE) ×2 IMPLANT
BLADE SAW SGTL 18X1.27X75MM (BLADE) ×1
CLOSURE WOUND 1/2 X4 (GAUZE/BANDAGES/DRESSINGS)
COVER PERINEAL POST (MISCELLANEOUS) ×3 IMPLANT
COVER SURGICAL LIGHT HANDLE (MISCELLANEOUS) ×3 IMPLANT
COVER WAND RF STERILE (DRAPES) ×3 IMPLANT
CUP SECTOR GRIPTON 50MM (Cup) ×3 IMPLANT
DRAPE FOOT SWITCH (DRAPES) ×3 IMPLANT
DRAPE STERI IOBAN 125X83 (DRAPES) ×3 IMPLANT
DRAPE U-SHAPE 47X51 STRL (DRAPES) ×6 IMPLANT
DRSG AQUACEL AG ADV 3.5X10 (GAUZE/BANDAGES/DRESSINGS) ×3 IMPLANT
DURAPREP 26ML APPLICATOR (WOUND CARE) ×3 IMPLANT
ELECT REM PT RETURN 15FT ADLT (MISCELLANEOUS) ×3 IMPLANT
GAUZE XEROFORM 1X8 LF (GAUZE/BANDAGES/DRESSINGS) ×3 IMPLANT
GLOVE SRG 8 PF TXTR STRL LF DI (GLOVE) ×2 IMPLANT
GLOVE SURG ENC MOIS LTX SZ7.5 (GLOVE) ×3 IMPLANT
GLOVE SURG LTX SZ8 (GLOVE) ×3 IMPLANT
GLOVE SURG UNDER POLY LF SZ8 (GLOVE) ×4
GOWN STRL REUS W/TWL XL LVL3 (GOWN DISPOSABLE) ×6 IMPLANT
HANDPIECE INTERPULSE COAX TIP (DISPOSABLE) ×2
HEAD FEM STD 32X+9 STRL (Hips) ×3 IMPLANT
HOLDER FOLEY CATH W/STRAP (MISCELLANEOUS) ×3 IMPLANT
KIT TURNOVER KIT A (KITS) ×3 IMPLANT
LINER ACETABULAR 32X50 (Liner) ×3 IMPLANT
PACK ANTERIOR HIP CUSTOM (KITS) ×3 IMPLANT
PENCIL SMOKE EVACUATOR (MISCELLANEOUS) IMPLANT
SET HNDPC FAN SPRY TIP SCT (DISPOSABLE) ×1 IMPLANT
STAPLER VISISTAT 35W (STAPLE) IMPLANT
STEM CORAIL KA12 (Stem) ×3 IMPLANT
STRIP CLOSURE SKIN 1/2X4 (GAUZE/BANDAGES/DRESSINGS) IMPLANT
SUT ETHIBOND NAB CT1 #1 30IN (SUTURE) ×3 IMPLANT
SUT ETHILON 2 0 PS N (SUTURE) IMPLANT
SUT MNCRL AB 4-0 PS2 18 (SUTURE) IMPLANT
SUT VIC AB 0 CT1 36 (SUTURE) ×3 IMPLANT
SUT VIC AB 1 CT1 36 (SUTURE) ×3 IMPLANT
SUT VIC AB 2-0 CT1 27 (SUTURE) ×4
SUT VIC AB 2-0 CT1 TAPERPNT 27 (SUTURE) ×2 IMPLANT
TRAY FOLEY MTR SLVR 16FR STAT (SET/KITS/TRAYS/PACK) IMPLANT

## 2020-10-21 NOTE — Anesthesia Postprocedure Evaluation (Signed)
Anesthesia Post Note  Patient: Angel Rivera  Procedure(s) Performed: LEFT TOTAL HIP ARTHROPLASTY ANTERIOR APPROACH (Left: Hip)     Patient location during evaluation: PACU Anesthesia Type: Spinal Level of consciousness: oriented and awake and alert Pain management: pain level controlled Vital Signs Assessment: post-procedure vital signs reviewed and stable Respiratory status: spontaneous breathing, respiratory function stable and patient connected to nasal cannula oxygen Cardiovascular status: blood pressure returned to baseline and stable Postop Assessment: no headache, no backache and no apparent nausea or vomiting Anesthetic complications: no   No notable events documented.  Last Vitals:  Vitals:   10/21/20 0945 10/21/20 1010  BP: (!) 147/93 (!) 150/77  Pulse: 68 69  Resp: 16 16  Temp:  36.5 C  SpO2: 100% 100%    Last Pain:  Vitals:   10/21/20 0945  TempSrc:   PainSc: 5                  Antoney Biven L Jaydah Stahle

## 2020-10-21 NOTE — Transfer of Care (Signed)
Immediate Anesthesia Transfer of Care Note  Patient: Angel Rivera  Procedure(s) Performed: LEFT TOTAL HIP ARTHROPLASTY ANTERIOR APPROACH (Left: Hip)  Patient Location: PACU  Anesthesia Type:Spinal  Level of Consciousness: oriented, drowsy and patient cooperative  Airway & Oxygen Therapy: Patient Spontanous Breathing and Patient connected to face mask oxygen  Post-op Assessment: Report given to RN and Post -op Vital signs reviewed and stable  Post vital signs: Reviewed and stable  Last Vitals:  Vitals Value Taken Time  BP 127/60 10/21/20 0848  Temp    Pulse 71 10/21/20 0850  Resp 18 10/21/20 0850  SpO2 100 % 10/21/20 0850  Vitals shown include unvalidated device data.  Last Pain:  Vitals:   10/21/20 0553  TempSrc:   PainSc: 3       Patients Stated Pain Goal: 5 (91/44/45 8483)  Complications: No notable events documented.

## 2020-10-21 NOTE — Evaluation (Signed)
Physical Therapy Evaluation Patient Details Name: Angel Rivera MRN: [id number] DOB: 1950/09/09 Today's Date: 10/21/2020   History of Present Illness  Patient is 70 y.o. female s/p Lt THA anterior approach on 10/21/20 with PMH significant for HTN, HLD, CAD, MI in 2011 s/p stent, OA, Rt THA  on 06/17/20.    Clinical Impression  Angel Rivera is a 70 y.o. female POD 0 s/p Lt THA. Patient reports independence with mobility at baseline. Patient is now limited by functional impairments (see PT problem list below) and requires min guard/supervision for transfers and gait with RW. Patient was able to ambulate ~200 feet with RW and min guard/supervision. Patient will benefit from continued skilled PT interventions to address impairments and progress towards PLOF. Acute PT will follow to progress mobility and stair training in preparation for safe discharge home.     Follow Up Recommendations Follow surgeon's recommendation for DC plan and follow-up therapies;Home health PT    Equipment Recommendations  None recommended by PT    Recommendations for Other Services       Precautions / Restrictions Precautions Precautions: Fall Restrictions Weight Bearing Restrictions: No Other Position/Activity Restrictions: WBAT      Mobility  Bed Mobility Overal bed mobility: Needs Assistance Bed Mobility: Sit to Supine       Sit to supine: Min assist   General bed mobility comments: assist to raise Lt LE onto bed to return to supine.    Transfers Overall transfer level: Needs assistance Equipment used: Rolling walker (2 wheeled) Transfers: Sit to/from Stand Sit to Stand: Min guard;Supervision         General transfer comment: pt demontrates safe hand placement on RW and no assist required for power up. min guard/supervision for safety.  Ambulation/Gait Ambulation/Gait assistance: Supervision Gait Distance (Feet): 200 Feet Assistive device: Rolling walker (2 wheeled) Gait  Pattern/deviations: Step-through pattern;Decreased stride length Gait velocity: fair   General Gait Details: pt with safe step through pattern and maintained safe proximity to RW. no over LOB noted.  Stairs            Wheelchair Mobility    Modified Rankin (Stroke Patients Only)       Balance Overall balance assessment: Mild deficits observed, not formally tested                                           Pertinent Vitals/Pain Pain Assessment: No/denies pain    Home Living Family/patient expects to be discharged to:: Private residence Living Arrangements: Children Available Help at Discharge: Family Type of Home: House Home Access: Stairs to enter Entrance Stairs-Rails: Right Entrance Stairs-Number of Steps: 1 Home Layout: One level Home Equipment: Environmental consultant - 2 wheels;Cane - single point      Prior Function Level of Independence: Independent               Hand Dominance   Dominant Hand: Right    Extremity/Trunk Assessment   Upper Extremity Assessment Upper Extremity Assessment: Overall WFL for tasks assessed    Lower Extremity Assessment Lower Extremity Assessment: Overall WFL for tasks assessed    Cervical / Trunk Assessment Cervical / Trunk Assessment: Normal  Communication   Communication: No difficulties  Cognition Arousal/Alertness: Awake/alert Behavior During Therapy: WFL for tasks assessed/performed Overall Cognitive Status: Within Functional Limits for tasks assessed  General Comments      Exercises     Assessment/Plan    PT Assessment Patient needs continued PT services  PT Problem List Decreased strength;Decreased range of motion;Decreased activity tolerance;Decreased balance;Decreased mobility       PT Treatment Interventions DME instruction;Gait training;Stair training;Functional mobility training;Therapeutic activities;Therapeutic exercise;Balance  training;Patient/family education    PT Goals (Current goals can be found in the Care Plan section)  Acute Rehab PT Goals Patient Stated Goal: get [facility] Goal Formulation: With patient Time For Goal Achievement: 10/28/20 Potential to Achieve Goals: Good    Frequency 7X/week   Barriers to discharge        Co-evaluation               AM-PAC PT "6 Clicks" Mobility  Outcome Measure Help needed turning from your back to your side while in a flat bed without using bedrails?: None Help needed moving from lying on your back to sitting on the side of a flat bed without using bedrails?: None Help needed moving to and from a bed to a chair (including a wheelchair)?: A Little Help needed standing up from a chair using your arms (e.g., wheelchair or bedside chair)?: A Little Help needed to walk in hospital room?: A Little Help needed climbing 3-5 steps with a railing? : A Little 6 Click Score: 20    End of Session Equipment Utilized During Treatment: Gait belt Activity Tolerance: Patient tolerated treatment well Patient left: in bed;with call bell/phone within reach;with bed alarm set Nurse Communication: Mobility status PT Visit Diagnosis: Muscle weakness (generalized) (M62.81);Difficulty in walking, not elsewhere classified (R26.2)    Time: 7014-1030 PT Time Calculation (min) (ACUTE ONLY): 20 min   Charges:   PT Evaluation $PT Eval Low Complexity: 1 Low          Verner Mould, DPT Acute Rehabilitation Services Office [phone] Pager [phone]   Jacques Navy 10/21/2020, 5:56 PM

## 2020-10-21 NOTE — Brief Op Note (Signed)
10/21/2020  8:27 AM  PATIENT:  Angel Rivera  70 y.o. female  PRE-OPERATIVE DIAGNOSIS:  Osteoarthritis Left Hip  POST-OPERATIVE DIAGNOSIS:  Osteoarthritis Left Hip  PROCEDURE:  Procedure(s): LEFT TOTAL HIP ARTHROPLASTY ANTERIOR APPROACH (Left)  SURGEON:  Surgeon(s) and Role:    Mcarthur Rossetti, MD - Primary  PHYSICIAN ASSISTANT:  Benita Stabile, PA-C  ANESTHESIA:   spinal  EBL:  200 mL   COUNTS:  YES  TOURNIQUET:  * No tourniquets in log *  DICTATION: .Other Dictation: Dictation Number [id number]  PLAN OF CARE: Admit for overnight observation  PATIENT DISPOSITION:  PACU - hemodynamically stable.   Delay start of Pharmacological VTE agent (>24hrs) due to surgical blood loss or risk of bleeding: no

## 2020-10-21 NOTE — Op Note (Signed)
NAME: Angel Rivera, Angel Rivera RECORD NO: [id number] ACCOUNT NO: [id number] DATE OF BIRTH: 1951-03-08 FACILITY: [facility] LOCATION: [facility] PHYSICIAN: Lind Guest. Ninfa Linden, MD  Operative Report   DATE OF PROCEDURE: 10/21/2020  PREOPERATIVE DIAGNOSES:  Primary osteoarthritis and degenerative joint disease, left hip.  POSTOPERATIVE DIAGNOSES:  Primary osteoarthritis and degenerative joint disease, left hip.  PROCEDURE:  Left total hip arthroplasty through direct anterior approach.  IMPLANTS:  DePuy Sector Gription acetabular component size 50, size 32+0 neutral polyethylene liner, size 12 Corail femoral component with standard offset, size 32+9 metal hip ball.  SURGEON:  Lind Guest. Ninfa Linden, MD  ASSISTANT: Erskine Emery, PA-C  ANESTHESIA:  Spinal.  ANTIBIOTICS:  2 grams IV Ancef.  BLOOD LOSS:  914 mL  COMPLICATIONS:  None.  INDICATIONS:  The patient is a 70 year old female well known to me.  She has debilitating arthritis involving both her hips and actually replaced her right hip in February of this year.  She has a significant leg length discrepancy, which she had even  before surgery with the left side significantly shorter than the right.  She has end-stage arthritis of the left hip and at this point does wish to proceed with a total hip arthroplasty on the left side and we agree with this given the deformity she has  in her left hip and well-documented osteoarthritis.  She has tried and failed all forms of conservative treatment.  Having had the surgery before, she is fully aware of the risk of acute blood loss anemia, nerve and vessel injury, fracture, infection,  dislocation, DVT, implant failure and skin and soft tissue issues.  She understands our goals are to decrease pain, improve mobility and overall improve quality of life.  DESCRIPTION OF PROCEDURE:  After informed consent was obtained, appropriate left hip was marked.  She was brought to the operating room and sat up  on her stretcher where spinal anesthesia was obtained.  She was laid in the supine position on the  stretcher.  Foley catheter was placed.  I assessed her leg lengths and she is significantly shorter in the left, comparing left and right.  Traction boots were placed on both her feet.  Next, she was placed supine on the Hana fracture table, the perineal  post in place and both legs in line skeletal traction device and no traction applied.  Her left operative hip was prepped and draped with DuraPrep and sterile drapes.  A time-out was called and she was identified correct patient, correct left hip.  I  then made an incision just inferior and posterior to the anterior superior iliac spine and carried this obliquely down the leg.  We dissected down the tensor fascia lata muscle.  Tensor fascia was then divided longitudinally to proceed with direct  anterior approach to the hip.  We identified and cauterized circumflex vessels and then identified the hip capsule, opened up the hip capsule in L-type format, finding a moderate joint effusion and significant periarticular osteophytes around the lateral  femoral head and neck.  We placed Cobra retractors around the medial and lateral femoral neck and made our femoral neck cut with oscillating saw just proximal to the lesser trochanter and completed this with an osteotome.  We placed a corkscrew guide in  the femoral head and removed the femoral head in its entirety and found a wide area devoid of cartilage.  I then placed a bent Hohmann over the medial acetabular rim and removed remnants of the acetabular labrum and other debris.  I then began reaming  under direct visualization from a size 43 reamer in stepwise increments going up to a size 49, with all reamers placed under direct visualization, last reamer was placed under direct fluoroscopy, so we could obtain our depth of reaming, our inclination  and anteversion.  I then placed the real DePuy Sector Gription  acetabular component size 50 and a 32+0 neutral polyethylene liner for that size acetabular component.  Attention was then turned to the femur. With the leg externally rotated to 120 degrees,  extended and adducted, we were able to place a Mueller retractor medially and Hohmann retractor behind the greater trochanter, we released the lateral joint capsule and used a box cutting osteotome to enter the femoral canal.  We then used a Corail  broaching system going from size 8 to a size 12.  With a size 12 in place, we trialed a standard offset femoral neck and a 32+1 hip ball, reduced this in the acetabulum.  We knew we needed to increase her leg length significantly based on preoperative  exam and preoperative standing films.  It was stable on exam, but we knew we want to get it significantly longer based on our clinical exam.  We dislocated the hip and removed the trial components.  We placed the real Corail femoral component size 12  with standard offset with a 32+9 hip ball, reduced this in the acetabulum and it was very tight, but we felt like we approximated our leg lengths and offset based on range of motion and stability testing and radiographic assessment.  We then irrigated  the soft tissue with normal saline solution using pulsatile lavage.  We closed the joint capsule with interrupted #1 Ethibond suture followed by #1 Vicryl to close the tensor fascia.  0 Vicryl was used to close the deep tissue and 2-0 Vicryl was used to  close the subcutaneous tissue.  The skin was closed with staples.  An Aquacel dressing was applied.  She was taken off the Hana table and taken to recovery room in stable condition with all final counts being correct.  No complications noted.  Of note,  Benita Stabile, PA-C, assisted during the entire case and assistance was crucial for facilitating all aspects of this case.   SHW D: 10/21/2020 8:24:25 am T: 10/21/2020 9:41:00 am  JOB: [id number]/ [id number]

## 2020-10-21 NOTE — Anesthesia Procedure Notes (Addendum)
Spinal  Patient location during procedure: OR Start time: 10/21/2020 7:15 AM End time: 10/21/2020 7:20 AM Reason for block: surgical anesthesia Staffing Performed: anesthesiologist  Anesthesiologist: Freddrick March, MD Preanesthetic Checklist Completed: patient identified, IV checked, risks and benefits discussed, surgical consent, monitors and equipment checked, pre-op evaluation and timeout performed Spinal Block Patient position: sitting Prep: DuraPrep and site prepped and draped Patient monitoring: cardiac monitor, continuous pulse ox and blood pressure Approach: midline Location: L3-4 Injection technique: single-shot Needle Needle type: Pencan  Needle gauge: 24 G Needle length: 9 cm Assessment Sensory level: T6 Events: CSF return Additional Notes Functioning IV was confirmed and monitors were applied. Sterile prep and drape, including hand hygiene and sterile gloves were used. The patient was positioned and the spine was prepped. The skin was anesthetized with lidocaine.  Free flow of clear CSF was obtained prior to injecting local anesthetic into the CSF.  The spinal needle aspirated freely following injection.  The needle was carefully withdrawn.  The patient tolerated the procedure well.

## 2020-10-21 NOTE — H&P (Signed)
TOTAL HIP ADMISSION H&P  Patient is admitted for left total hip arthroplasty.  Subjective:  Chief Complaint: left hip pain  HPI: Angel Rivera, 70 y.o. female, has a history of pain and functional disability in the left hip(s) due to arthritis and patient has failed non-surgical conservative treatments for greater than 12 weeks to include NSAID's and/or analgesics, corticosteriod injections, flexibility and strengthening excercises, supervised PT with diminished ADL's post treatment, use of assistive devices, and activity modification.  Onset of symptoms was gradual starting 5 years ago with gradually worsening course since that time.The patient noted no past surgery on the left hip(s).  Patient currently rates pain in the left hip at 10 out of 10 with activity. Patient has night pain, worsening of pain with activity and weight bearing, trendelenberg gait, pain that interfers with activities of daily living, and pain with passive range of motion. Patient has evidence of subchondral sclerosis, periarticular osteophytes, and joint space narrowing by imaging studies. This condition presents safety issues increasing the risk of falls.  There is no current active infection.  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   Hyperlipidemia    Hypertension    Hypotension    Myocardial infarction ([place]) 08/17/2009   Tobacco user     Past Surgical History:  Procedure Laterality Date   ABDOMINAL HYSTERECTOMY  1980s   laparoscopic   CARDIAC CATHETERIZATION N/A 12/30/2015   Procedure: Left Heart Cath and Coronary Angiography;  Surgeon: Burnell Blanks, MD;  Location: [facility];  Service: Cardiovascular;  Laterality: N/A;   CARDIAC CATHETERIZATION N/A 12/30/2015   Procedure: Coronary Stent Intervention;  Surgeon: Burnell Blanks, MD;  Location: [facility];  Service: Cardiovascular;  Laterality: N/A;   CORONARY ANGIOPLASTY WITH STENT PLACEMENT  08/17/2009   "1 stent"   TOTAL HIP ARTHROPLASTY Right 06/17/2020   Procedure: RIGHT TOTAL HIP ARTHROPLASTY ANTERIOR APPROACH;  Surgeon: Mcarthur Rossetti, MD;  Location: [facility];  Service: Orthopedics;  Laterality: Right;   TUBAL LIGATION  ~ 1980    Current Facility-Administered Medications  Medication Dose Route Frequency Provider Last Rate Last Admin   ceFAZolin (ANCEF) IVPB 2g/100 mL premix  2 g Intravenous On Call to OR Pete Pelt, PA-C       lactated ringers infusion   Intravenous Continuous Lidia Collum, MD 10 mL/hr at 10/21/20 0646 Continued from Pre-op at 10/21/20 0646   tranexamic acid (CYKLOKAPRON) IVPB 1,000 mg  1,000 mg Intravenous To OR Pete Pelt, PA-C       Allergies  Allergen Reactions   Atorvastatin Other (See Comments)    Leg pain    Social History   Tobacco Use   Smoking status: Former    Packs/day: 0.50    Years: 33.00    Pack years: 16.50  Types: Cigarettes    Quit date: 05/07/2013    Years since quitting: 7.4   Smokeless tobacco: Never  Substance Use Topics   Alcohol use: Not Currently    Comment: 12/30/2015 "might have a drink once/year"    Family History  Problem Relation Age of Onset   Heart attack Mother    Coronary artery disease Other    Heart disease  Father        bypass   Heart disease Brother        bypass     Review of Systems  Musculoskeletal:  Positive for gait problem.  All other systems reviewed and are negative.  Objective:  Physical Exam Vitals reviewed.  Constitutional:      Appearance: Normal appearance.  HENT:     Head: Normocephalic and atraumatic.  Eyes:     Extraocular Movements: Extraocular movements intact.     Pupils: Pupils are equal, round, and reactive to light.  Cardiovascular:     Rate and Rhythm: Normal rate and regular rhythm.  Pulmonary:     Effort: Pulmonary effort is normal.     Breath sounds: Normal breath sounds.  Abdominal:     Palpations: Abdomen is soft.  Musculoskeletal:     Cervical back: Normal range of motion and neck supple.     Left hip: Tenderness and bony tenderness present. Decreased range of motion. Decreased strength.  Neurological:     Mental Status: She is alert and oriented to person, place, and time.  Psychiatric:        Behavior: Behavior normal.    Vital signs in last 24 hours: Temp:  [98.5 F (36.9 C)] 98.5 F (36.9 C) (06/17 0544) Pulse Rate:  [72] 72 (06/17 0544) Resp:  [18] 18 (06/17 0544) BP: (131)/(63) 131/63 (06/17 0544) SpO2:  [99 %] 99 % (06/17 0544) Weight:  [63.5 kg] 63.5 kg (06/17 0553)  Labs:   Estimated body mass index is 24.8 kg/m as calculated from the following:   Height as of this encounter: 5\' 3"  (1.6 m).   Weight as of this encounter: 63.5 kg.   Imaging Review Plain radiographs demonstrate severe degenerative joint disease of the left hip(s). The bone quality appears to be good for age and reported activity level.      Assessment/Plan:  End stage arthritis, left hip(s)  The patient history, physical examination, clinical judgement of the provider and imaging studies are consistent with end stage degenerative joint disease of the left hip(s) and total hip arthroplasty is deemed medically necessary. The treatment options  including medical management, injection therapy, arthroscopy and arthroplasty were discussed at length. The risks and benefits of total hip arthroplasty were presented and reviewed. The risks due to aseptic loosening, infection, stiffness, dislocation/subluxation,  thromboembolic complications and other imponderables were discussed.  The patient acknowledged the explanation, agreed to proceed with the plan and consent was signed. Patient is being admitted for inpatient treatment for surgery, pain control, PT, OT, prophylactic antibiotics, VTE prophylaxis, progressive ambulation and ADL's and discharge planning.The patient is planning to be discharged home with home health services

## 2020-10-21 NOTE — Progress Notes (Signed)
PHARMACIST - PHYSICIAN ORDER COMMUNICATION  CONCERNING: P&T Medication Policy on Herbal Medications  DESCRIPTION:  This patient's order for:  Biotin  has been noted.  This product(s) is classified as an "herbal" or natural product. Due to a lack of definitive safety studies or FDA approval, nonstandard manufacturing practices, plus the potential risk of unknown drug-drug interactions while on inpatient medications, the Pharmacy and Therapeutics Committee does not permit the use of "herbal" or natural products of this type within [facility].   ACTION TAKEN: The pharmacy department is unable to verify this order at this time and your patient has been informed of this safety policy. Please reevaluate patient's clinical condition at discharge and address if the herbal or natural product(s) should be resumed at that time.   Dia Sitter, PharmD, BCPS 10/21/2020 10:16 AM

## 2020-10-22 DIAGNOSIS — M1612 Unilateral primary osteoarthritis, left hip: Secondary | ICD-10-CM | POA: Diagnosis not present

## 2020-10-22 LAB — BASIC METABOLIC PANEL
Anion gap: 6 (ref 5–15)
BUN: 9 mg/dL (ref 8–23)
CO2: 25 mmol/L (ref 22–32)
Calcium: 8.3 mg/dL — ABNORMAL LOW (ref 8.9–10.3)
Chloride: 110 mmol/L (ref 98–111)
Creatinine, Ser: 0.81 mg/dL (ref 0.44–1.00)
GFR, Estimated: >60 mL/min (ref >60)
Glucose, Bld: 124 mg/dL — ABNORMAL HIGH (ref 70–99)
Potassium: 3.6 mmol/L (ref 3.5–5.1)
Sodium: 141 mmol/L (ref 135–145)

## 2020-10-22 MED ORDER — HYDROCODONE-ACETAMINOPHEN 5-325 MG PO TABS: 1.0000 | ORAL_TABLET | Freq: Four times a day (QID) | ORAL | 0 refills | Status: DC | PRN

## 2020-10-22 MED ORDER — ASPIRIN 81 MG PO CHEW
81.0000 mg | CHEWABLE_TABLET | Freq: Two times a day (BID) | ORAL | 0 refills | Status: DC
Start: 2020-10-22 — End: 2022-01-23

## 2020-10-22 MED ORDER — METHOCARBAMOL 500 MG PO TABS
500.0000 mg | ORAL_TABLET | Freq: Four times a day (QID) | ORAL | 1 refills | Status: DC | PRN
Start: 2020-10-22 — End: 2021-01-04

## 2020-10-22 NOTE — Progress Notes (Signed)
Subjective: 1 Day Post-Op Procedure(s) (LRB): LEFT TOTAL HIP ARTHROPLASTY ANTERIOR APPROACH (Left) Patient reports pain as moderate.  Working well with therapy.  Objective: Vital signs in last 24 hours: Temp:  [97.5 F (36.4 C)-98.6 F (37 C)] 98.6 F (37 C) (06/18 0536) Pulse Rate:  [59-78] 78 (06/18 0536) Resp:  [15-18] 16 (06/18 0536) BP: (115-145)/(51-69) 115/51 (06/18 0536) SpO2:  [99 %-100 %] 99 % (06/18 0536)  Intake/Output from previous day: 06/17 0701 - 06/18 0700 In: 3351.6 [P.O.:870; I.V.:2408.2; IV Piggyback:73.3] Out: 2850 [Urine:2650; Blood:200] Intake/Output this shift: Total I/O In: 240 [P.O.:240] Out: -   Recent Labs    10/19/20 1336  HGB 12.6   Recent Labs    10/19/20 1336  WBC 10.1  RBC 4.06  HCT 38.8  PLT 295   Recent Labs    10/19/20 1336 10/22/20 0753  NA 143 141  K 4.4 3.6  CL 111 110  CO2 26 25  BUN 16 9  CREATININE 0.90 0.81  GLUCOSE 100* 124*  CALCIUM 9.3 8.3*   No results for input(s): LABPT, INR in the last 72 hours.  Sensation intact distally Intact pulses distally Dorsiflexion/Plantar flexion intact Incision: dressing C/D/I   Assessment/Plan: 1 Day Post-Op Procedure(s) (LRB): LEFT TOTAL HIP ARTHROPLASTY ANTERIOR APPROACH (Left) Up with therapy Discharge home with home health      Mcarthur Rossetti 10/22/2020, 10:49 AM

## 2020-10-22 NOTE — TOC Transition Note (Signed)
Transition of Care [facility]) - CM/SW Discharge Note  Patient Details  Name: Angel Rivera MRN: [id number] Date of Birth: January 26, 1951  [facility] CM/SW Contact:  Sherie Don, LCSW Phone Number: 10/22/2020, 10:46 AM  Clinical Narrative: Patient is expected to discharge after working with PT. CSW met with patient to review discharge plan. Per patient, her first visit with [facility] is scheduled for Monday June 20. Patient has a rolling walker, 3N1, shower chair, and cane at home so there are no DME needs at this time. TOC signing off.  Final next level of care: [place] Barriers to Discharge: No Barriers Identified  Patient Goals and CMS Choice Patient states their goals for this hospitalization and ongoing recovery are:: Discharge home with HHPT through [place] CMS Medicare.gov Compare Post Acute Care list provided to:: Patient Choice offered to / list presented to : Patient  Discharge Plan and Services        DME Arranged: N/A DME Agency: NA HH Arranged: PT HH Agency: [place] Representative spoke with at [place] in orthopedist's office  Readmission Risk Interventions No flowsheet data found.

## 2020-10-22 NOTE — Discharge Instructions (Signed)

## 2020-10-22 NOTE — Progress Notes (Signed)
Physical Therapy Treatment Patient Details Name: Angel Rivera MRN: [id number] DOB: [birth year] Today's Date: 10/22/2020    History of Present Illness Patient is 70 y.o. female s/p Lt THA anterior approach on 10/21/20 with PMH significant for HTN, HLD, CAD, MI in 2011 s/p stent, OA, Rt THA  on 06/17/20.    PT Comments    Progressing well with mobility. Reviewed/practiced exercises, gait training, and stair training. All education completed. No questions/concerns from pt-she is familiar with process from having other hip replaced a few months ago. Pt is eager to d/c home. Okay to d/c from PT standpoint.     Follow Up Recommendations  Follow surgeon's recommendation for DC plan and follow-up therapies     Equipment Recommendations  None recommended by PT    Recommendations for Other Services       Precautions / Restrictions Precautions Precautions: Fall Restrictions Weight Bearing Restrictions: No Other Position/Activity Restrictions: WBAT    Mobility  Bed Mobility               General bed mobility comments: oob in recliner    Transfers Overall transfer level: Needs assistance Equipment used: Rolling walker (2 wheeled) Transfers: Sit to/from Stand Sit to Stand: Supervision            Ambulation/Gait Ambulation/Gait assistance: Supervision Gait Distance (Feet): 200 Feet Assistive device: Rolling walker (2 wheeled) Gait Pattern/deviations: Step-through pattern;Decreased stride length     General Gait Details: no lob. no lightheadedness. pt tolerated distance well.   Stairs Stairs: Yes Stairs assistance: Supervision Stair Management: Step to pattern;Forwards;With walker Number of Stairs: 1 General stair comments: supv for safety, cues. no physical assistance provided.   Wheelchair Mobility    Modified Rankin (Stroke Patients Only)       Balance Overall balance assessment: Mild deficits observed, not formally tested                                           Cognition Arousal/Alertness: Awake/alert Behavior During Therapy: WFL for tasks assessed/performed Overall Cognitive Status: Within Functional Limits for tasks assessed                                        Exercises Total Joint Exercises Hip ABduction/ADduction: AROM;Left;10 reps;Standing Marching in Standing: AROM;Both;10 reps;Standing General Exercises - Lower Extremity Heel Raises: AROM;Both;10 reps;Standing    General Comments        Pertinent Vitals/Pain Pain Assessment: 0-10 Pain Score: 5  Pain Location: L hip/thigh Pain Descriptors / Indicators: Discomfort;Sore Pain Intervention(s): Monitored during session    Home Living                      Prior Function            PT Goals (current goals can now be found in the care plan section) Progress towards PT goals: Progressing toward goals    Frequency    7X/week      PT Plan Current plan remains appropriate    Co-evaluation              AM-PAC PT "6 Clicks" Mobility   Outcome Measure  Help needed turning from your back to your side while in a flat bed without using bedrails?: None Help needed moving from lying  on your back to sitting on the side of a flat bed without using bedrails?: None Help needed moving to and from a bed to a chair (including a wheelchair)?: None Help needed standing up from a chair using your arms (e.g., wheelchair or bedside chair)?: None Help needed to walk in hospital room?: A Little Help needed climbing 3-5 steps with a railing? : A Little 6 Click Score: 22    End of Session   Activity Tolerance: Patient tolerated treatment well Patient left: in bed;with call bell/phone within reach   PT Visit Diagnosis: Other abnormalities of gait and mobility (R26.89);Pain Pain - Right/Left: Left Pain - part of body: Hip     Time: 0174-9449 PT Time Calculation (min) (ACUTE ONLY): 15 min  Charges:  $Gait Training: 8-22  mins                         Doreatha Massed, PT Acute Rehabilitation  Office: [phone] Pager: [phone]

## 2020-10-22 NOTE — Discharge Summary (Signed)
Patient ID: Angel Rivera MRN: [id number] DOB/AGE: 1950-10-10 70 y.o.  Admit date: 10/21/2020 Discharge date: 10/22/2020  Admission Diagnoses:  Principal Problem:   Unilateral primary osteoarthritis, left hip Active Problems:   Status post left hip replacement   Discharge Diagnoses:  Same  Past Medical History:  Diagnosis Date   Arthritis    Coronary artery disease    NATIVE VESSEL (ICD-414.01)-cath 08/17/09 with subtotally occluded RCA, moderate disease LAD.   Hyperlipidemia    Hypertension    Hypotension    Myocardial infarction ([place]) 08/17/2009   Tobacco user     Surgeries: Procedure(s): LEFT TOTAL HIP ARTHROPLASTY ANTERIOR APPROACH on 10/21/2020   Consultants:   Discharged Condition: Improved  Hospital Course: MARIYANNA MUCHA is an 70 y.o. female who was admitted 10/21/2020 for operative treatment ofUnilateral primary osteoarthritis, left hip. Patient has severe unremitting pain that affects sleep, daily activities, and work/hobbies. After pre-op clearance the patient was taken to the operating room on 10/21/2020 and underwent  Procedure(s): LEFT TOTAL HIP ARTHROPLASTY ANTERIOR APPROACH.    Patient was given perioperative antibiotics:  Anti-infectives (From admission, onward)    Start     Dose/Rate Route Frequency Ordered Stop   10/21/20 1400  ceFAZolin (ANCEF) IVPB 1 g/50 mL premix        1 g 100 mL/hr over 30 Minutes Intravenous Every 6 hours 10/21/20 1013 10/22/20 0758   10/21/20 0600  ceFAZolin (ANCEF) IVPB 2g/100 mL premix        2 g 200 mL/hr over 30 Minutes Intravenous On call to O.R. 10/21/20 0522 10/21/20 1660        Patient was given sequential compression devices, early ambulation, and chemoprophylaxis to prevent DVT.  Patient benefited maximally from hospital stay and there were no complications.    Recent vital signs: Patient Vitals for the past 24 hrs:  BP Temp Temp src Pulse Resp SpO2  10/22/20 0536 (!) 115/51 98.6 F (37 C) Oral 78 16 99 %   10/22/20 0116 (!) 137/57 98 F (36.7 C) Oral 70 16 99 %  10/21/20 2206 128/66 98 F (36.7 C) Oral 76 16 99 %  10/21/20 1820 137/64 98.2 F (36.8 C) Oral 66 15 100 %  10/21/20 1414 (!) 145/69 (!) 97.5 F (36.4 C) Oral 63 17 100 %  10/21/20 1145 (!) 143/65 97.6 F (36.4 C) Oral (!) 59 18 100 %     Recent laboratory studies:  Recent Labs    10/19/20 1336 10/22/20 0753  WBC 10.1  --   HGB 12.6  --   HCT 38.8  --   PLT 295  --   NA 143 141  K 4.4 3.6  CL 111 110  CO2 26 25  BUN 16 9  CREATININE 0.90 0.81  GLUCOSE 100* 124*  CALCIUM 9.3 8.3*     Discharge Medications:   Allergies as of 10/22/2020       Reactions   Atorvastatin Other (See Comments)   Leg pain        Medication List     STOP taking these medications    traMADol 50 MG tablet Commonly known as: ULTRAM       TAKE these medications    ascorbic acid 500 MG tablet Commonly known as: VITAMIN C Take 500 mg by mouth daily.   aspirin 81 MG chewable tablet Chew 1 tablet (81 mg total) by mouth 2 (two) times daily.   B-complex with vitamin C tablet Take 1 tablet by mouth daily.  Biotin 5000 MCG Tabs Take 5,000 mcg by mouth daily.   cholecalciferol 25 MCG (1000 UNIT) tablet Commonly known as: VITAMIN D Take 1,000 Units by mouth daily.   HYDROcodone-acetaminophen 5-325 MG tablet Commonly known as: NORCO/VICODIN Take 1-2 tablets by mouth every 6 (six) hours as needed for moderate pain (pain score 4-6).   LORazepam 0.5 MG tablet Commonly known as: ATIVAN Take 0.5 mg by mouth at bedtime as needed for sleep.   lovastatin 40 MG tablet Commonly known as: MEVACOR Take 1 tablet (40 mg total) by mouth at bedtime.   methocarbamol 500 MG tablet Commonly known as: ROBAXIN Take 1 tablet (500 mg total) by mouth every 6 (six) hours as needed for muscle spasms.   milk thistle 175 MG tablet Take 175 mg by mouth daily.       ASK your doctor about these medications    amoxicillin 500 MG  tablet Commonly known as: AMOXIL Take 2 tabs by mouth one hour before dental appointment then take 2 tabs by mouth six hours after appointment   metoprolol succinate 25 MG 24 hr tablet Commonly known as: TOPROL-XL TAKE 1/2 TABLET BY MOUTH EVERY DAY               Durable Medical Equipment  (From admission, onward)           Start     Ordered   10/21/20 1014  DME 3 n 1  Once        10/21/20 1013   10/21/20 1014  DME Walker rolling  Once       Question Answer Comment  Walker: With [place] Wheels   Patient needs a walker to treat with the following condition Status post left hip replacement      10/21/20 1013            Diagnostic Studies: DG Pelvis Portable  Result Date: 10/21/2020 CLINICAL DATA:  Status post total hip replacement EXAM: PORTABLE PELVIS 1-2 VIEWS COMPARISON:  Intraoperative images left hip October 21, 2020 FINDINGS: Frontal pelvis obtained. There is a total hip replacement on each side with prosthetic components appearing well-seated bilaterally. No acute fracture or dislocation. Soft tissue air on the left and skin staples on the left. 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 IMPRESSION: Total hip replacement on each side with prosthetic components bilaterally well-seated on frontal view on each side. No acute fracture or dislocation. Acute postoperative changes on the left. Electronically Signed   By: Lowella Grip III M.D.   On: 10/21/2020 09:00   DG C-Arm 1-60 Min-No Report  Result Date: 10/21/2020 Fluoroscopy was utilized by the requesting physician.  No radiographic interpretation.   DG HIP OPERATIVE UNILAT W OR W/O PELVIS LEFT  Result Date: 10/21/2020 CLINICAL DATA:  Status post total hip replacement EXAM: OPERATIVE LEFT HIP  1 VIEW TECHNIQUE: Fluoroscopic spot image(s) were submitted for interpretation post-operatively. FLUOROSCOPY TIME:  0 minutes 11 seconds; 3 acquired images COMPARISON:  Sep 06, 2020 FINDINGS: There  is a total hip replacement on the left with prosthetic components appearing well-seated on frontal view. No fracture or dislocation. Total hip replacement also noted on the right with visualized prosthetic components well-seated. IMPRESSION: There is no a total hip replacement on each side. Acute postoperative change noted on the left with prosthetic components on the left well-seated on frontal view. No acute fracture or dislocation. Electronically Signed   By: Lowella Grip III M.D.   On: 10/21/2020 08:57  Disposition: Discharge disposition: 01-Home or Self Care          Follow-up Information     Mcarthur Rossetti, MD Follow up in 2 week(s).   Specialty: Orthopedic Surgery Contact information: [address] [place] [address] [phone]                  Signed: Mcarthur Rossetti 10/22/2020, 10:51 AM

## 2020-10-22 NOTE — Plan of Care (Signed)
Instructions were reviewed with patient. All questions were answered. Patient was transported to main entrance by wheelchair. ° °

## 2020-10-24 ENCOUNTER — Encounter (HOSPITAL_COMMUNITY): Payer: Self-pay | Admitting: Orthopaedic Surgery

## 2020-10-24 DIAGNOSIS — Z955 Presence of coronary angioplasty implant and graft: Secondary | ICD-10-CM | POA: Diagnosis not present

## 2020-10-24 DIAGNOSIS — Z471 Aftercare following joint replacement surgery: Secondary | ICD-10-CM | POA: Diagnosis not present

## 2020-10-24 DIAGNOSIS — I2510 Atherosclerotic heart disease of native coronary artery without angina pectoris: Secondary | ICD-10-CM | POA: Diagnosis not present

## 2020-10-24 DIAGNOSIS — E7800 Pure hypercholesterolemia, unspecified: Secondary | ICD-10-CM | POA: Diagnosis not present

## 2020-10-24 DIAGNOSIS — I252 Old myocardial infarction: Secondary | ICD-10-CM | POA: Diagnosis not present

## 2020-10-24 DIAGNOSIS — I10 Essential (primary) hypertension: Secondary | ICD-10-CM | POA: Diagnosis not present

## 2020-10-24 DIAGNOSIS — Z7982 Long term (current) use of aspirin: Secondary | ICD-10-CM | POA: Diagnosis not present

## 2020-10-24 DIAGNOSIS — Z87890 Personal history of sex reassignment: Secondary | ICD-10-CM | POA: Diagnosis not present

## 2020-10-24 DIAGNOSIS — Z96643 Presence of artificial hip joint, bilateral: Secondary | ICD-10-CM | POA: Diagnosis not present

## 2020-10-28 ENCOUNTER — Other Ambulatory Visit: Payer: Self-pay | Admitting: Orthopaedic Surgery

## 2020-10-28 ENCOUNTER — Telehealth: Payer: Self-pay | Admitting: Orthopaedic Surgery

## 2020-10-28 MED ORDER — HYDROCODONE-ACETAMINOPHEN 5-325 MG PO TABS: 1.0000 | ORAL_TABLET | Freq: Four times a day (QID) | ORAL | 0 refills | Status: DC | PRN

## 2020-10-28 NOTE — Telephone Encounter (Signed)
Please advise 

## 2020-10-28 NOTE — Telephone Encounter (Signed)
Pt called asking for a refill on her Hydrocodone prescription. The best pharmacy is the one on file and call back number if needed is [phone].

## 2020-11-03 ENCOUNTER — Ambulatory Visit (INDEPENDENT_AMBULATORY_CARE_PROVIDER_SITE_OTHER): Payer: PPO | Admitting: Orthopaedic Surgery

## 2020-11-03 ENCOUNTER — Encounter: Payer: Self-pay | Admitting: Orthopaedic Surgery

## 2020-11-03 DIAGNOSIS — Z96641 Presence of right artificial hip joint: Secondary | ICD-10-CM

## 2020-11-03 DIAGNOSIS — Z96642 Presence of left artificial hip joint: Secondary | ICD-10-CM

## 2020-11-03 MED ORDER — CYCLOBENZAPRINE HCL 10 MG PO TABS: 10.0000 mg | ORAL_TABLET | Freq: Three times a day (TID) | ORAL | 1 refills | Status: DC | PRN

## 2020-11-03 NOTE — Progress Notes (Signed)
The patient is 2 weeks tomorrow status post a left total hip arthroplasty.  We did replace her right hip earlier this year.  She has severe arthritis in both her hips.  She had a significant leg length discrepancy after the first case due to the severity of her arthritis of her left hip.  Her leg lengths are now mild to equal.  She is at our time with pain control but overall does feel like she is more balance and her leg lengths are equal.  The staples been removed and Steri-Strips applied from her left hip area.  I tried to drain the seroma off the hip but is more of a hematoma.  There is some foot and ankle swelling but her calf is soft.  She has been on a baby aspirin twice a day.  She was on a baby aspirin once a day prior to this and can go back to once a day.  She will continue to wear her TED hose and work on pumping her feet and increase her activities as comfort allows.  Since none of the medications have helped we will try Flexeril instead of Robaxin.  All questions and concerns were answered and addressed.  I would like to see her back in 4 weeks for repeat exam but no x-rays are needed.

## 2020-12-01 ENCOUNTER — Other Ambulatory Visit: Payer: Self-pay

## 2020-12-01 ENCOUNTER — Ambulatory Visit (INDEPENDENT_AMBULATORY_CARE_PROVIDER_SITE_OTHER): Payer: PPO | Admitting: Orthopaedic Surgery

## 2020-12-01 ENCOUNTER — Encounter: Payer: Self-pay | Admitting: Orthopaedic Surgery

## 2020-12-01 DIAGNOSIS — Z96641 Presence of right artificial hip joint: Secondary | ICD-10-CM

## 2020-12-01 DIAGNOSIS — Z96642 Presence of left artificial hip joint: Secondary | ICD-10-CM

## 2020-12-01 NOTE — Progress Notes (Signed)
The patient is almost 6-week status post a left total hip arthroplasty.  We replaced her right hip back in February.  She had a significant leg length discrepancy and now she is equal.  She is having a harder time with the left hip than the right hip in terms of just the pain she is having.  She is 70 years old.  She is walking without assistive device.  On laying supine her ligaments are equal.  She does have more pain with rotation of her more recent left hip than the right hip.  I gave her reassurance that this should get better with time.  She has no restrictions from my standpoint.  She can get in the pool.  I would like to see her back in 4 weeks to see how she is doing overall but no x-rays are needed.

## 2020-12-07 DIAGNOSIS — H6692 Otitis media, unspecified, left ear: Secondary | ICD-10-CM | POA: Diagnosis not present

## 2020-12-28 ENCOUNTER — Ambulatory Visit: Payer: PPO | Admitting: Orthopaedic Surgery

## 2020-12-31 ENCOUNTER — Ambulatory Visit (INDEPENDENT_AMBULATORY_CARE_PROVIDER_SITE_OTHER): Payer: PPO

## 2020-12-31 ENCOUNTER — Other Ambulatory Visit: Payer: Self-pay

## 2020-12-31 ENCOUNTER — Ambulatory Visit
Admission: EM | Admit: 2020-12-31 | Discharge: 2020-12-31 | Disposition: A | Discharge status: Home / Self Care | Payer: PPO | Attending: Emergency Medicine | Admitting: Emergency Medicine

## 2020-12-31 DIAGNOSIS — M25531 Pain in right wrist: Secondary | ICD-10-CM | POA: Diagnosis not present

## 2020-12-31 DIAGNOSIS — W19XXXA Unspecified fall, initial encounter: Secondary | ICD-10-CM

## 2020-12-31 DIAGNOSIS — N644 Mastodynia: Secondary | ICD-10-CM | POA: Diagnosis not present

## 2020-12-31 DIAGNOSIS — S52511A Displaced fracture of right radial styloid process, initial encounter for closed fracture: Secondary | ICD-10-CM | POA: Diagnosis not present

## 2020-12-31 MED ORDER — HYDROCODONE-ACETAMINOPHEN 5-325 MG PO TABS: 1.0000 | ORAL_TABLET | Freq: Three times a day (TID) | ORAL | 0 refills | Status: DC | PRN

## 2020-12-31 NOTE — ED Provider Notes (Addendum)
[facility]   UI:[id number] 12/31/20 Arrival Time: 0805  CC: RT wrist PAIN  SUBJECTIVE: History from: patient. Angel Rivera is a 70 y.o. female complains of RT wrist pain and injury x 1 day.  Reports fall forward injuring RT wrist and breast.    Localizes the pain to the back of wrist.  Describes the pain as intermittent and 7/10 in character.  Has tried OTC medications without relief.  Symptoms are made worse with movement.  Reports associated swelling, and ecchymosis.  Denies similar symptoms in the past.  Denies fever, chills, erythema.  ROS: As per HPI.  All other pertinent ROS negative.     Past Medical History:  Diagnosis Date   Arthritis    Coronary artery disease    NATIVE VESSEL (ICD-414.01)-cath 08/17/09 with subtotally occluded RCA, moderate disease LAD.   Hyperlipidemia    Hypertension    Hypotension    Myocardial infarction ([place]) 08/17/2009   Tobacco user    Past Surgical History:  Procedure Laterality Date   ABDOMINAL HYSTERECTOMY  1980s   laparoscopic   CARDIAC CATHETERIZATION N/A 12/30/2015   Procedure: Left Heart Cath and Coronary Angiography;  Surgeon: Burnell Blanks, MD;  Location: [facility];  Service: Cardiovascular;  Laterality: N/A;   CARDIAC CATHETERIZATION N/A 12/30/2015   Procedure: Coronary Stent Intervention;  Surgeon: Burnell Blanks, MD;  Location: [facility];  Service: Cardiovascular;  Laterality: N/A;   CORONARY ANGIOPLASTY WITH STENT PLACEMENT  08/17/2009   "1 stent"   TOTAL HIP ARTHROPLASTY Right 06/17/2020   Procedure: RIGHT TOTAL HIP ARTHROPLASTY ANTERIOR APPROACH;  Surgeon: Mcarthur Rossetti, MD;  Location: [facility];  Service: Orthopedics;  Laterality: Right;   TOTAL HIP ARTHROPLASTY Left 10/21/2020   Procedure: LEFT TOTAL HIP ARTHROPLASTY ANTERIOR APPROACH;  Surgeon: Mcarthur Rossetti, MD;  Location: [facility];  Service: Orthopedics;  Laterality: Left;   TUBAL LIGATION  ~ 1980   Allergies  Allergen  Reactions   Atorvastatin Other (See Comments)    Leg pain   No current facility-administered medications on file prior to encounter.   Current Outpatient Medications on File Prior to Encounter  Medication Sig Dispense Refill   ascorbic acid (VITAMIN C) 500 MG tablet Take 500 mg by mouth daily.     aspirin 81 MG chewable tablet Chew 1 tablet (81 mg total) by mouth 2 (two) times daily. 30 tablet 0   B Complex-C (B-COMPLEX WITH VITAMIN C) tablet Take 1 tablet by mouth daily.     Biotin 5000 MCG TABS Take 5,000 mcg by mouth daily.     cholecalciferol (VITAMIN D) 25 MCG (1000 UNIT) tablet Take 1,000 Units by mouth daily.     cyclobenzaprine (FLEXERIL) 10 MG tablet Take 1 tablet (10 mg total) by mouth 3 (three) times daily as needed for muscle spasms. 40 tablet 1   LORazepam (ATIVAN) 0.5 MG tablet Take 0.5 mg by mouth at bedtime as needed for sleep.     lovastatin (MEVACOR) 40 MG tablet Take 1 tablet (40 mg total) by mouth at bedtime. 90 tablet 3   methocarbamol (ROBAXIN) 500 MG tablet Take 1 tablet (500 mg total) by mouth every 6 (six) hours as needed for muscle spasms. 40 tablet 1   metoprolol succinate (TOPROL-XL) 25 MG 24 hr tablet TAKE 1/2 TABLET BY MOUTH EVERY DAY (Patient taking differently: Take 12.5 mg by mouth daily.) 45 tablet 2   milk thistle 175 MG tablet Take 175 mg by mouth daily.     [  DISCONTINUED] furosemide (LASIX) 40 MG tablet Take 1 tablet (40 mg total) by mouth daily as needed for edema. (Patient not taking: Reported on 10/12/2020) 90 tablet 3   Social History   Socioeconomic History   Marital status: Divorced    Spouse name: Not on file   Number of children: Not on file   Years of education: Not on file   Highest education level: Not on file  Occupational History   Not on file  Tobacco Use   Smoking status: Former    Packs/day: 0.50    Years: 33.00    Pack years: 16.50    Types: Cigarettes    Quit date: 05/07/2013    Years since quitting: 7.6   Smokeless tobacco:  Never  Vaping Use   Vaping Use: Never used  Substance and Sexual Activity   Alcohol use: Not Currently    Comment: 12/30/2015 "might have a drink once/year"   Drug use: No   Sexual activity: Not on file  Other Topics Concern   Not on file  Social History Narrative   The patient has a almost 1-pack per day tobacco use     history.     She occasionally has alcohol-no abuse.     No illicit drug use   Divorced, 2 children   Social Determinants of Radio broadcast assistant Strain: Not on file  Food Insecurity: Not on file  Transportation Needs: Not on file  Physical Activity: Not on file  Stress: Not on file  Social Connections: Not on file  Intimate Partner Violence: Not on file   Family History  Problem Relation Age of Onset   Heart attack Mother    Coronary artery disease Other    Heart disease Father        bypass   Heart disease Brother        bypass    OBJECTIVE:  Vitals:   12/31/20 0826  BP: 132/73  Pulse: 82  Resp: 18  Temp: 98.7 F (37.1 C)  TempSrc: Oral  SpO2: 98%    General appearance: ALERT; in no acute distress.  Head: NCAT Lungs: Normal respiratory effort CV: Radial pulse 2+ Musculoskeletal: RT wrist/ chest wall  Inspection: Skin warm, dry, clear and intact without obvious erythema, effusion, or ecchymosis.  Palpation: TTP over lateral posterior wrist along distal radius; TTP over RT breast/ RT lateral ribs ROM: LROM Strength: deferred Skin: warm and dry Neurologic: Ambulates without difficulty; Sensation intact about the upper/ lower extremities Psychological: alert and cooperative; normal mood and affect  DIAGNOSTIC STUDIES:  DG Wrist Complete Right  Result Date: 12/31/2020 CLINICAL DATA:  Right wrist pain after fall last night. EXAM: RIGHT WRIST - COMPLETE 3+ VIEW COMPARISON:  May 04, 2016. FINDINGS: Mildly displaced fracture is seen involving the radial styloid. No other bony abnormality is noted. No soft tissue abnormality is  noted. IMPRESSION: Mildly displaced radial styloid fracture. Electronically Signed   By: Marijo Conception M.D.   On: 12/31/2020 08:55     ASSESSMENT & PLAN:  1. Right wrist pain   2. Closed displaced fracture of styloid process of right radius, initial encounter   3. Breast pain, right      Meds ordered this encounter  Medications   HYDROcodone-acetaminophen (NORCO/VICODIN) 5-325 MG tablet    Sig: Take 1 tablet by mouth every 8 (eight) hours as needed.    Dispense:  10 tablet    Refill:  0    Order Specific Question:  Supervising Provider    Answer:   Raylene Everts [id number]   We will hold off on rib series x-rays today.  Patient denies SOB, or trouble breathing.  Localizes most of her symptoms to breast also X-rays positive for mildly displaced radial styloid fracture, wrist fracture Splint applied Continue conservative management of rest, ice, and elevation Continue to alternate ibuprofen and/or tylenol as needed Norco for severe break-through pain.  DO NOT TAKE prior to driving or operating heavy machinery Follow up with orthopedist for further evaluation and management Return or go to the ER if you have any new or worsening symptoms (fever, chills, bruising, deformity, numbness/ tingling, etc...)   [place] Controlled Substances Registry consulted for this patient. I feel the risk/benefit ratio today is favorable for proceeding with this prescription for a controlled substance. Medication sedation precautions given.  Reviewed expectations re: course of current medical issues. Questions answered. Outlined signs and symptoms indicating need for more acute intervention. Patient verbalized understanding. After Visit Summary given.     Lestine Box, PA-C 12/31/20 [place], Dale, PA-C 01/12/21 [phone]

## 2020-12-31 NOTE — Discharge Instructions (Addendum)
We will hold off on rib series x-rays today.  Patient denies SOB, or trouble breathing.  Localizes most of her symptoms to breast also X-rays positive for mildly displaced radial styloid fracture, wrist fracture Splint applied Continue conservative management of rest, ice, and elevation Continue to alternate ibuprofen and/or tylenol as needed Norco for severe break-through pain.  DO NOT TAKE prior to driving or operating heavy machinery Follow up with orthopedist for further evaluation and management Return or go to the ER if you have any new or worsening symptoms (fever, chills, bruising, deformity, numbness/ tingling, etc...)

## 2020-12-31 NOTE — ED Triage Notes (Signed)
Yesterday, while transitioning from a sitting to standing position, Pt fell forward on her face causing a sudden onset of right sided breast pain and right wrist pain. Pt c/o swelling, pain and bruising on her right wrist with limited ROM. No LOC. Has been taking Ibuprofen with minimal relief.

## 2021-01-04 ENCOUNTER — Ambulatory Visit (INDEPENDENT_AMBULATORY_CARE_PROVIDER_SITE_OTHER): Payer: PPO | Admitting: Orthopaedic Surgery

## 2021-01-04 ENCOUNTER — Encounter: Payer: Self-pay | Admitting: Orthopedic Surgery

## 2021-01-04 ENCOUNTER — Ambulatory Visit (INDEPENDENT_AMBULATORY_CARE_PROVIDER_SITE_OTHER): Payer: PPO | Admitting: Orthopedic Surgery

## 2021-01-04 ENCOUNTER — Encounter: Payer: Self-pay | Admitting: Orthopaedic Surgery

## 2021-01-04 ENCOUNTER — Other Ambulatory Visit: Payer: Self-pay

## 2021-01-04 VITALS — BP 147/77 | HR 80 | Ht 63.0 in | Wt 142.0 lb

## 2021-01-04 DIAGNOSIS — S52514A Nondisplaced fracture of right radial styloid process, initial encounter for closed fracture: Secondary | ICD-10-CM

## 2021-01-04 DIAGNOSIS — Z96642 Presence of left artificial hip joint: Secondary | ICD-10-CM

## 2021-01-04 NOTE — Patient Instructions (Signed)
No lifting with right arm  Ok to remove for hygiene  ROM of finger is ok, limit movement of wrist.

## 2021-01-04 NOTE — Progress Notes (Signed)
The patient is about 8 to 9 weeks out from a left total hip arthroplasty.  We replaced her right hip back in February.  She is doing very well overall but last week and had a mechanical fall when she got up out of a chair quickly and got lightheaded.  She ended up bruising her ribs and the leg and even sustained a right radial styloid fracture of the wrist but is nondisplaced unfortunately I did place her in a removable thumb spica splint.  Overall she says she is doing well and walking without assistive device.  She is a very active and young appearing 22.  Both left and right hips move smoothly and fluidly.  She is in a Velcro wrist splint on the right wrist and seems to be doing well.  I did review the x-rays of the wrist fracture itself and it is a radial styloid fracture that should do well.  I talked to her about this in length.  Since she is doing so well we do not need to see her back for 6 months unless there are issues.  We will have a standing low AP pelvis at that visit.

## 2021-01-05 ENCOUNTER — Encounter: Payer: Self-pay | Admitting: Orthopedic Surgery

## 2021-01-05 NOTE — Progress Notes (Signed)
New Patient Visit  Assessment: Angel Rivera is a 70 y.o. female with the following: 1. Closed nondisplaced fracture of styloid process of right radius, initial encounter  Plan: Patient was evaluated for right wrist pain, and previous x-rays demonstrate a minimally displaced fracture of the radial styloid.  She has tolerated a removable thumb spica splint well without issues.  We will continue with this splint for the next few weeks.  We will see her back and repeat x-rays.  Depending on how she is doing at that point, we could consider advancing her level of activities.  Medications as needed.  Contact clinic if they have any issues.   Follow-up: Return for 2-3 weeks.  Subjective:  Chief Complaint  Patient presents with   Wrist Injury    Right wrist fracture/ fall 12/30/20    History of Present Illness: Angel Rivera is a 70 y.o. female who presents for evaluation of right wrist pain.  A few days ago, she reports falling, directly onto her right wrist.  She states that she got up from a seated position, felt lightheaded and lost her balance.  She noted immediate pain.  She presented to the urgent care the following day, and was noted to have a fracture.  She presents today for further follow-up.  She been wearing a removable thumb spica splint, and notes that her pain is improving.  She does tolerate some range of motion of her right wrist.  No numbness or tingling in the right hand.  No additional injuries.  Of note, she had total hip arthroplasty completed by Dr. Ninfa Linden approximately 2-3 months ago, and is moving around very well.   Review of Systems: No fevers or chills No numbness or tingling No chest pain No shortness of breath No bowel or bladder dysfunction No GI distress No headaches   Medical History:  Past Medical History:  Diagnosis Date   Arthritis    Coronary artery disease    NATIVE VESSEL (ICD-414.01)-cath 08/17/09 with subtotally occluded RCA, moderate  disease LAD.   Hyperlipidemia    Hypertension    Hypotension    Myocardial infarction ([place]) 08/17/2009   Tobacco user     Past Surgical History:  Procedure Laterality Date   ABDOMINAL HYSTERECTOMY  1980s   laparoscopic   CARDIAC CATHETERIZATION N/A 12/30/2015   Procedure: Left Heart Cath and Coronary Angiography;  Surgeon: Burnell Blanks, MD;  Location: [facility];  Service: Cardiovascular;  Laterality: N/A;   CARDIAC CATHETERIZATION N/A 12/30/2015   Procedure: Coronary Stent Intervention;  Surgeon: Burnell Blanks, MD;  Location: [facility];  Service: Cardiovascular;  Laterality: N/A;   CORONARY ANGIOPLASTY WITH STENT PLACEMENT  08/17/2009   "1 stent"   TOTAL HIP ARTHROPLASTY Right 06/17/2020   Procedure: RIGHT TOTAL HIP ARTHROPLASTY ANTERIOR APPROACH;  Surgeon: Mcarthur Rossetti, MD;  Location: [facility];  Service: Orthopedics;  Laterality: Right;   TOTAL HIP ARTHROPLASTY Left 10/21/2020   Procedure: LEFT TOTAL HIP ARTHROPLASTY ANTERIOR APPROACH;  Surgeon: Mcarthur Rossetti, MD;  Location: [facility];  Service: Orthopedics;  Laterality: Left;   TUBAL LIGATION  ~ 1980    Family History  Problem Relation Age of Onset   Heart attack Mother    Coronary artery disease Other    Heart disease Father        bypass   Heart disease Brother        bypass   Social History   Tobacco Use   Smoking  status: Former    Packs/day: 0.50    Years: 33.00    Pack years: 16.50    Types: Cigarettes    Quit date: 05/07/2013    Years since quitting: 7.6   Smokeless tobacco: Never  Vaping Use   Vaping Use: Never used  Substance Use Topics   Alcohol use: Not Currently    Comment: 12/30/2015 "might have a drink once/year"   Drug use: No    Allergies  Allergen Reactions   Atorvastatin Other (See Comments)    Leg pain    Current Meds  Medication Sig   ascorbic acid (VITAMIN C) 500 MG tablet Take 500 mg by mouth daily.   aspirin 81 MG chewable tablet Chew 1  tablet (81 mg total) by mouth 2 (two) times daily.   B Complex-C (B-COMPLEX WITH VITAMIN C) tablet Take 1 tablet by mouth daily.   Biotin 5000 MCG TABS Take 5,000 mcg by mouth daily.   cholecalciferol (VITAMIN D) 25 MCG (1000 UNIT) tablet Take 1,000 Units by mouth daily.   LORazepam (ATIVAN) 0.5 MG tablet Take 0.5 mg by mouth at bedtime as needed for sleep.   lovastatin (MEVACOR) 40 MG tablet Take 1 tablet (40 mg total) by mouth at bedtime.   metoprolol succinate (TOPROL-XL) 25 MG 24 hr tablet TAKE 1/2 TABLET BY MOUTH EVERY DAY (Patient taking differently: Take 12.5 mg by mouth daily.)   milk thistle 175 MG tablet Take 175 mg by mouth daily.   [DISCONTINUED] cyclobenzaprine (FLEXERIL) 10 MG tablet Take 1 tablet (10 mg total) by mouth 3 (three) times daily as needed for muscle spasms.   [DISCONTINUED] methocarbamol (ROBAXIN) 500 MG tablet Take 1 tablet (500 mg total) by mouth every 6 (six) hours as needed for muscle spasms.    Objective: BP (!) 147/77   Pulse 80   Ht '5\' 3"'$  (1.6 m)   Wt 142 lb (64.4 kg)   BMI 25.15 kg/m   Physical Exam:  General: Alert and oriented. and No acute distress. Gait: Normal gait.  Evaluation of right wrist demonstrates mild swelling.  No bruising is appreciated.  She is tenderness to palpation at the distal radius.  She tolerates gentle range of motion.  Fingers are warm and well-perfused.  Sensation is intact in the superficial radial, median nerve and ulnar nerve distribution.  Active motion intact to the right hand.  2+ radial pulse.  IMAGING: I personally reviewed images previously obtained from the ED  X-rays of the right wrist demonstrates a nondisplaced fracture at the radial styloid.  No other injuries are noted.  New Medications:  No orders of the defined types were placed in this encounter.     Mordecai Rasmussen, MD  01/05/2021 11:07 AM

## 2021-01-17 ENCOUNTER — Ambulatory Visit: Payer: PPO | Admitting: Orthopedic Surgery

## 2021-01-17 ENCOUNTER — Other Ambulatory Visit: Payer: Self-pay

## 2021-01-17 ENCOUNTER — Encounter: Payer: Self-pay | Admitting: Orthopedic Surgery

## 2021-01-17 ENCOUNTER — Ambulatory Visit: Payer: PPO

## 2021-01-17 DIAGNOSIS — S52514D Nondisplaced fracture of right radial styloid process, subsequent encounter for closed fracture with routine healing: Secondary | ICD-10-CM

## 2021-01-17 NOTE — Patient Instructions (Signed)
**No strengthening yet**  Wrist Fracture Rehab Ask your health care provider which exercises are safe for you. Do exercises exactly as told by your health care provider and adjust them as directed. It is normal to feel mild stretching, pulling, tightness, or discomfort as you do these exercises. Stop right away if you feel sudden pain or your pain gets worse. Do not begin these exercises until told by your health care provider. Stretching and range-of-motion exercises These exercises warm up your muscles and joints and improve the movement and flexibility of your wrist and hand. These exercises also help to relieve pain,numbness, and tingling. Finger flexion and extension Sit or stand with your elbow at your side. Open and stretch your left / right fingers as wide as you can (extension). Hold this position for 10 seconds. Close your left / right fingers into a gentle fist (flexion). Hold this position for 10 seconds. Slowly return to the starting position. Repeat 10 times. Complete this exercise 1-2 times a day. Wrist flexion Bend your left / right elbow to a 90-degree angle (right angle) with your palm facing the floor. Bend your wrist forward so your fingers point toward the floor (flexion). Hold this position for 10 seconds. Slowly return to the starting position. Repeat 10 times. Complete this exercise 1-2 times a day. Wrist extension Bend your left / right elbow to a 90-degree angle (right angle) with your palm facing the floor. Bend your wrist backward so your fingers point toward the ceiling (extension). Hold this position for 10 seconds. Slowly return to the starting position. Repeat 10 times. Complete this exercise 1-2 times a day. Ulnar deviation Bend your left / right elbow to a 90-degree angle (right angle), and rest your forearm on a table with your palm facing down. Keeping your hand flat on the table, bend your left / right wrist toward your small finger (pinkie). This is  ulnar deviation. Hold this position for 10 seconds. Slowly return to the starting position. Repeat 10 times. Complete this exercise 1-2 times a day. Radial deviation Bend your left / right elbow to a 90-degree angle (right angle), and rest your forearm on a table with your palm facing down. Keeping your hand flat on the table, bend your left / right wrist toward your thumb. This is radial deviation. Hold this position for 10 seconds. Slowly return to the starting position. Repeat 10 times. Complete this exercise 1-2 times a day. Forearm rotation, supination Stand or sit with your left / right elbow bent to a 90-degree angle (right angle) at your side. Position your forearm so that the thumb is facing the ceiling (neutral position). Turn (rotate) your palm up toward the ceiling (supination), stopping when you feel a gentle stretch. Hold this position for 10 seconds. Slowly return to the starting position. Repeat 10 times. Complete this exercise 1-2 times a day. Forearm rotation, pronation Stand or sit with your left / right elbow bent to a 90-degree angle (right angle) at your side. Position your forearm so that the thumb is facing the ceiling (neutral position). Turn (rotate) your palm down toward the floor (pronation), stopping when you feel a gentle stretch. Hold this position for 10 seconds. Slowly return to the starting position. Repeat 10 times. Complete this exercise 1-2 times a day. Wrist flexion stretch  Extend your left / right arm in front of you and turn your palm down toward the floor. If told by your health care provider, bend your left / right arm  to a 90-degree angle (right angle) at your side. Using your uninjured hand, gently press over the back of your left / right hand to bend your wrist and fingers toward the floor (flexion). Go as far as you can to feel a stretch without causing pain. Hold this position for 10 seconds. Slowly return to the starting position. Repeat  10 times. Complete this exercise 1-2 times a day. Wrist extension stretch  Extend your left / right arm in front of you and turn your palm up toward the ceiling. If told by your health care provider, bend your left / right arm to a 90-degree angle (right angle) at your side. Using your uninjured hand, gently press over the palm of your left / right hand to bend your wrist and fingers toward the floor (extension). Go as far as you can to feel a stretch without causing pain. Hold this position for 10 seconds. Slowly return to the starting position. Repeat 10 times. Complete this exercise 1-2 times a day. Forearm rotation stretch, supination Stand or sit with your arms at your sides. Bend your left / right elbow to a 90-degree angle (right angle). Using your uninjured hand, turn your left / right palm up toward the ceiling (assisted supination) until you feel a gentle stretch in the inside of your forearm. Hold this position for 10 seconds. Slowly return to the starting position. Repeat 10 times. Complete this exercise 1-2 times a day. Forearm rotation stretch, pronation Stand or sit with your arms at your sides. Bend your left / right elbow to a 90-degree angle (right angle). Using your uninjured hand, turn your left / right palm down toward the floor (assisted pronation) until you feel a gentle stretch in the top of your forearm. Hold this position for 10 seconds. Slowly return to the starting position. Repeat 10 times. Complete this exercise 1-2 times a day. Strengthening exercises These exercises build strength and endurance in your wrist and hand. Enduranceis the ability to use your muscles for a long time, even after they get tired. Wrist flexion Sit with your left / right forearm supported on a table. Your elbow should be at waist height. Rest your hand over the edge of the table, palm up. Gently grasp a 5 lb / kg weight (can of soup). Or, hold an exercise band or tube in both hands,  keeping your hands at the same level and hip distance apart. There should be slight tension in the exercise band or tube. Without moving your forearm or elbow, slowly bend your wrist up toward the ceiling (wrist flexion). Hold this position for 10 seconds. Slowly return to the starting position. Repeat 10 times. Complete this exercise 1-2 times a day. Wrist extension Sit with your left / right forearm supported on a table. Your elbow should be at waist height. Rest your hand over the edge of the table, palm down. Gently grasp a 5 lb / kg weight. Or, hold an exercise band or tube in both hands, keeping your hands at the same level and hip distance apart. There should be slight tension in the exercise band or tube. Without moving your forearm or elbow, slowly curl your hand up toward the ceiling (extension). Hold this position for 10 seconds. Slowly return to the starting position. Repeat 10 times. Complete this exercise 1-2 times a day. Forearm rotation, supination  Sit with your left / right forearm supported on a table. Your elbow should be at waist height. Rest your hand over  the edge of the table, palm down. Gently grasp a lightweight hammer near the head. As this exercise gets easier for you, try holding the hammer farther down the handle. Without moving your elbow, slowly turn (rotate) your palm up toward the ceiling (supination). Hold this position for 10 seconds. Slowly return to the starting position. Repeat 10 times. Complete this exercise 1-2 times a day. Forearm rotation, pronation  Sit with your left / right forearm supported on a table. Your elbow should be at waist height. Rest your hand over the edge of the table, palm up. Gently grasp a lightweight hammer near the head. As this exercise gets easier for you, try holding the hammer farther down the handle. Without moving your elbow, slowly turn (rotate) your palm down toward the floor (pronation). Hold this position for 10  seconds. Slowly return to the starting position. Repeat 10 times. Complete this exercise 1-2 times a day. Grip strengthening  Hold one of these items in your left / right hand: a dense sponge, a stress ball, or a large, rolled sock. Slowly squeeze the object as hard as you can without increasing any pain. Hold your squeeze for 10 seconds. Slowly release your grip. Repeat 10 times. Complete this exercise 1-2 times a day. This information is not intended to replace advice given to you by your health care provider. Make sure you discuss any questions you have with your healthcare provider. Document Revised: 09/03/2019 Document Reviewed: 09/03/2019 Elsevier Patient Education  [place].

## 2021-01-18 NOTE — Progress Notes (Signed)
Orthopaedic Clinic Return  Assessment: Angel Rivera is a 70 y.o. female with the following: Right radial styloid fracture, minimally displaced  Plan: Patient continues to improve.  She has minimal pain.  She has been using the removable thumb spica splint at all times, but notes improvement in swelling, and the splint is a little bit loose.  She is not having any issues otherwise.  She can start to come out of her splint to work on gentle range of motion.  I have advised her to avoid lifting anything heavier than a coffee cup.  She should also avoid working on power grip at this time.  We will see her back in 3 weeks, at that time, we can likely advance her to strengthening of her right wrist.   Follow-up: Return in about 3 weeks (around 02/07/2021).   Subjective:  Chief Complaint  Patient presents with   Wrist Injury    Right     History of Present Illness: Angel Rivera is a 70 y.o. female who returns to clinic for repeat evaluation of right wrist pain.  Approximately 3 weeks ago, she fell and sustained a minimally displaced right radial styloid fracture.  She has been using a removable thumb spica splint.  Her pain is improved.  Her swelling has improved.  She notes that the splint is a little bit loose at this time.  She is taking medications occasionally.  She has not started working on range of motion.  She has only been removing the splint for hygiene.  She also notes that she has some pain on the ulnar side of her hand.  Review of Systems: No fevers or chills No numbness or tingling No chest pain No shortness of breath No bowel or bladder dysfunction No GI distress No headaches   Objective: There were no vitals taken for this visit.  Physical Exam:  Alert and oriented.  No acute distress.  Evaluation of the right wrist demonstrates no swelling.  No bruising is appreciated.  She does have tenderness to palpation of the radial styloid.  She tolerates gentle range of  motion of her wrist.  Fingers are warm and well-perfused.  Sensations intact throughout the right hand.  Tenderness to palpation within the TFCC.  She has pain in this area with ulnar deviation.  IMAGING: I personally ordered and reviewed the following images:  X-rays of the right wrist were obtained in clinic today and demonstrates a minimally displaced fracture of the right radial styloid.  This is more apparent on x-rays today, in comparison to previous x-rays.  There has been a small amount of interval consolidation.  No further displacement.  No other injuries are noted.  Impression: Minimally displaced right radial styloid fracture  Angel Rasmussen, MD 01/18/2021 8:53 AM

## 2021-01-25 ENCOUNTER — Ambulatory Visit: Payer: PPO | Admitting: Orthopedic Surgery

## 2021-02-07 ENCOUNTER — Other Ambulatory Visit: Payer: Self-pay

## 2021-02-07 ENCOUNTER — Ambulatory Visit (INDEPENDENT_AMBULATORY_CARE_PROVIDER_SITE_OTHER): Payer: PPO | Admitting: Orthopedic Surgery

## 2021-02-07 ENCOUNTER — Ambulatory Visit: Payer: PPO

## 2021-02-07 ENCOUNTER — Encounter: Payer: Self-pay | Admitting: Orthopedic Surgery

## 2021-02-07 VITALS — Ht 63.0 in | Wt 142.0 lb

## 2021-02-07 DIAGNOSIS — S52514D Nondisplaced fracture of right radial styloid process, subsequent encounter for closed fracture with routine healing: Secondary | ICD-10-CM

## 2021-02-07 NOTE — Progress Notes (Signed)
Orthopaedic Clinic Return  Assessment: Angel Rivera is a 70 y.o. female with the following: Right radial styloid fracture, minimally displaced  Plan: Patient denies pain in her right wrist.  She has been removing the brace to work on some range of motion.  Radiographs demonstrates interval consolidation at the fracture site.  Injury was 6 weeks ago, and is now time for her to advance her activities.  It is okay for her to resume her activities as tolerated.  No restrictions.  Follow-up as needed.  Follow-up: Return if symptoms worsen or fail to improve.   Subjective:  Chief Complaint  Patient presents with   Follow-up    Recheck on right wrist, DOI 12-30-20.    History of Present Illness: Angel Rivera is a 71 y.o. female who returns to clinic for repeat evaluation of right wrist pain.  She sustained a right radial styloid fracture, approximately 6 weeks ago.  She has been using a removable thumb spica splint.  She denies pain in her wrist at this time.  She does endorse mild stiffness.  She is not taking medications for her wrist.  She is planning a trip to the beach in approximately 2 weeks, and is interested in being able to go fishing.  Review of Systems: No fevers or chills No numbness or tingling No chest pain No shortness of breath No bowel or bladder dysfunction No GI distress No headaches   Objective: Ht 5\' 3"  (1.6 m)   Wt 142 lb (64.4 kg)   BMI 25.15 kg/m   Physical Exam:  Alert and oriented.  No acute distress.  Evaluation of the right wrist demonstrates no deformity.  No skin breakdown.  Minimal tenderness to palpation over the radial styloid.  She has near full flexion and extension, as well as pronation and supination.  Fingers are warm and well-perfused.  Sensation is intact throughout the right hand.  2+ radial pulse.  IMAGING: I personally ordered and reviewed the following images:  X-rays of the right wrist were obtained in clinic today, compared to  previous x-rays.  There is a fracture of the radial styloid, which is essentially nondisplaced.  There has been interval consolidation at the fracture site.  No acute injuries are noted.  No evidence of a step-off within the wrist joint.  Impression: Healed right radial styloid fracture  Mordecai Rasmussen, MD 02/07/2021 12:54 PM

## 2021-02-16 ENCOUNTER — Other Ambulatory Visit (HOSPITAL_COMMUNITY): Payer: Self-pay | Admitting: Family Medicine

## 2021-02-16 DIAGNOSIS — Z1231 Encounter for screening mammogram for malignant neoplasm of breast: Secondary | ICD-10-CM

## 2021-03-13 ENCOUNTER — Telehealth: Payer: Self-pay | Admitting: Cardiovascular Disease

## 2021-03-13 NOTE — Telephone Encounter (Signed)
Called the patient.  She is going to place renewal for handicap form in the mail for Dr. Angelena Form to complete and then we will mail back to her.   She is in agreement and appreciative for this.

## 2021-03-13 NOTE — Telephone Encounter (Signed)
New Message:     Pt says she need her handicap form signed by Dr Angelena Form. She wants to know if she needs to bring it by or mail it?

## 2021-03-23 ENCOUNTER — Ambulatory Visit (HOSPITAL_COMMUNITY): Payer: PPO

## 2021-03-24 ENCOUNTER — Ambulatory Visit (HOSPITAL_COMMUNITY)
Admission: RE | Admit: 2021-03-24 | Discharge: 2021-03-24 | Disposition: A | Discharge status: Home / Self Care | Payer: PPO | Source: Ambulatory Visit | Attending: Family Medicine | Admitting: Family Medicine

## 2021-03-24 ENCOUNTER — Other Ambulatory Visit: Payer: Self-pay

## 2021-03-24 DIAGNOSIS — Z1231 Encounter for screening mammogram for malignant neoplasm of breast: Secondary | ICD-10-CM | POA: Insufficient documentation

## 2021-03-27 NOTE — Telephone Encounter (Signed)
Received handicap form to be completed by Dr. Angelena Form.  Placed on cart for review/signature by MD.

## 2021-04-03 ENCOUNTER — Telehealth: Payer: Self-pay

## 2021-04-03 NOTE — Telephone Encounter (Signed)
Pt called and stated that they haven't received their paperwork that they sent in for dr. Angelena Form to sign regarding handicap sticker. Please advise.

## 2021-04-03 NOTE — Telephone Encounter (Signed)
Provided MyChart update that the form will be placed back in outgoing mail once signed by the doctor later this week.

## 2021-04-12 DIAGNOSIS — Z1212 Encounter for screening for malignant neoplasm of rectum: Secondary | ICD-10-CM | POA: Diagnosis not present

## 2021-04-19 MED ORDER — METOPROLOL SUCCINATE ER 25 MG PO TB24: 12.5000 mg | ORAL_TABLET | Freq: Every day | ORAL | 1 refills | Status: DC

## 2021-04-19 NOTE — Addendum Note (Signed)
Addended by: Rodman Key on: 04/19/2021 10:51 AM   Modules accepted: Orders

## 2021-04-19 NOTE — Telephone Encounter (Signed)
Refilled Toprol XL per patient request.  She has upcoming appointment with Dr. Angelena Form in February.

## 2021-04-25 ENCOUNTER — Other Ambulatory Visit (HOSPITAL_COMMUNITY): Payer: Self-pay | Admitting: Nurse Practitioner

## 2021-04-25 DIAGNOSIS — Z780 Asymptomatic menopausal state: Secondary | ICD-10-CM

## 2021-05-17 ENCOUNTER — Ambulatory Visit (HOSPITAL_COMMUNITY)
Admission: RE | Admit: 2021-05-17 | Discharge: 2021-05-17 | Disposition: A | Discharge status: Home / Self Care | Payer: PPO | Source: Ambulatory Visit | Attending: Nurse Practitioner | Admitting: Nurse Practitioner

## 2021-05-17 ENCOUNTER — Other Ambulatory Visit: Payer: Self-pay

## 2021-05-17 DIAGNOSIS — Z780 Asymptomatic menopausal state: Secondary | ICD-10-CM | POA: Diagnosis present

## 2021-06-12 ENCOUNTER — Encounter: Payer: Self-pay | Admitting: Orthopedic Surgery

## 2021-06-12 ENCOUNTER — Ambulatory Visit: Payer: PPO | Admitting: Orthopedic Surgery

## 2021-06-12 ENCOUNTER — Other Ambulatory Visit: Payer: Self-pay

## 2021-06-12 VITALS — BP 109/73 | HR 78 | Ht 63.0 in | Wt 140.0 lb

## 2021-06-12 DIAGNOSIS — M654 Radial styloid tenosynovitis [de Quervain]: Secondary | ICD-10-CM

## 2021-06-12 DIAGNOSIS — M65332 Trigger finger, left middle finger: Secondary | ICD-10-CM

## 2021-06-12 NOTE — Progress Notes (Signed)
Chief Complaint  Patient presents with   Hand Pain    Left middle finger ? Trigger and also has Right thumb/ wrist pain    71 yo female pain left long finger and pulling sensation, pain is in the palm   Exam tender palm over the A1 pulley  No locking or clicking   Trigger finger injection  Diagnosis  left long finger  Procedure injection A1 pulley Medications lidocaine 1% 1 mL and Depo-Medrol 40 mg 1 mL Skin prep alcohol and ethyl chloride Verbal consent was obtained Timeout confirmed the injection site  After cleaning the skin with alcohol and anesthetizing the skin with ethyl chloride the A1 pulley was palpated and the injection was performed without complication  Encounter Diagnoses  Name Primary?   Trigger finger, left middle finger Yes   De Quervain's tenosynovitis, right     Also c/o pain right wrist 1 st ext compartment pain   Has a splint   + finkelsteins test   Continue splinting

## 2021-06-19 ENCOUNTER — Encounter: Payer: Self-pay | Admitting: Cardiovascular Disease

## 2021-06-19 ENCOUNTER — Other Ambulatory Visit: Payer: Self-pay

## 2021-06-19 ENCOUNTER — Ambulatory Visit: Payer: PPO | Admitting: Cardiovascular Disease

## 2021-06-19 VITALS — BP 124/72 | HR 70 | Ht 63.0 in | Wt 139.0 lb

## 2021-06-19 DIAGNOSIS — I2510 Atherosclerotic heart disease of native coronary artery without angina pectoris: Secondary | ICD-10-CM

## 2021-06-19 DIAGNOSIS — E7800 Pure hypercholesterolemia, unspecified: Secondary | ICD-10-CM

## 2021-06-19 LAB — LIPID PANEL
Chol/HDL Ratio: 2.5 ratio (ref 0.0–4.4)
Cholesterol, Total: 154 mg/dL (ref 100–199)
HDL: 61 mg/dL (ref >39)
LDL Chol Calc (NIH): 67 mg/dL (ref 0–99)
Triglycerides: 151 mg/dL — ABNORMAL HIGH (ref 0–149)
VLDL Cholesterol Cal: 26 mg/dL (ref 5–40)

## 2021-06-19 LAB — HEPATIC FUNCTION PANEL
ALT: 23 IU/L (ref 0–32)
AST: 26 IU/L (ref 0–40)
Albumin: 4.7 g/dL (ref 3.7–4.7)
Alkaline Phosphatase: 121 IU/L (ref 44–121)
Bilirubin Total: 0.9 mg/dL (ref 0.0–1.2)
Bilirubin, Direct: 0.24 mg/dL (ref 0.00–0.40)
Total Protein: 7.1 g/dL (ref 6.0–8.5)

## 2021-06-19 NOTE — Patient Instructions (Signed)
Medication Instructions:  No changes *If you need a refill on your cardiac medications before your next appointment, please call your pharmacy*   Lab Work: Today: lipids/liver function test   Testing/Procedures: none   Follow-Up: At [facility], you and your health needs are our priority.  As part of our continuing mission to provide you with exceptional heart care, we have created designated Provider Care Teams.  These Care Teams include your primary Cardiologist (physician) and Advanced Practice Providers (APPs -  Physician Assistants and Nurse Practitioners) who all work together to provide you with the care you need, when you need it.  Your next appointment:   12 month(s)  The format for your next appointment:   In Person  Provider:   Lauree Chandler, MD     Other Instructions

## 2021-06-19 NOTE — Progress Notes (Signed)
Chief Complaint  Patient presents with   Follow-up    CAD   History of Present Illness: 71 yo female with history of CAD, hyperlipidemia and tobacco abuse here today for cardiac follow up. She was admitted to [facility] April 2011 with a NSTEMI secondary to subtotally occluded distal RCA which was treated with a drug eluting stent. I saw her in August 2017 and she c/o pain in her upper back which was her prior anginal equivalent. Cardiac cath 12/30/15 with severe stenosis mid RCA treated with a drug eluting stent. There was stable moderate disease in the mid LAD and mid Circumflex. She has not tolerated Lipitor but has tolerated Mevacor. She also did not tolerate Coreg but has tolerated low dose Toprol.   She is here today for follow up. The patient denies any chest pain, dyspnea, palpitations, lower extremity edema, orthopnea, PND, dizziness, near syncope or syncope.   Primary Care Physician: Lemmie Evens, MD   Past Medical History:  Diagnosis Date   Arthritis    Coronary artery disease    NATIVE VESSEL (ICD-414.01)-cath 08/17/09 with subtotally occluded RCA, moderate disease LAD.   Hyperlipidemia    Hypertension    Hypotension    Myocardial infarction ([place]) 08/17/2009   Tobacco user     Past Surgical History:  Procedure Laterality Date   ABDOMINAL HYSTERECTOMY  1980s   laparoscopic   CARDIAC CATHETERIZATION N/A 12/30/2015   Procedure: Left Heart Cath and Coronary Angiography;  Surgeon: Burnell Blanks, MD;  Location: [place] [facility];  Service: Cardiovascular;  Laterality: N/A;   CARDIAC CATHETERIZATION N/A 12/30/2015   Procedure: Coronary Stent Intervention;  Surgeon: Burnell Blanks, MD;  Location: [place] [facility];  Service: Cardiovascular;  Laterality: N/A;   CORONARY ANGIOPLASTY WITH STENT PLACEMENT  08/17/2009   "1 stent"   TOTAL HIP ARTHROPLASTY Right 06/17/2020   Procedure: RIGHT TOTAL HIP ARTHROPLASTY ANTERIOR APPROACH;  Surgeon: Mcarthur Rossetti,  MD;  Location: [facility];  Service: Orthopedics;  Laterality: Right;   TOTAL HIP ARTHROPLASTY Left 10/21/2020   Procedure: LEFT TOTAL HIP ARTHROPLASTY ANTERIOR APPROACH;  Surgeon: Mcarthur Rossetti, MD;  Location: [facility];  Service: Orthopedics;  Laterality: Left;   TUBAL LIGATION  ~ 1980    Current Outpatient Medications  Medication Sig Dispense Refill   ascorbic acid (VITAMIN C) 500 MG tablet Take 500 mg by mouth daily.     aspirin 81 MG chewable tablet Chew 1 tablet (81 mg total) by mouth 2 (two) times daily. 30 tablet 0   B Complex-C (B-COMPLEX WITH VITAMIN C) tablet Take 1 tablet by mouth daily.     Biotin 5000 MCG TABS Take 5,000 mcg by mouth daily.     cholecalciferol (VITAMIN D) 25 MCG (1000 UNIT) tablet Take 1,000 Units by mouth daily.     LORazepam (ATIVAN) 0.5 MG tablet Take 0.5 mg by mouth at bedtime as needed for sleep.     lovastatin (MEVACOR) 40 MG tablet Take 1 tablet (40 mg total) by mouth at bedtime. 90 tablet 3   metoprolol succinate (TOPROL XL) 25 MG 24 hr tablet Take 0.5 tablets (12.5 mg total) by mouth daily. 45 tablet 1   milk thistle 175 MG tablet Take 175 mg by mouth daily.     No current facility-administered medications for this visit.    Allergies  Allergen Reactions   Atorvastatin Other (See Comments)    Leg pain    Social History   Socioeconomic History   Marital  status: Divorced    Spouse name: Not on file   Number of children: Not on file   Years of education: Not on file   Highest education level: Not on file  Occupational History   Not on file  Tobacco Use   Smoking status: Former    Packs/day: 0.50    Years: 33.00    Pack years: 16.50    Types: Cigarettes    Quit date: 05/07/2013    Years since quitting: 8.1   Smokeless tobacco: Never  Vaping Use   Vaping Use: Never used  Substance and Sexual Activity   Alcohol use: Not Currently    Comment: 12/30/2015 "might have a drink once/year"   Drug use: No   Sexual activity: Not on file   Other Topics Concern   Not on file  Social History Narrative   The patient has a almost 1-pack per day tobacco use     history.     She occasionally has alcohol-no abuse.     No illicit drug use   Divorced, 2 children   Social Determinants of Radio broadcast assistant Strain: Not on file  Food Insecurity: Not on file  Transportation Needs: Not on file  Physical Activity: Not on file  Stress: Not on file  Social Connections: Not on file  Intimate Partner Violence: Not on file    Family History  Problem Relation Age of Onset   Heart attack Mother    Coronary artery disease Other    Heart disease Father        bypass   Heart disease Brother        bypass    Review of Systems:  As stated in the HPI and otherwise negative.   BP 124/72    Pulse 70    Ht 5\' 3"  (1.6 m)    Wt 139 lb (63 kg)    SpO2 100%    BMI 24.62 kg/m   Physical Examination:  General: Well developed, well nourished, NAD  HEENT: OP clear, mucus membranes moist  SKIN: warm, dry. No rashes. Neuro: No focal deficits  Musculoskeletal: Muscle strength 5/5 all ext  Psychiatric: Mood and affect normal  Neck: No JVD, no carotid bruits, no thyromegaly, no lymphadenopathy.  Lungs:Clear bilaterally, no wheezes, rhonci, crackles Cardiovascular: Regular rate and rhythm. No murmurs, gallops or rubs. Abdomen:Soft. Bowel sounds present. Non-tender.  Extremities: No lower extremity edema. Pulses are 2 + in the bilateral DP/PT.  EKG:  EKG is  ordered today. The ekg ordered today demonstrates sinus  Recent Labs: 10/19/2020: Hemoglobin 12.6; Platelets 295 10/22/2020: BUN 9; Creatinine, Ser 0.81; Potassium 3.6; Sodium 141   Lipid Panel    Component Value Date/Time   CHOL 142 08/24/2019 1015   TRIG 109 08/24/2019 1015   HDL 65 08/24/2019 1015   CHOLHDL 2.2 08/24/2019 1015   CHOLHDL 2.3 04/03/2016 0919   VLDL 24 04/03/2016 0919   LDLCALC 57 08/24/2019 1015   LDLDIRECT 113.7 10/15/2011 1105     Wt Readings  from Last 3 Encounters:  06/19/21 139 lb (63 kg)  06/12/21 140 lb (63.5 kg)  02/07/21 142 lb (64.4 kg)     Other studies Reviewed: Additional studies/ records that were reviewed today include: . Review of the above records demonstrates:    Assessment and Plan:   1. CAD without angina: She has no chest pain or upper back pain. Will continue ASA, statin and beta blocker.     2. Tobacco abuse, in  remission: She stopped smoking in February 2015.  3. Hyperlipidemia: LDL 57 in April 2021. Continue statin.  Check lipids and LFTs now   Current medicines are reviewed at length with the patient today.  The patient does not have concerns regarding medicines.  The following changes have been made:  no change  Labs/ tests ordered today include:   Orders Placed This Encounter  Procedures   Lipid panel   Hepatic function panel   EKG 12-Lead    Disposition:   F/U with me 12 months.   Signed, Lauree Chandler, MD 06/19/2021 10:40 AM    [facility] [address], [place],   [address] Phone: [phone]; Fax: [phone]

## 2021-07-05 ENCOUNTER — Ambulatory Visit: Payer: PPO | Admitting: Orthopaedic Surgery

## 2021-07-05 ENCOUNTER — Encounter: Payer: Self-pay | Admitting: Orthopaedic Surgery

## 2021-07-05 ENCOUNTER — Ambulatory Visit (INDEPENDENT_AMBULATORY_CARE_PROVIDER_SITE_OTHER): Payer: PPO

## 2021-07-05 DIAGNOSIS — Z96643 Presence of artificial hip joint, bilateral: Secondary | ICD-10-CM

## 2021-07-05 NOTE — Progress Notes (Signed)
The patient is a very pleasant and active 71 year old female who has had both her hips replaced.  We replaced her right hip in February 2022 and her left hip and June 2022.  She is very pleased with her range of motion and strength of both her hips.  She does feel like there is a leg length discrepancy and does wear a lift in her left shoe. ? ?Laying supine there is a leg length discrepancy with her left side shorter than the right.  Standing AP pelvis is obtained today and shows well-seated total hip arthroplasty with no complicating features.  Her leg lengths appear equal and her offsets are equal in these films but clinically she is definitely off.  Even her preoperative films before hips were replaced showed equal leg lengths but she is always been short she said on the left than the right.  I did review the operative note and we do have a longer have a ball on the left side. ? ?I did talk to her about the possibility of taking her back to the OR at some time and trying to lengthen her left side with a polyliner exchange and a longer hip ball.  She said she would hold off on that since the left is helping her and she is pain-free.  If she does have any issues she will let us know.  All questions and concerns were answered and addressed.  Follow-up can be as needed for the hips unless there are issues. ?

## 2021-08-07 ENCOUNTER — Other Ambulatory Visit: Payer: Self-pay | Admitting: Cardiovascular Disease

## 2021-10-17 ENCOUNTER — Other Ambulatory Visit: Payer: Self-pay | Admitting: Cardiovascular Disease

## 2021-11-17 ENCOUNTER — Ambulatory Visit
Admission: EM | Admit: 2021-11-17 | Discharge: 2021-11-17 | Disposition: A | Discharge status: Home / Self Care | Payer: PPO | Attending: Nurse Practitioner | Admitting: Nurse Practitioner

## 2021-11-17 DIAGNOSIS — N3001 Acute cystitis with hematuria: Secondary | ICD-10-CM

## 2021-11-17 DIAGNOSIS — R300 Dysuria: Secondary | ICD-10-CM

## 2021-11-17 LAB — POCT URINALYSIS DIP (MANUAL ENTRY)
Bilirubin, UA: NEGATIVE
Glucose, UA: NEGATIVE mg/dL
Nitrite, UA: NEGATIVE
Protein Ur, POC: 100 mg/dL — AB
Spec Grav, UA: 1.025 (ref 1.010–1.025)
Urobilinogen, UA: 0.2 E.U./dL
pH, UA: 5.5 (ref 5.0–8.0)

## 2021-11-17 MED ORDER — CEPHALEXIN 500 MG PO CAPS: 500.0000 mg | ORAL_CAPSULE | Freq: Two times a day (BID) | ORAL | 0 refills | Status: AC

## 2021-11-17 NOTE — ED Provider Notes (Signed)
[facility]    CSN: [id number] Arrival date & time: 11/17/21  1606      History   Chief Complaint Chief Complaint  Patient presents with   Dysuria    HPI Angel Rivera is a 71 y.o. female.   Patient presents with 1 day of dysuria, urinary frequency and urgency, voiding smaller amounts, and lower abdominal pain/pressure.  She denies urinary incontinence, hematuria, back pain, flank pain, fevers, nausea/vomiting.  Denies vaginal discharge.  Has tried increasing water intake without relief.  Denies antibiotic use in the past month.  Reports distant history of urinary tract infection.    Past Medical History:  Diagnosis Date   Arthritis    Coronary artery disease    NATIVE VESSEL (ICD-414.01)-cath 08/17/09 with subtotally occluded RCA, moderate disease LAD.   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   Unilateral primary osteoarthritis, right hip 03/23/2020   Fracture, [place], left, closed 09/06/17 09/23/2017   Diarrhea 05/16/2017   Encounter for HCV screening test for high risk patient 05/16/2017   Chest pain 06/17/2016   Unstable angina ([place]) 12/30/2015   HYPERCHOLESTEROLEMIA 09/09/2009   MYOCARDIAL INFARCTION, 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   CAD (coronary artery disease) 09/09/2009   HYPOTENSION 09/09/2009    Past Surgical History:  Procedure Laterality Date   ABDOMINAL HYSTERECTOMY  1980s   laparoscopic   CARDIAC CATHETERIZATION N/A 12/30/2015   Procedure: Left Heart Cath and Coronary Angiography;  Surgeon: Burnell Blanks, MD;  Location: [place] [facility];  Service: Cardiovascular;  Laterality: N/A;   CARDIAC CATHETERIZATION N/A 12/30/2015   Procedure: Coronary Stent  Intervention;  Surgeon: Burnell Blanks, MD;  Location: [place] [facility];  Service: Cardiovascular;  Laterality: N/A;   CORONARY ANGIOPLASTY WITH STENT PLACEMENT  08/17/2009   "1 stent"   TOTAL HIP ARTHROPLASTY Right 06/17/2020   Procedure: RIGHT TOTAL HIP ARTHROPLASTY ANTERIOR APPROACH;  Surgeon: Mcarthur Rossetti, MD;  Location: [facility];  Service: Orthopedics;  Laterality: Right;   TOTAL HIP ARTHROPLASTY Left 10/21/2020   Procedure: LEFT TOTAL HIP ARTHROPLASTY ANTERIOR APPROACH;  Surgeon: Mcarthur Rossetti, MD;  Location: [facility];  Service: Orthopedics;  Laterality: Left;   TUBAL LIGATION  ~ 1980    OB History   No obstetric history on file.      Home Medications    Prior to Admission medications   Medication Sig Start Date End Date Taking? Authorizing Provider  cephALEXin (KEFLEX) 500 MG capsule Take 1 capsule (500 mg total) by mouth 2 (two) times daily for 5 days. 11/17/21 11/22/21 Yes Eulogio Bear, NP  ascorbic acid (VITAMIN C) 500 MG tablet Take 500 mg by mouth daily.    [provider]  aspirin 81 MG chewable tablet Chew 1 tablet (81 mg total) by mouth 2 (two) times daily. 10/22/20   Mcarthur Rossetti, MD  B Complex-C (B-COMPLEX WITH VITAMIN C) tablet Take 1 tablet by mouth daily.    [provider]  Biotin 5000 MCG TABS Take 5,000 mcg by mouth daily.    [provider]  cholecalciferol (VITAMIN D) 25 MCG (1000 UNIT) tablet Take 1,000 Units by mouth daily.    [provider]  LORazepam (ATIVAN) 0.5 MG tablet Take 0.5 mg by mouth at bedtime as needed for sleep. 09/10/17   [provider]  lovastatin (MEVACOR) 40 MG tablet TAKE 1 TABLET(40 MG) BY MOUTH AT BEDTIME 08/07/21   Burnell Blanks, MD  metoprolol succinate (TOPROL-XL) 25 MG 24 hr tablet TAKE 1/2 TABLET(12.5 MG) BY MOUTH DAILY 10/17/21   Burnell Blanks, MD  milk thistle 175 MG tablet Take 175 mg by mouth daily.    [provider]   furosemide (LASIX) 40 MG tablet Take 1 tablet (40 mg total) by mouth daily as needed for edema. 03/24/19 10/21/20  Imogene Burn, PA-C    Family History Family History  Problem Relation Age of Onset   Heart attack Mother    Coronary artery disease Other    Heart disease Father        bypass   Heart disease Brother        bypass    Social History Social History   Tobacco Use   Smoking status: Former    Packs/day: 0.50    Years: 33.00    Total pack years: 16.50    Types: Cigarettes    Quit date: 05/07/2013    Years since quitting: 8.5   Smokeless tobacco: Never  Vaping Use   Vaping Use: Never used  Substance Use Topics   Alcohol use: Not Currently    Comment: 12/30/2015 "might have a drink once/year"   Drug use: No     Allergies   Atorvastatin   Review of Systems Review of Systems Per HPI  Physical Exam Triage Vital Signs ED Triage Vitals  Enc Vitals Group     BP 11/17/21 1616 121/75     Pulse Rate 11/17/21 1616 78     Resp 11/17/21 1616 16     Temp 11/17/21 1616 97.7 F (36.5 C)     Temp Source 11/17/21 1616 Oral     SpO2 11/17/21 1616 97 %     Weight --      Height --      Head Circumference --      Peak Flow --      Pain Score 11/17/21 1618 8     Pain Loc --      Pain Edu? --      Excl. in [place]? --    No data found.  Updated Vital Signs BP 121/75 (BP Location: Right Arm)   Pulse 78   Temp 97.7 F (36.5 C) (Oral)   Resp 16   SpO2 97%   Visual Acuity Right Eye Distance:   Left Eye Distance:   Bilateral Distance:    Right Eye Near:   Left Eye Near:    Bilateral Near:     Physical Exam Vitals and nursing note reviewed.  Constitutional:      General: She is not in acute distress.    Appearance: She is not toxic-appearing.  Pulmonary:     Effort: Pulmonary effort is normal. No respiratory distress.  Abdominal:     General: Abdomen is flat. Bowel sounds are normal. There is no distension.     Palpations: Abdomen is soft. There is no  mass.     Tenderness: There is abdominal tenderness in the suprapubic area. There is no right CVA tenderness, left CVA tenderness or guarding.  Skin:    General: Skin is warm and dry.     Capillary Refill: Capillary refill takes less than 2 seconds.     Coloration:  Skin is not jaundiced or pale.     Findings: No erythema.  Neurological:     Mental Status: She is alert and oriented to person, place, and time.  Psychiatric:        Behavior: Behavior is cooperative.      UC Treatments / Results  Labs (all labs ordered are listed, but only abnormal results are displayed) Labs Reviewed  POCT URINALYSIS DIP (MANUAL ENTRY) - Abnormal; Notable for the following components:      Result Value   Ketones, POC UA trace (5) (*)    Blood, UA moderate (*)    Protein Ur, POC =100 (*)    Leukocytes, UA Trace (*)    All other components within normal limits  URINE CULTURE    EKG   Radiology No results found.  Procedures Procedures (including critical care time)  Medications Ordered in UC Medications - No data to display  Initial Impression / Assessment and Plan / UC Course  I have reviewed the triage vital signs and the nursing notes.  Pertinent labs & imaging results that were available during my care of the patient were reviewed by me and considered in my medical decision making (see chart for details).    Patient is a very pleasant, well-appearing 71 year old female presenting for UTI symptoms today.  Urinalysis today shows moderate blood, trace leukocytes.  Will send for urine culture, in the meantime treat with Keflex 500 mg twice daily for 5 days.  Encouraged avoiding bladder irritants, increasing fluid intake over the weekend.  ER precautions discussed.  The patient was given the opportunity to ask questions.  All questions answered to their satisfaction.  The patient is in agreement to this plan.   Final Clinical Impressions(s) / UC Diagnoses   Final diagnoses:  Dysuria   Acute cystitis with hematuria     Discharge Instructions      - The urine sample today shows a little bit of blood and white blood cells; these are signs of infection - Please start Keflex twice daily for 5 days to treat UTI - We are sending the urine sample for a culture and will let you know if this comes back showing we need to change antibiotics - If your symptoms worsen over the weekend and you develop fever, chills, nausea/vomiting and unable to keep fluids down, please go emergency room    ED Prescriptions     Medication Sig Dispense Auth. Provider   cephALEXin (KEFLEX) 500 MG capsule Take 1 capsule (500 mg total) by mouth 2 (two) times daily for 5 days. 10 capsule Eulogio Bear, NP      PDMP not reviewed this encounter.   Eulogio Bear, NP 11/17/21 1702

## 2021-11-17 NOTE — Discharge Instructions (Signed)
-   The urine sample today shows a little bit of blood and white blood cells; these are signs of infection - Please start Keflex twice daily for 5 days to treat UTI - We are sending the urine sample for a culture and will let you know if this comes back showing we need to change antibiotics - If your symptoms worsen over the weekend and you develop fever, chills, nausea/vomiting and unable to keep fluids down, please go emergency room

## 2021-11-17 NOTE — ED Triage Notes (Signed)
Pt reports lower abdominal pressure and burning when urinating x 1 day.

## 2021-11-19 LAB — URINE CULTURE: Culture: NO GROWTH

## 2021-12-25 ENCOUNTER — Ambulatory Visit
Admission: EM | Admit: 2021-12-25 | Discharge: 2021-12-25 | Disposition: A | Discharge status: Home / Self Care | Payer: PPO | Attending: Family Medicine | Admitting: Family Medicine

## 2021-12-25 ENCOUNTER — Telehealth: Payer: Self-pay | Admitting: Orthopedic Surgery

## 2021-12-25 ENCOUNTER — Ambulatory Visit (INDEPENDENT_AMBULATORY_CARE_PROVIDER_SITE_OTHER): Payer: PPO

## 2021-12-25 DIAGNOSIS — M25561 Pain in right knee: Secondary | ICD-10-CM

## 2021-12-25 MED ORDER — CYCLOBENZAPRINE HCL 5 MG PO TABS: 5.0000 mg | ORAL_TABLET | Freq: Three times a day (TID) | ORAL | 0 refills | Status: DC | PRN

## 2021-12-25 MED ORDER — DEXAMETHASONE SODIUM PHOSPHATE 10 MG/ML IJ SOLN
10.0000 mg | Freq: Once | INTRAMUSCULAR | Status: AC
  Administered 2021-12-25: 10 mg via INTRAMUSCULAR

## 2021-12-25 NOTE — Telephone Encounter (Signed)
Patient called to request appointment for as soon as possible w/Dr Aline Brochure. Relayed current scheduling and that he is out of clinic today. She is aware of option of [facility]; will call us back (we are also in interim checking for any cancellations)

## 2021-12-25 NOTE — ED Provider Notes (Signed)
[facility]    CSN: [id number] Arrival date & time: 12/25/21  1545      History   Chief Complaint Chief Complaint  Patient presents with   Leg Pain    HPI Angel Rivera is a 71 y.o. female.   Patient presenting today with 1 week history of progressively worsening right knee pain, now localized more posteriorly but also wrapping around laterally and extending down into shin.  She states she started having aching while she was walking, though does not recall stepping wrong at any point during her walk.  She denies discoloration, swelling, numbness, tingling, weakness.  Has tried ice, elevation, ibuprofen, Tylenol with minimal relief.    Past Medical History:  Diagnosis Date   Arthritis    Coronary artery disease    NATIVE VESSEL (ICD-414.01)-cath 08/17/09 with subtotally occluded RCA, moderate disease LAD.   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   Unilateral primary osteoarthritis, 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   Unilateral primary osteoarthritis, right hip 03/23/2020   Fracture, [place], left, closed 09/06/17 09/23/2017   Diarrhea 05/16/2017   Encounter for HCV screening test for high risk patient 05/16/2017   Chest pain 06/17/2016   Unstable angina ([place]) 12/30/2015   HYPERCHOLESTEROLEMIA 09/09/2009   MYOCARDIAL INFARCTION, 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   CAD (coronary artery disease) 09/09/2009   HYPOTENSION 09/09/2009    Past Surgical History:  Procedure Laterality Date   ABDOMINAL HYSTERECTOMY  1980s   laparoscopic   CARDIAC CATHETERIZATION N/A 12/30/2015   Procedure: Left Heart Cath and Coronary Angiography;  Surgeon: Burnell Blanks, MD;  Location: [facility];  Service: Cardiovascular;  Laterality: N/A;   CARDIAC CATHETERIZATION  N/A 12/30/2015   Procedure: Coronary Stent Intervention;  Surgeon: Burnell Blanks, MD;  Location: [facility];  Service: Cardiovascular;  Laterality: N/A;   CORONARY ANGIOPLASTY WITH STENT PLACEMENT  08/17/2009   "1 stent"   TOTAL HIP ARTHROPLASTY Right 06/17/2020   Procedure: RIGHT TOTAL HIP ARTHROPLASTY ANTERIOR APPROACH;  Surgeon: Mcarthur Rossetti, MD;  Location: [facility];  Service: Orthopedics;  Laterality: Right;   TOTAL HIP ARTHROPLASTY Left 10/21/2020   Procedure: LEFT TOTAL HIP ARTHROPLASTY ANTERIOR APPROACH;  Surgeon: Mcarthur Rossetti, MD;  Location: [facility];  Service: Orthopedics;  Laterality: Left;   TUBAL LIGATION  ~ 1980    OB History   No obstetric history on file.      Home Medications    Prior to Admission medications   Medication Sig Start Date End Date Taking? Authorizing Provider  cyclobenzaprine (FLEXERIL) 5 MG tablet Take 1 tablet (5 mg total) by mouth 3 (three) times daily as needed for muscle spasms. Do not drink alcohol or drive while taking this medication.  May cause drowsiness. 12/25/21  Yes Volney American, PA-C  ascorbic acid (VITAMIN C) 500 MG tablet Take 500 mg by mouth daily.    [provider]  aspirin 81 MG chewable tablet Chew 1 tablet (81 mg total) by mouth 2 (two) times daily. 10/22/20   Mcarthur Rossetti, MD  B Complex-C (B-COMPLEX WITH VITAMIN C) tablet Take 1 tablet by mouth daily.    [provider]  Biotin 5000 MCG TABS Take 5,000 mcg by mouth daily.  [provider]  cholecalciferol (VITAMIN D) 25 MCG (1000 UNIT) tablet Take 1,000 Units by mouth daily.    [provider]  LORazepam (ATIVAN) 0.5 MG tablet Take 0.5 mg by mouth at bedtime as needed for sleep. 09/10/17   [provider]  lovastatin (MEVACOR) 40 MG tablet TAKE 1 TABLET(40 MG) BY MOUTH AT BEDTIME 08/07/21   Burnell Blanks, MD  metoprolol succinate (TOPROL-XL) 25 MG 24 hr tablet TAKE 1/2 TABLET(12.5  MG) BY MOUTH DAILY 10/17/21   Burnell Blanks, MD  milk thistle 175 MG tablet Take 175 mg by mouth daily.    [provider]  furosemide (LASIX) 40 MG tablet Take 1 tablet (40 mg total) by mouth daily as needed for edema. 03/24/19 10/21/20  Imogene Burn, PA-C    Family History Family History  Problem Relation Age of Onset   Heart attack Mother    Coronary artery disease Other    Heart disease Father        bypass   Heart disease Brother        bypass    Social History Social History   Tobacco Use   Smoking status: Former    Packs/day: 0.50    Years: 33.00    Total pack years: 16.50    Types: Cigarettes    Quit date: 05/07/2013    Years since quitting: 8.6   Smokeless tobacco: Never  Vaping Use   Vaping Use: Never used  Substance Use Topics   Alcohol use: Not Currently    Comment: 12/30/2015 "might have a drink once/year"   Drug use: No     Allergies   Atorvastatin   Review of Systems Review of Systems Per HPI  Physical Exam Triage Vital Signs ED Triage Vitals  Enc Vitals Group     BP 12/25/21 1554 (!) 146/75     Pulse Rate 12/25/21 1554 76     Resp 12/25/21 1554 16     Temp 12/25/21 1554 97.8 F (36.6 C)     Temp Source 12/25/21 1554 Oral     SpO2 12/25/21 1554 98 %     Weight --      Height --      Head Circumference --      Peak Flow --      Pain Score 12/25/21 1558 8     Pain Loc --      Pain Edu? --      Excl. in [place]? --    No data found.  Updated Vital Signs BP (!) 146/75 (BP Location: Right Arm)   Pulse 76   Temp 97.8 F (36.6 C) (Oral)   Resp 16   SpO2 98%   Visual Acuity Right Eye Distance:   Left Eye Distance:   Bilateral Distance:    Right Eye Near:   Left Eye Near:    Bilateral Near:     Physical Exam Vitals and nursing note reviewed.  Constitutional:      Appearance: Normal appearance. She is not ill-appearing.  HENT:     Head: Atraumatic.  Eyes:     Extraocular Movements: Extraocular movements  intact.     Conjunctiva/sclera: Conjunctivae normal.  Cardiovascular:     Rate and Rhythm: Normal rate and regular rhythm.     Heart sounds: Normal heart sounds.  Pulmonary:     Effort: Pulmonary effort is normal.     Breath sounds: Normal breath sounds.  Musculoskeletal:     Cervical back: Normal  range of motion and neck supple.     Comments: Antalgic gait, currently using a cane to help with ambulation.  Ambulatory with ease at baseline with no assistance.  Posterior knee and hamstring tenderness to palpation and anterolateral leg tenderness to palpation.  Negative McMurray's, negative drawer testing.  No joint instability on testing.  No significant edema  Skin:    General: Skin is warm and dry.  Neurological:     Mental Status: She is alert and oriented to person, place, and time.     Comments: Right lower extremity neurovascularly intact  Psychiatric:        Mood and Affect: Mood normal.        Thought Content: Thought content normal.        Judgment: Judgment normal.    UC Treatments / Results  Labs (all labs ordered are listed, but only abnormal results are displayed) Labs Reviewed - No data to display  EKG   Radiology DG Knee Complete 4 Views Right  Result Date: 12/25/2021 CLINICAL DATA:  One week history of lateral and posterior knee pain EXAM: RIGHT KNEE - COMPLETE 4+ VIEW COMPARISON:  None FINDINGS: Small amount of joint fluid in the suprapatellar recess. No weight-bearing compartment joint space narrowing. Patellofemoral compartment appears normal. No fracture or focal bone lesion. Arterial calcification incidentally noted. One could question generalized osteopenia. IMPRESSION: Small amount of joint fluid. Question osteopenia. No other abnormal bone or joint finding. Electronically Signed   By: Nelson Chimes M.D.   On: 12/25/2021 16:35    Procedures Procedures (including critical care time)  Medications Ordered in UC Medications  dexamethasone (DECADRON) injection  10 mg (10 mg Intramuscular Given 12/25/21 1703)    Initial Impression / Assessment and Plan / UC Course  I have reviewed the triage vital signs and the nursing notes.  Pertinent labs & imaging results that were available during my care of the patient were reviewed by me and considered in my medical decision making (see chart for details).     X-ray negative for acute bony abnormality, exam reassuring today.  Suspect soft tissue strain.  Treat with IM Decadron, Flexeril, compression sleeve, RICE protocol.  Return for any worsening symptoms.  Already has an appointment for next week with orthopedics for follow-up.  Final Clinical Impressions(s) / UC Diagnoses   Final diagnoses:  Acute pain of right knee   Discharge Instructions   None    ED Prescriptions     Medication Sig Dispense Auth. Provider   cyclobenzaprine (FLEXERIL) 5 MG tablet Take 1 tablet (5 mg total) by mouth 3 (three) times daily as needed for muscle spasms. Do not drink alcohol or drive while taking this medication.  May cause drowsiness. 15 tablet Volney American, [place]      PDMP not reviewed this encounter.   Volney American, [place] 12/25/21 1812

## 2021-12-25 NOTE — ED Triage Notes (Signed)
Pt presents with pain behind right knee for past week. Pt denies injury , pain radiates to shin with flexion and extension

## 2021-12-25 NOTE — Telephone Encounter (Signed)
Called back to patient to offer next available, 01/04/22 and to offer to also keep on wait list. Chart notes indicate she has gone on to [facility] in [place]. Left message.

## 2021-12-26 ENCOUNTER — Telehealth: Payer: Self-pay | Admitting: Emergency Medicine

## 2021-12-26 ENCOUNTER — Telehealth: Payer: Self-pay | Admitting: Orthopedic Surgery

## 2021-12-26 NOTE — Telephone Encounter (Signed)
Patient called this morning, she said she went to Urgent Care yesterday and they gave her an injection and this morning she tried to get out of bed, and she states her knee popped like a rubber band and she is now in excruciating pain.  She had to lay in the bed for a while before she could get out of bed.    I advised her Dr. Aline Brochure is not in the office today and it would be Thursday before he can see her.  She said she would see anyone today.  Advised patient Dr. Amedeo Kinsman doesn't have any appointments available for today.    Since she has seen Dr. Ninfa Linden at our [facility] office, she could call them and see if they have a opening today.

## 2021-12-26 NOTE — Telephone Encounter (Signed)
Pt called and stated got up this morning and felt a pop in right knee and reports pain has increased since being seen yesterday. Consulted provider at [facility] and states next steps would be ortho follow-up. Pt verbalized understanding.

## 2021-12-27 ENCOUNTER — Ambulatory Visit (HOSPITAL_COMMUNITY)
Admission: RE | Admit: 2021-12-27 | Discharge: 2021-12-27 | Disposition: A | Discharge status: Home / Self Care | Payer: PPO | Source: Ambulatory Visit | Attending: Family Medicine | Admitting: Family Medicine

## 2021-12-27 ENCOUNTER — Other Ambulatory Visit: Payer: Self-pay | Admitting: Family Medicine

## 2021-12-27 ENCOUNTER — Other Ambulatory Visit (HOSPITAL_COMMUNITY): Payer: Self-pay | Admitting: Family Medicine

## 2021-12-27 DIAGNOSIS — G8929 Other chronic pain: Secondary | ICD-10-CM | POA: Diagnosis present

## 2021-12-27 DIAGNOSIS — M25561 Pain in right knee: Secondary | ICD-10-CM | POA: Diagnosis not present

## 2022-01-04 ENCOUNTER — Encounter: Payer: Self-pay | Admitting: Orthopedic Surgery

## 2022-01-04 ENCOUNTER — Ambulatory Visit: Payer: PPO | Admitting: Orthopedic Surgery

## 2022-01-04 DIAGNOSIS — S83241A Other tear of medial meniscus, current injury, right knee, initial encounter: Secondary | ICD-10-CM

## 2022-01-04 NOTE — Progress Notes (Signed)
Chief Complaint  Patient presents with   Knee Pain    Right knee pain, for a while has had lateral knee pain.  Then Monday 8/21 was getting up out of bed, and felt a pop medially.  Went to Urgent Care.  Then saw Dr Karie Kirks and was referred for an MRI.  MRI shows small Baker's cyst and a medical meniscal tear.     70 year old female was getting in or out of bed twisted her knee felt acute pain eventually went to urgent care had an MRI which shows a torn medial meniscus however since that time she has not had as much pain now she can bend her knee where at first she could not  She is got a sleeve she is doing well she is walking she has a slight limp but is getting better  Exam shows tenderness over the medial femoral condyle and medial joint line she can bend and straighten the knee now no meniscal signs.  No effusion.  Her MRI is available for my review she has a torn medial meniscus mild arthritis medially had a small Baker's cyst  We discussed treatment options  She is doing well getting better I told her 1 of 2 things can happen it could never bother her again or it could catch and lock and at that time she should be worked in to be scheduled for surgery

## 2022-01-15 ENCOUNTER — Ambulatory Visit: Payer: PPO | Admitting: Orthopedic Surgery

## 2022-01-15 ENCOUNTER — Encounter: Payer: Self-pay | Admitting: Orthopedic Surgery

## 2022-01-15 VITALS — BP 140/68 | HR 73 | Ht 64.0 in | Wt 139.0 lb

## 2022-01-15 DIAGNOSIS — M7051 Other bursitis of knee, right knee: Secondary | ICD-10-CM

## 2022-01-15 MED ORDER — METHYLPREDNISOLONE ACETATE 40 MG/ML IJ SUSP
40.0000 mg | Freq: Once | INTRAMUSCULAR | Status: AC
  Administered 2022-01-15: 40 mg via INTRA_ARTICULAR

## 2022-01-15 NOTE — Patient Instructions (Addendum)
You have received an injection of steroids into the joint. 15% of patients will have increased pain within the 24 hours postinjection.   This is transient and will go away.   We recommend that you use ice packs on the injection site for 20 minutes every 2 hours and extra strength Tylenol 2 tablets every 8 as needed until the pain resolves.  If you continue to have pain after taking the Tylenol and using the ice please call the office for further instructions.   ICE 3 X A DAY   APPLY LINAMENT 3 X A DAY

## 2022-01-15 NOTE — Progress Notes (Signed)
Chief Complaint  Patient presents with   Knee Pain    RT knee/ pain increasing Discuss surgery   Add-on patient today.  71 year old female came to [place] on August 31 with right knee pain history and physical and imaging consistent with torn medial meniscus but she was doing well.  She was planning to go to a balloon show and wanted to walk around the week before to make sure she could tolerate it and after that she started having increased medial knee pain  The pain is located below the joint line  Her exam shows tenderness and swelling of the pes tendons consistent with pes bursitis.  This is not in the area of the meniscus.  After discussion we decided to inject the area see her in a week and if no improvement then proceed with arthroscopy  Procedure note right knee injection for bursitis   verbal consent was obtained to inject right knee PES BURSA  Timeout was completed to confirm the site of injection  The medications used were 40 mg of Depo-Medrol and 1% lidocaine 3 cc  Anesthesia was provided by ethyl chloride and the skin was prepped with alcohol.  After cleaning the skin with alcohol a 25-gauge needle was used to inject the right knee bursa.  There were no complications and a sterile bandage was   Encounter Diagnosis  Name Primary?   Pes anserinus bursitis of right knee Yes   applied

## 2022-01-22 ENCOUNTER — Ambulatory Visit: Payer: PPO | Admitting: Orthopedic Surgery

## 2022-01-22 ENCOUNTER — Encounter: Payer: Self-pay | Admitting: Orthopedic Surgery

## 2022-01-22 DIAGNOSIS — M7051 Other bursitis of knee, right knee: Secondary | ICD-10-CM | POA: Diagnosis not present

## 2022-01-22 DIAGNOSIS — S83241D Other tear of medial meniscus, current injury, right knee, subsequent encounter: Secondary | ICD-10-CM | POA: Diagnosis not present

## 2022-01-22 NOTE — Progress Notes (Signed)
Chief Complaint  Patient presents with   Knee Pain    RT KNEE//NOT ANY BETTER   FOLLOW UP   Encounter Diagnoses  Name Primary?   Pes anserinus bursitis of right knee Yes   Acute tear medial meniscus, right, subsequent encounter    Right knee pain, for a while has had lateral knee pain.  Then Monday 8/21 was getting up out of bed, and felt a pop medially.  Went to Urgent Care.  Then saw Dr Karie Kirks and was referred for an MRI.  MRI shows small Baker's cyst and a medical meniscal tear.       71 year old female was getting in or out of bed twisted her knee felt acute pain eventually went to urgent care had an MRI which shows a torn medial meniscus she initially improved but then walked on some uneven ground and the pain worsened again.  She was thought that she had aggravated her pes bursa and was injected but did not improve and as we discussed she came back to go ahead and schedule surgical intervention right knee  Allergies  Allergen Reactions   Atorvastatin Other (See Comments)    Leg pain   Current Outpatient Medications  Medication Instructions   ascorbic acid (VITAMIN C) 500 mg, Oral, Daily   aspirin 81 mg, Oral, 2 times daily   B Complex-C (B-COMPLEX WITH VITAMIN C) tablet 1 tablet, Oral, Daily   Biotin 5,000 mcg, Oral, Daily   cholecalciferol (VITAMIN D3) 1,000 Units, Oral, Daily   cyclobenzaprine (FLEXERIL) 5 mg, Oral, 3 times daily PRN, Do not drink alcohol or drive while taking this medication.  May cause drowsiness.   LORazepam (ATIVAN) 0.5 mg, Oral, At bedtime PRN   lovastatin (MEVACOR) 40 MG tablet TAKE 1 TABLET(40 MG) BY MOUTH AT BEDTIME   metoprolol succinate (TOPROL-XL) 25 MG 24 hr tablet TAKE 1/2 TABLET(12.5 MG) BY MOUTH DAILY   milk thistle 175 mg, Oral, Daily   Past Medical History:  Diagnosis Date   Arthritis    Coronary artery disease    NATIVE VESSEL (ICD-414.01)-cath 08/17/09 with subtotally occluded RCA, moderate disease LAD.   Hyperlipidemia     Hypertension    Hypotension    Myocardial infarction ([place]) 08/17/2009   Tobacco user    Past Surgical History:  Procedure Laterality Date   ABDOMINAL HYSTERECTOMY  1980s   laparoscopic   CARDIAC CATHETERIZATION N/A 12/30/2015   Procedure: Left Heart Cath and Coronary Angiography;  Surgeon: Burnell Blanks, MD;  Location: [facility];  Service: Cardiovascular;  Laterality: N/A;   CARDIAC CATHETERIZATION N/A 12/30/2015   Procedure: Coronary Stent Intervention;  Surgeon: Burnell Blanks, MD;  Location: [facility];  Service: Cardiovascular;  Laterality: N/A;   CORONARY ANGIOPLASTY WITH STENT PLACEMENT  08/17/2009   "1 stent"   TOTAL HIP ARTHROPLASTY Right 06/17/2020   Procedure: RIGHT TOTAL HIP ARTHROPLASTY ANTERIOR APPROACH;  Surgeon: Mcarthur Rossetti, MD;  [facility]: [facility];  Service: Orthopedics;  Laterality: Right;   TOTAL HIP ARTHROPLASTY Left 10/21/2020   Procedure: LEFT TOTAL HIP ARTHROPLASTY ANTERIOR APPROACH;  Surgeon: Mcarthur Rossetti, MD;  Location: [facility];  Service: Orthopedics;  Laterality: Left;   TUBAL LIGATION  ~ 1980   Social History   Tobacco Use   Smoking status: Former    Packs/day: 0.50    Years: 33.00    Total pack years: 16.50    Types: Cigarettes    Quit date: 05/07/2013    Years since quitting:  8.7   Smokeless tobacco: Never  Vaping Use   Vaping Use: Never used  Substance Use Topics   Alcohol use: Not Currently    Comment: 12/30/2015 "might have a drink once/year"   Drug use: No   Family History  Problem Relation Age of Onset   Heart attack Mother    Coronary artery disease Other    Heart disease Father        bypass   Heart disease Brother        bypass   Constitutional: Vital signs, General appearance normal  Cardiovascular: No evidence of swelling or varicosities pulses are intact extremity temperature warm no edema no tenderness  Gait is antalgic.  Right knee skin is normal  Neuro coordination, deep  tendon reflexes, sensation  Exam for psychiatric system normal  Right knee 2 areas of tenderness 1 posterior medial joint line 1 pes tendons.  No effusion normal range of motion positive McMurray's sign strength and muscle tone normal ligaments stable     Still hurting pretty badly after the injection  As previously discussed the patient did not improve with the injection and the pes bursa that is actually still bothering her as is the medial meniscal tear so we will proceed with the surgery as listed below right  SARK MEDIAL MENISECTOMY   The procedure has been fully reviewed with the patient; The risks and benefits of surgery have been discussed and explained and understood. Alternative treatment has also been reviewed, questions were encouraged and answered. The postoperative plan is also been reviewed.

## 2022-01-22 NOTE — Patient Instructions (Addendum)
Your surgery will be at [facility] by Dr Harrison  The hospital will contact you with a preoperative appointment to discuss Anesthesia.  Please arrive on time or 15 minutes early for the preoperative appointment, they have a very tight schedule if you are late or do not come in your surgery will be cancelled.  The phone number is [phone]. Please bring your medications with you for the appointment. They will tell you the arrival time and medication instructions when you have your preoperative evaluation. Do not wear nail polish the day of your surgery and if you take Phentermine you need to stop this medication ONE WEEK prior to your surgery. If you take Invokana, Farxiga, Jardiance, or Steglatro) - Hold 72 hours before the procedure.  If you take Ozempic,  Bydureon or Trulicity do not take for 8 days before your surgery. If you take Victoza, Rybelsis, Saxenda or Adlyxi stop 24 hours before the procedure.  Please arrive at the hospital 2 hours before procedure if scheduled at 9:30 or later in the day or at the time the nurse tells you at your preoperative visit.   If you have my chart do not use the time given in my chart use the time given to you by the nurse during your preoperative visit.   Your surgery  time may change. Please be available for phone calls the day of your surgery and the day before. The Short Stay department may need to discuss changes about your surgery time. Not reaching the you could lead to procedure delays and possible cancellation.  You must have a ride home and someone to stay with you for 24 to 48 hours. The person taking you home will receive and sign for the your discharge instructions.  Please be prepared to give your support person's name and telephone number to Central Registration. Dr Harrison will need that name and phone number post procedure.    Meniscus Injury, Arthroscopy   Arthroscopy is a surgical procedure that involves the use of a small scope that  has a camera and surgical instruments on the end (arthroscope). An arthroscope can be used to repair your meniscus injury.  LET YOUR HEALTH CARE PROVIDER KNOW ABOUT: Any allergies you have. All medicines you are taking, including vitamins, herbs, eyedrops, creams, and over-the-counter medicines. Any recent colds or infections you have had or currently have. Previous problems you or members of your family have had with the use of anesthetics. Any blood disorders or blood clotting problems you have. Previous surgeries you have had. Medical conditions you have. RISKS AND COMPLICATIONS Generally, this is a safe procedure. However, as with any procedure, problems can occur. Possible problems include: Damage to nerves or blood vessels. Excess bleeding. Blood clots. Infection. BEFORE THE PROCEDURE Do not eat or drink for 6-8 hours before the procedure. Take medicines as directed by your surgeon. Ask your surgeon about changing or stopping your regular medicines. You may have lab tests the morning of surgery. PROCEDURE  You will be given one of the following:  A medicine that numbs the area (local anesthesia). A medicine that makes you go to sleep (general anesthesia). A medicine injected into your spine that numbs your body below the waist (spinal anesthesia). Most often, several small cuts (incisions) are made in the knee. The arthroscope and instruments go into the incisions to repair the damage. The torn portion of the meniscus is removed.   AFTER THE PROCEDURE You will be taken to the recovery   area where your progress will be monitored. When you are awake, stable, and taking fluids without complications, you will be allowed to go home. This is usually the same day. A torn or stretched ligament (ligament sprain) may take 6-8 weeks to heal.  It takes about the 4-6 WEEKS if your surgeon removed a torn meniscus. A repaired meniscus may require 6-12 weeks of recovery time. A torn ligament  needing reconstructive surgery may take 6-12 months to heal fully.   This information is not intended to replace advice given to you by your health care provider. Make sure you discuss any questions you have with your health care provider. You have decided to proceed with operative arthroscopy of the knee. You have decided not to continue with nonoperative measures such as but not limited to oral medication, weight loss, activity modification, physical therapy, bracing, or injection.  We will perform operative arthroscopy of the knee. Some of the risks associated with arthroscopic surgery of the knee include but are not limited to Bleeding Infection Swelling Stiffness Blood clot Pain Need for knee replacement surgery    In compliance with recent [place] law in federal regulation regarding opioid use and abuse and addiction, we will taper (stop) opioid medication after 2 weeks.  If you're not comfortable with these risks and would like to continue with nonoperative treatment please let Dr. Harrison know prior to your surgery.  

## 2022-01-24 NOTE — Patient Instructions (Signed)
Angel Rivera  01/24/2022     '@PREFPERIOPPHARMACY'$ @   Your procedure is scheduled on  01/30/2022.   Report to [facility] at  [place]  A.[facility].   Call this number if you have problems the morning of surgery:  [phone]   Remember:  Do not eat or drink after midnight.      Take these medicines the morning of surgery with A SIP OF WATER                                           norco(if needed), metoprolol.     Do not wear jewelry, make-up or nail polish.  Do not wear lotions, powders, or perfumes, or deodorant.  Do not shave 48 hours prior to surgery.  Men may shave face and neck.  Do not bring valuables to the hospital.  [facility] is not responsible for any belongings or valuables.  Contacts, dentures or bridgework may not be worn into surgery.  Leave your suitcase in the car.  After surgery it may be brought to your room.  For patients admitted to the hospital, discharge time will be determined by your treatment team.  Patients discharged the day of surgery will not be allowed to drive home and must have someone with them for 24 hours.    Special instructions:   DO NOT smoke tobacco or vape for 24 hours before your procedure.  Please read over the following fact sheets that you were given. Coughing and Deep Breathing, Surgical Site Infection Prevention, Anesthesia Post-op Instructions, and Care and Recovery After Surgery      Arthroscopic Knee Ligament Repair, Care After This sheet gives you information about how to care for yourself after your procedure. Your health care provider may also give you more specific instructions. If you have problems or questions, contact your health care provider. What can I expect after the procedure? After the procedure, it is common to have: Soreness or pain in your knee. Bruising and swelling on your knee, calf, and ankle for 3-4 days. A small amount of fluid coming from the incisions. Follow these instructions at  home: Medicines Take over-the-counter and prescription medicines only as told by your health care provider. Ask your health care provider if the medicine prescribed to you: Requires you to avoid driving or using machinery. Can cause constipation. You may need to take these actions to prevent or treat constipation: Drink enough fluid to keep your urine pale yellow. Take over-the-counter or prescription medicines. Eat foods that are high in fiber, such as beans, whole grains, and fresh fruits and vegetables. Limit foods that are high in fat and processed sugars, such as fried or sweet foods. If you have a brace or immobilizer: Wear it as told by your health care provider. Remove it only as told by your health care provider. Loosen it if your toes tingle, become numb, or turn cold and blue. Keep it clean and dry. Ask your health care provider when it is safe to drive. Bathing Do not take baths, swim, or use a hot tub until your health care provider approves. Keep your bandage (dressing) dry until your health care provider says that it can be removed. If the brace or immobilizer is not waterproof: Do not let it get wet. Cover it with a watertight covering when you take a  bath or shower. Incision care  Follow instructions from your health care provider about how to take care of your incisions. Make sure you: Wash your hands with soap and water for at least 20 seconds before and after you change your dressing. If soap and water are not available, use hand sanitizer. Change your dressing as told by your health care provider. Leave stitches (sutures), skin glue, or adhesive strips in place. These skin closures may need to stay in place for 2 weeks or longer. If adhesive strip edges start to loosen and curl up, you may trim the loose edges. Do not remove adhesive strips completely unless your health care provider tells you to do that. Check your incision areas every day for signs of infection.  Check for: Redness. More swelling or pain. Blood or more fluid. Warmth. Pus or a bad smell. Managing pain, stiffness, and swelling  If directed, put ice on the affected area. To do this: If you have a removable brace or immobilizer, remove it as told by your health care provider. Put ice in a plastic bag. Place a towel between your skin and the bag. Leave the ice on for 20 minutes, 2-3 times a day. Remove the ice if your skin turns bright red. This is very important. If you cannot feel pain, heat, or cold, you have a greater risk of damage to the area. Move your toes often to reduce stiffness and swelling. Raise (elevate) the injured area above the level of your heart while you are sitting or lying down. Activity Do not use your knee to support your body weight until your health care provider says that you can. Use crutches or other devices as told by your health care provider. Do physical therapy exercises as told by your health care provider. Physical therapy will help you regain movement and strength in your knee. Follow instructions from your health care provider about: When you may start motion exercises. When you may start riding a stationary bike and doing other low-impact activities. When you may start to jog and do other high-impact activities. Do not lift anything that is heavier than 10 lb (4.5 kg), or the limit that you are told, until your health care provider says that it is safe. Ask your health care provider what activities are safe for you. General instructions Do not use any products that contain nicotine or tobacco, such as cigarettes, e-cigarettes, and chewing tobacco. These can delay healing. If you need help quitting, ask your health care provider. Wear compression stockings as told by your health care provider. These stockings help to prevent blood clots and reduce swelling in your legs. Keep all follow-up visits. This is important. Contact a health care provider  if: You have any of these signs of infection: Redness around an incision. Blood or more fluid coming from an incision. Warmth coming from an incision. Pus or a bad smell coming from an incision. More swelling or pain in your knee. A fever or chills. You have pain that does not get better with medicine. Your incision opens up. Get help right away if: You have trouble breathing. You have chest pain. You have increased pain or swelling in your calf or at the back of your knee. You have numbness and tingling near the knee joint or in the foot, ankle, or toes. You notice that your foot or toes look darker than normal or are cooler than normal. These symptoms may represent a serious problem that is an emergency. Do  not wait to see if the symptoms will go away. Get medical help right away. Call your local emergency services (911 in the [place] Do not drive yourself to the hospital. Summary After the procedure, it is common to have knee pain with bruising and swelling on your knee, calf, and ankle. Icing your knee and raising your leg above the level of your heart will help control the pain and swelling. Do physical therapy exercises as told by your health care provider. Physical therapy will help you regain movement and strength in your knee. This information is not intended to replace advice given to you by your health care provider. Make sure you discuss any questions you have with your health care provider. Document Revised: 09/21/2019 Document Reviewed: 09/21/2019 Elsevier Patient Education  [place] Anesthesia, Adult, Care After The following information offers guidance on how to care for yourself after your procedure. Your health care provider may also give you more specific instructions. If you have problems or questions, contact your health care provider. What can I expect after the procedure? After the procedure, it is common for people to: Have pain or discomfort at  the IV site. Have nausea or vomiting. Have a sore throat or hoarseness. Have trouble concentrating. Feel cold or chills. Feel weak, sleepy, or tired (fatigue). Have soreness and body aches. These can affect parts of the body that were not involved in surgery. Follow these instructions at home: For the time period you were told by your health care provider:  Rest. Do not participate in activities where you could fall or become injured. Do not drive or use machinery. Do not drink alcohol. Do not take sleeping pills or medicines that cause drowsiness. Do not make important decisions or sign legal documents. Do not take care of children on your own. General instructions Drink enough fluid to keep your urine pale yellow. If you have sleep apnea, surgery and certain medicines can increase your risk for breathing problems. Follow instructions from your health care provider about wearing your sleep device: Anytime you are sleeping, including during daytime naps. While taking prescription pain medicines, sleeping medicines, or medicines that make you drowsy. Return to your normal activities as told by your health care provider. Ask your health care provider what activities are safe for you. Take over-the-counter and prescription medicines only as told by your health care provider. Do not use any products that contain nicotine or tobacco. These products include cigarettes, chewing tobacco, and vaping devices, such as e-cigarettes. These can delay incision healing after surgery. If you need help quitting, ask your health care provider. Contact a health care provider if: You have nausea or vomiting that does not get better with medicine. You vomit every time you eat or drink. You have pain that does not get better with medicine. You cannot urinate or have bloody urine. You develop a skin rash. You have a fever. Get help right away if: You have trouble breathing. You have chest pain. You vomit  blood. These symptoms may be an emergency. Get help right away. Call 911. Do not wait to see if the symptoms will go away. Do not drive yourself to the hospital. Summary After the procedure, it is common to have a sore throat, hoarseness, nausea, vomiting, or to feel weak, sleepy, or fatigue. For the time period you were told by your health care provider, do not drive or use machinery. Get help right away if you have difficulty breathing, have chest pain, or  vomit blood. These symptoms may be an emergency. This information is not intended to replace advice given to you by your health care provider. Make sure you discuss any questions you have with your health care provider. Document Revised: 07/21/2021 Document Reviewed: 07/21/2021 Elsevier Patient Education  [place]. How to Use Chlorhexidine Before Surgery Chlorhexidine gluconate (CHG) is a germ-killing (antiseptic) solution that is used to clean the skin. It can get rid of the bacteria that normally live on the skin and can keep them away for about 24 hours. To clean your skin with CHG, you may be given: A CHG solution to use in the shower or as part of a sponge bath. A prepackaged cloth that contains CHG. Cleaning your skin with CHG may help lower the risk for infection: While you are staying in the intensive care unit of the hospital. If you have a vascular access, such as a central line, to provide short-term or long-term access to your veins. If you have a catheter to drain urine from your bladder. If you are on a ventilator. A ventilator is a machine that helps you breathe by moving air in and out of your lungs. After surgery. What are the risks? Risks of using CHG include: A skin reaction. Hearing loss, if CHG gets in your ears and you have a perforated eardrum. Eye injury, if CHG gets in your eyes and is not rinsed out. The CHG product catching fire. Make sure that you avoid smoking and flames after applying CHG to your  skin. Do not use CHG: If you have a chlorhexidine allergy or have previously reacted to chlorhexidine. On babies younger than 52 months of age. How to use CHG solution Use CHG only as told by your health care provider, and follow the instructions on the label. Use the full amount of CHG as directed. Usually, this is one bottle. During a shower Follow these steps when using CHG solution during a shower (unless your health care provider gives you different instructions): Start the shower. Use your normal soap and shampoo to wash your face and hair. Turn off the shower or move out of the shower stream. Pour the CHG onto a clean washcloth. Do not use any type of brush or rough-edged sponge. Starting at your neck, lather your body down to your toes. Make sure you follow these instructions: If you will be having surgery, pay special attention to the part of your body where you will be having surgery. Scrub this area for at least 1 minute. Do not use CHG on your head or face. If the solution gets into your ears or eyes, rinse them well with water. Avoid your genital area. Avoid any areas of skin that have broken skin, cuts, or scrapes. Scrub your back and under your arms. Make sure to wash skin folds. Let the lather sit on your skin for 1-2 minutes or as long as told by your health care provider. Thoroughly rinse your entire body in the shower. Make sure that all body creases and crevices are rinsed well. Dry off with a clean towel. Do not put any substances on your body afterward--such as powder, lotion, or perfume--unless you are told to do so by your health care provider. Only use lotions that are recommended by the manufacturer. Put on clean clothes or pajamas. If it is the night before your surgery, sleep in clean sheets.  During a sponge bath Follow these steps when using CHG solution during a sponge bath (unless  your health care provider gives you different instructions): Use your normal  soap and shampoo to wash your face and hair. Pour the CHG onto a clean washcloth. Starting at your neck, lather your body down to your toes. Make sure you follow these instructions: If you will be having surgery, pay special attention to the part of your body where you will be having surgery. Scrub this area for at least 1 minute. Do not use CHG on your head or face. If the solution gets into your ears or eyes, rinse them well with water. Avoid your genital area. Avoid any areas of skin that have broken skin, cuts, or scrapes. Scrub your back and under your arms. Make sure to wash skin folds. Let the lather sit on your skin for 1-2 minutes or as long as told by your health care provider. Using a different clean, wet washcloth, thoroughly rinse your entire body. Make sure that all body creases and crevices are rinsed well. Dry off with a clean towel. Do not put any substances on your body afterward--such as powder, lotion, or perfume--unless you are told to do so by your health care provider. Only use lotions that are recommended by the manufacturer. Put on clean clothes or pajamas. If it is the night before your surgery, sleep in clean sheets. How to use CHG prepackaged cloths Only use CHG cloths as told by your health care provider, and follow the instructions on the label. Use the CHG cloth on clean, dry skin. Do not use the CHG cloth on your head or face unless your health care provider tells you to. When washing with the CHG cloth: Avoid your genital area. Avoid any areas of skin that have broken skin, cuts, or scrapes. Before surgery Follow these steps when using a CHG cloth to clean before surgery (unless your health care provider gives you different instructions): Using the CHG cloth, vigorously scrub the part of your body where you will be having surgery. Scrub using a back-and-forth motion for 3 minutes. The area on your body should be completely wet with CHG when you are done  scrubbing. Do not rinse. Discard the cloth and let the area air-dry. Do not put any substances on the area afterward, such as powder, lotion, or perfume. Put on clean clothes or pajamas. If it is the night before your surgery, sleep in clean sheets.  For general bathing Follow these steps when using CHG cloths for general bathing (unless your health care provider gives you different instructions). Use a separate CHG cloth for each area of your body. Make sure you wash between any folds of skin and between your fingers and toes. Wash your body in the following order, switching to a new cloth after each step: The front of your neck, shoulders, and chest. Both of your arms, under your arms, and your hands. Your stomach and groin area, avoiding the genitals. Your right leg and foot. Your left leg and foot. The back of your neck, your back, and your buttocks. Do not rinse. Discard the cloth and let the area air-dry. Do not put any substances on your body afterward--such as powder, lotion, or perfume--unless you are told to do so by your health care provider. Only use lotions that are recommended by the manufacturer. Put on clean clothes or pajamas. Contact a health care provider if: Your skin gets irritated after scrubbing. You have questions about using your solution or cloth. You swallow any chlorhexidine. Call your local poison control center (  1-(629) 621-3046 in the [place] Get help right away if: Your eyes itch badly, or they become very red or swollen. Your skin itches badly and is red or swollen. Your hearing changes. You have trouble seeing. You have swelling or tingling in your mouth or throat. You have trouble breathing. These symptoms may represent a serious problem that is an emergency. Do not wait to see if the symptoms will go away. Get medical help right away. Call your local emergency services (911 in the [place] Do not drive yourself to the hospital. Summary Chlorhexidine  gluconate (CHG) is a germ-killing (antiseptic) solution that is used to clean the skin. Cleaning your skin with CHG may help to lower your risk for infection. You may be given CHG to use for bathing. It may be in a bottle or in a prepackaged cloth to use on your skin. Carefully follow your health care provider's instructions and the instructions on the product label. Do not use CHG if you have a chlorhexidine allergy. Contact your health care provider if your skin gets irritated after scrubbing. This information is not intended to replace advice given to you by your health care provider. Make sure you discuss any questions you have with your health care provider. Document Revised: 08/21/2021 Document Reviewed: 07/04/2020 Elsevier Patient Education  [place].

## 2022-01-26 ENCOUNTER — Encounter (HOSPITAL_COMMUNITY)
Admission: RE | Admit: 2022-01-26 | Discharge: 2022-01-26 | Disposition: A | Discharge status: Home / Self Care | Payer: PPO | Source: Ambulatory Visit | Attending: Orthopedic Surgery | Admitting: Orthopedic Surgery

## 2022-01-26 DIAGNOSIS — Z01818 Encounter for other preprocedural examination: Secondary | ICD-10-CM | POA: Insufficient documentation

## 2022-01-26 DIAGNOSIS — S83241D Other tear of medial meniscus, current injury, right knee, subsequent encounter: Secondary | ICD-10-CM | POA: Insufficient documentation

## 2022-01-26 LAB — CBC WITH DIFFERENTIAL/PLATELET
Abs Immature Granulocytes: 0.03 10*3/uL (ref 0.00–0.07)
Basophils Absolute: 0.1 10*3/uL (ref 0.0–0.1)
Basophils Relative: 1 %
Eosinophils Absolute: 0.1 10*3/uL (ref 0.0–0.5)
Eosinophils Relative: 1 %
HCT: 38.3 % (ref 36.0–46.0)
Hemoglobin: 12.7 g/dL (ref 12.0–15.0)
Immature Granulocytes: 0 %
Lymphocytes Relative: 33 %
Lymphs Abs: 3.2 10*3/uL (ref 0.7–4.0)
MCH: 32.5 pg (ref 26.0–34.0)
MCHC: 33.2 g/dL (ref 30.0–36.0)
MCV: 98 fL (ref 80.0–100.0)
Monocytes Absolute: 0.6 10*3/uL (ref 0.1–1.0)
Monocytes Relative: 6 %
Neutro Abs: 5.7 10*3/uL (ref 1.7–7.7)
Neutrophils Relative %: 59 %
Platelets: 269 10*3/uL (ref 150–400)
RBC: 3.91 MIL/uL (ref 3.87–5.11)
RDW: 12.6 % (ref 11.5–15.5)
WBC: 9.7 10*3/uL (ref 4.0–10.5)
nRBC: 0 % (ref 0.0–0.2)

## 2022-01-26 LAB — BASIC METABOLIC PANEL
Anion gap: 9 (ref 5–15)
BUN: 27 mg/dL — ABNORMAL HIGH (ref 8–23)
CO2: 23 mmol/L (ref 22–32)
Calcium: 9 mg/dL (ref 8.9–10.3)
Chloride: 108 mmol/L (ref 98–111)
Creatinine, Ser: 1.34 mg/dL — ABNORMAL HIGH (ref 0.44–1.00)
GFR, Estimated: 42 mL/min — ABNORMAL LOW (ref >60)
Glucose, Bld: 96 mg/dL (ref 70–99)
Potassium: 4 mmol/L (ref 3.5–5.1)
Sodium: 140 mmol/L (ref 135–145)

## 2022-01-29 NOTE — H&P (Signed)
Outpatient surgery history and physical   Chief Complaint  Patient presents with   Knee Pain      RT KNEE//NOT ANY BETTER    FOLLOW UP        Encounter Diagnoses  Name Primary?   Pes anserinus bursitis of right knee Yes   Acute tear medial meniscus, right, subsequent encounter      Right knee pain, for a while has had lateral knee pain.  Then Monday 8/21 was getting up out of bed, and felt a pop medially.  Went to Urgent Care.  Then saw Dr Karie Kirks and was referred for an MRI.  MRI shows small Baker's cyst and a medical meniscal tear.       71 year old female was getting in or out of bed twisted her knee felt acute pain eventually went to urgent care had an MRI which shows a torn medial meniscus she initially improved but then walked on some uneven ground and the pain worsened again.  She was thought that she had aggravated her pes bursa and was injected but did not improve and as we discussed she came back to go ahead and schedule surgical intervention right knee        Allergies  Allergen Reactions   Atorvastatin Other (See Comments)      Leg pain        Current Outpatient Medications  Medication Instructions   ascorbic acid (VITAMIN C) 500 mg, Oral, Daily   aspirin 81 mg, Oral, 2 times daily   B Complex-C (B-COMPLEX WITH VITAMIN C) tablet 1 tablet, Oral, Daily   Biotin 5,000 mcg, Oral, Daily   cholecalciferol (VITAMIN D3) 1,000 Units, Oral, Daily   cyclobenzaprine (FLEXERIL) 5 mg, Oral, 3 times daily PRN, Do not drink alcohol or drive while taking this medication.  May cause drowsiness.   LORazepam (ATIVAN) 0.5 mg, Oral, At bedtime PRN   lovastatin (MEVACOR) 40 MG tablet TAKE 1 TABLET(40 MG) BY MOUTH AT BEDTIME   metoprolol succinate (TOPROL-XL) 25 MG 24 hr tablet TAKE 1/2 TABLET(12.5 MG) BY MOUTH DAILY   milk thistle 175 mg, Oral, Daily        Past Medical History:  Diagnosis Date   Arthritis     Coronary artery disease      NATIVE VESSEL (ICD-414.01)-cath 08/17/09  with subtotally occluded RCA, moderate disease LAD.   Hyperlipidemia     Hypertension     Hypotension     Myocardial infarction ([place]) 08/17/2009   Tobacco user           Past Surgical History:  Procedure Laterality Date   ABDOMINAL HYSTERECTOMY   1980s    laparoscopic   CARDIAC CATHETERIZATION N/A 12/30/2015    Procedure: Left Heart Cath and Coronary Angiography;  Surgeon: Burnell Blanks, MD;  Location: [facility];  Service: Cardiovascular;  Laterality: N/A;   CARDIAC CATHETERIZATION N/A 12/30/2015    Procedure: Coronary Stent Intervention;  Surgeon: Burnell Blanks, MD;  Location: [facility];  Service: Cardiovascular;  Laterality: N/A;   CORONARY ANGIOPLASTY WITH STENT PLACEMENT   08/17/2009    "1 stent"   TOTAL HIP ARTHROPLASTY Right 06/17/2020    Procedure: RIGHT TOTAL HIP ARTHROPLASTY ANTERIOR APPROACH;  Surgeon: Mcarthur Rossetti, MD;  Location: [facility];  Service: Orthopedics;  Laterality: Right;   TOTAL HIP ARTHROPLASTY Left 10/21/2020    Procedure: LEFT TOTAL HIP ARTHROPLASTY ANTERIOR APPROACH;  Surgeon: Mcarthur Rossetti, MD;  Location: [facility];  Service: Orthopedics;  Laterality: Left;   TUBAL LIGATION   ~ 1980    Social History         Tobacco Use   Smoking status: Former      Packs/day: 0.50      Years: 33.00      Total pack years: 16.50      Types: Cigarettes      Quit date: 05/07/2013      Years since quitting: 8.7   Smokeless tobacco: Never  Vaping Use   Vaping Use: Never used  Substance Use Topics   Alcohol use: Not Currently      Comment: 12/30/2015 "might have a drink once/year"   Drug use: No         Family History  Problem Relation Age of Onset   Heart attack Mother     Coronary artery disease Other     Heart disease Father          bypass   Heart disease Brother          bypass    Constitutional: Vital signs, General appearance normal  Cardiovascular: No evidence of swelling or varicosities pulses are intact  extremity temperature warm no edema no tenderness   Gait is antalgic.   Right knee skin is normal  Neuro coordination, deep tendon reflexes, sensation  Exam for psychiatric system normal   Right knee 2 areas of tenderness 1 posterior medial joint line 1 pes tendons.  No effusion normal range of motion positive McMurray's sign strength and muscle tone normal ligaments stable         Still hurting pretty badly after the injection   As previously discussed the patient did not improve with the injection and the pes bursa that is actually still bothering her as is the medial meniscal tear so we will proceed with the surgery as listed below right   SARK MEDIAL MENISECTOMY    The procedure has been fully reviewed with the patient; The risks and benefits of surgery have been discussed and explained and understood. Alternative treatment has also been reviewed, questions were encouraged and answered. The postoperative plan is also been reviewed.

## 2022-01-30 ENCOUNTER — Other Ambulatory Visit: Payer: Self-pay

## 2022-01-30 ENCOUNTER — Encounter (HOSPITAL_COMMUNITY)
Admission: RE | Disposition: A | Discharge status: Home / Self Care | Payer: Self-pay | Source: Home / Self Care | Attending: Orthopedic Surgery

## 2022-01-30 ENCOUNTER — Ambulatory Visit (HOSPITAL_COMMUNITY): Payer: PPO | Admitting: Certified Registered"

## 2022-01-30 ENCOUNTER — Ambulatory Visit (HOSPITAL_COMMUNITY)
Admission: RE | Admit: 2022-01-30 | Discharge: 2022-01-30 | Disposition: A | Discharge status: Home / Self Care | Payer: PPO | Attending: Orthopedic Surgery | Admitting: Orthopedic Surgery

## 2022-01-30 ENCOUNTER — Encounter: Payer: Self-pay | Admitting: Orthopedic Surgery

## 2022-01-30 ENCOUNTER — Ambulatory Visit (HOSPITAL_BASED_OUTPATIENT_CLINIC_OR_DEPARTMENT_OTHER): Payer: PPO | Admitting: Certified Registered"

## 2022-01-30 ENCOUNTER — Encounter (HOSPITAL_COMMUNITY): Payer: Self-pay | Admitting: Orthopedic Surgery

## 2022-01-30 DIAGNOSIS — I10 Essential (primary) hypertension: Secondary | ICD-10-CM | POA: Diagnosis not present

## 2022-01-30 DIAGNOSIS — S83241A Other tear of medial meniscus, current injury, right knee, initial encounter: Secondary | ICD-10-CM | POA: Diagnosis not present

## 2022-01-30 DIAGNOSIS — I2510 Atherosclerotic heart disease of native coronary artery without angina pectoris: Secondary | ICD-10-CM

## 2022-01-30 DIAGNOSIS — M1711 Unilateral primary osteoarthritis, right knee: Secondary | ICD-10-CM | POA: Diagnosis not present

## 2022-01-30 DIAGNOSIS — Z87891 Personal history of nicotine dependence: Secondary | ICD-10-CM | POA: Diagnosis not present

## 2022-01-30 DIAGNOSIS — Z955 Presence of coronary angioplasty implant and graft: Secondary | ICD-10-CM | POA: Insufficient documentation

## 2022-01-30 DIAGNOSIS — M898X8 Other specified disorders of bone, other site: Secondary | ICD-10-CM | POA: Diagnosis not present

## 2022-01-30 DIAGNOSIS — X58XXXA Exposure to other specified factors, initial encounter: Secondary | ICD-10-CM | POA: Diagnosis not present

## 2022-01-30 DIAGNOSIS — S83241D Other tear of medial meniscus, current injury, right knee, subsequent encounter: Secondary | ICD-10-CM

## 2022-01-30 HISTORY — PX: KNEE ARTHROSCOPY WITH MEDIAL MENISECTOMY: SHX5651

## 2022-01-30 SURGERY — ARTHROSCOPY, KNEE, WITH MEDIAL MENISCECTOMY
Anesthesia: General | Site: Knee | Laterality: Right

## 2022-01-30 MED ORDER — SUGAMMADEX SODIUM 500 MG/5ML IV SOLN
INTRAVENOUS | Status: AC
  Filled 2022-01-30: qty 10

## 2022-01-30 MED ORDER — IBUPROFEN 400 MG PO TABS
400.0000 mg | ORAL_TABLET | Freq: Once | ORAL | Status: AC
  Administered 2022-01-30: 400 mg via ORAL
  Filled 2022-01-30: qty 1

## 2022-01-30 MED ORDER — PROPOFOL 10 MG/ML IV BOLUS
INTRAVENOUS | Status: DC | PRN
  Administered 2022-01-30: 200 mg via INTRAVENOUS

## 2022-01-30 MED ORDER — LIDOCAINE HCL (PF) 2 % IJ SOLN
INTRAMUSCULAR | Status: AC
  Filled 2022-01-30: qty 5

## 2022-01-30 MED ORDER — SODIUM CHLORIDE 0.9 % IR SOLN
Status: DC | PRN
  Administered 2022-01-30 (×2): 3000 mL

## 2022-01-30 MED ORDER — ONDANSETRON HCL 4 MG/2ML IJ SOLN
INTRAMUSCULAR | Status: DC | PRN
  Administered 2022-01-30: 4 mg via INTRAVENOUS

## 2022-01-30 MED ORDER — CEFAZOLIN SODIUM-DEXTROSE 2-4 GM/100ML-% IV SOLN
INTRAVENOUS | Status: AC
  Filled 2022-01-30: qty 100

## 2022-01-30 MED ORDER — HYDROCODONE-ACETAMINOPHEN 5-325 MG PO TABS: 1.0000 | ORAL_TABLET | ORAL | 0 refills | Status: DC | PRN

## 2022-01-30 MED ORDER — ACETAMINOPHEN 500 MG PO TABS
500.0000 mg | ORAL_TABLET | Freq: Once | ORAL | Status: AC
  Administered 2022-01-30: 500 mg via ORAL
  Filled 2022-01-30: qty 1

## 2022-01-30 MED ORDER — IBUPROFEN 800 MG PO TABS: 800.0000 mg | ORAL_TABLET | Freq: Three times a day (TID) | ORAL | 1 refills | Status: DC | PRN

## 2022-01-30 MED ORDER — HYDROCODONE-ACETAMINOPHEN 5-325 MG PO TABS
1.0000 | ORAL_TABLET | Freq: Once | ORAL | Status: AC
  Administered 2022-01-30: 1 via ORAL
  Filled 2022-01-30: qty 1

## 2022-01-30 MED ORDER — OXYCODONE HCL 5 MG PO TABS: 5.0000 mg | ORAL_TABLET | Freq: Once | ORAL | Status: DC | PRN

## 2022-01-30 MED ORDER — BUPIVACAINE-EPINEPHRINE (PF) 0.5% -1:200000 IJ SOLN
INTRAMUSCULAR | Status: AC
  Filled 2022-01-30: qty 60

## 2022-01-30 MED ORDER — DEXAMETHASONE SODIUM PHOSPHATE 10 MG/ML IJ SOLN
INTRAMUSCULAR | Status: AC
  Filled 2022-01-30: qty 1

## 2022-01-30 MED ORDER — EPINEPHRINE PF 1 MG/ML IJ SOLN
INTRAMUSCULAR | Status: AC
  Filled 2022-01-30: qty 2

## 2022-01-30 MED ORDER — ORAL CARE MOUTH RINSE: 15.0000 mL | Freq: Once | OROMUCOSAL | Status: DC

## 2022-01-30 MED ORDER — FENTANYL CITRATE PF 50 MCG/ML IJ SOSY: 25.0000 ug | PREFILLED_SYRINGE | INTRAMUSCULAR | Status: DC | PRN

## 2022-01-30 MED ORDER — CHLORHEXIDINE GLUCONATE 0.12 % MT SOLN: 15.0000 mL | Freq: Once | OROMUCOSAL | Status: DC

## 2022-01-30 MED ORDER — DEXAMETHASONE SODIUM PHOSPHATE 10 MG/ML IJ SOLN
INTRAMUSCULAR | Status: DC | PRN
  Administered 2022-01-30: 10 mg via INTRAVENOUS

## 2022-01-30 MED ORDER — FENTANYL CITRATE (PF) 100 MCG/2ML IJ SOLN
INTRAMUSCULAR | Status: DC | PRN
  Administered 2022-01-30 (×2): 25 ug via INTRAVENOUS

## 2022-01-30 MED ORDER — LIDOCAINE 2% (20 MG/ML) 5 ML SYRINGE
INTRAMUSCULAR | Status: DC | PRN
  Administered 2022-01-30: 40 mg via INTRAVENOUS

## 2022-01-30 MED ORDER — ONDANSETRON HCL 4 MG/2ML IJ SOLN
INTRAMUSCULAR | Status: AC
  Filled 2022-01-30: qty 2

## 2022-01-30 MED ORDER — FENTANYL CITRATE (PF) 100 MCG/2ML IJ SOLN
INTRAMUSCULAR | Status: AC
  Filled 2022-01-30: qty 2

## 2022-01-30 MED ORDER — CEFAZOLIN SODIUM-DEXTROSE 2-4 GM/100ML-% IV SOLN
2.0000 g | INTRAVENOUS | Status: AC
  Administered 2022-01-30: 2 g via INTRAVENOUS

## 2022-01-30 MED ORDER — LACTATED RINGERS IV SOLN: INTRAVENOUS | Status: DC | PRN

## 2022-01-30 MED ORDER — OXYCODONE HCL 5 MG/5ML PO SOLN: 5.0000 mg | Freq: Once | ORAL | Status: DC | PRN

## 2022-01-30 MED ORDER — BUPIVACAINE-EPINEPHRINE (PF) 0.5% -1:200000 IJ SOLN
INTRAMUSCULAR | Status: DC | PRN
  Administered 2022-01-30: 30 mL via PERINEURAL

## 2022-01-30 MED ORDER — LACTATED RINGERS IV SOLN: INTRAVENOUS | Status: DC

## 2022-01-30 MED ORDER — ONDANSETRON HCL 4 MG/2ML IJ SOLN: 4.0000 mg | Freq: Once | INTRAMUSCULAR | Status: DC | PRN

## 2022-01-30 SURGICAL SUPPLY — 45 items
ABLATOR ASPIRATE 50D MULTI-PRT (SURGICAL WAND) IMPLANT
APL PRP STRL LF DISP 70% ISPRP (MISCELLANEOUS) ×1
BAG HAMPER (MISCELLANEOUS) ×1 IMPLANT
BLADE SURG SZ11 CARB STEEL (BLADE) ×1 IMPLANT
BNDG CMPR STD VLCR NS LF 5.8X6 (GAUZE/BANDAGES/DRESSINGS) ×1
BNDG ELASTIC 6X5.8 VLCR NS LF (GAUZE/BANDAGES/DRESSINGS) ×1 IMPLANT
CHLORAPREP W/TINT 26 (MISCELLANEOUS) ×1 IMPLANT
CLOTH BEACON ORANGE TIMEOUT ST (SAFETY) ×1 IMPLANT
COOLER ICEMAN CLASSIC (MISCELLANEOUS) ×1 IMPLANT
CUFF TOURN SGL QUICK 34 (TOURNIQUET CUFF) ×1
CUFF TRNQT CYL 34X4.125X (TOURNIQUET CUFF) IMPLANT
DECANTER SPIKE VIAL GLASS SM (MISCELLANEOUS) ×2 IMPLANT
DRAPE HALF SHEET 40X57 (DRAPES) ×1 IMPLANT
GAUZE 4X4 16PLY ~~LOC~~+RFID DBL (SPONGE) ×1 IMPLANT
GAUZE SPONGE 4X4 12PLY STRL (GAUZE/BANDAGES/DRESSINGS) ×1 IMPLANT
GAUZE XEROFORM 5X9 LF (GAUZE/BANDAGES/DRESSINGS) ×1 IMPLANT
GLOVE BIOGEL PI IND STRL 7.0 (GLOVE) ×2 IMPLANT
GLOVE SS N UNI LF 8.5 STRL (GLOVE) ×1 IMPLANT
GLOVE SURG POLYISO LF SZ8 (GLOVE) ×1 IMPLANT
GOWN STRL REUS W/TWL LRG LVL3 (GOWN DISPOSABLE) ×1 IMPLANT
GOWN STRL REUS W/TWL XL LVL3 (GOWN DISPOSABLE) ×1 IMPLANT
IV NS IRRIG 3000ML ARTHROMATIC (IV SOLUTION) ×2 IMPLANT
KIT BLADEGUARD II DBL (SET/KITS/TRAYS/PACK) ×1 IMPLANT
KIT TURNOVER CYSTO (KITS) ×1 IMPLANT
MANIFOLD NEPTUNE II (INSTRUMENTS) ×1 IMPLANT
MARKER SKIN DUAL TIP RULER LAB (MISCELLANEOUS) ×1 IMPLANT
NDL HYPO 21X1.5 SAFETY (NEEDLE) ×1 IMPLANT
NDL SPNL 18GX3.5 QUINCKE PK (NEEDLE) ×1 IMPLANT
NEEDLE HYPO 21X1.5 SAFETY (NEEDLE) ×1 IMPLANT
NEEDLE SPNL 18GX3.5 QUINCKE PK (NEEDLE) ×1 IMPLANT
NS IRRIG 1000ML POUR BTL (IV SOLUTION) ×1 IMPLANT
PACK ARTHRO LIMB DRAPE STRL (MISCELLANEOUS) ×1 IMPLANT
PACK ARTHROSCOPY WL (CUSTOM PROCEDURE TRAY) ×1 IMPLANT
PAD ABD 5X9 TENDERSORB (GAUZE/BANDAGES/DRESSINGS) ×1 IMPLANT
PAD ARMBOARD 7.5X6 YLW CONV (MISCELLANEOUS) ×1 IMPLANT
PAD COLD SHLDR SM WRAP-ON (PAD) IMPLANT
PAD FOR LEG HOLDER (MISCELLANEOUS) ×1 IMPLANT
PADDING CAST COTTON 6X4 STRL (CAST SUPPLIES) ×1 IMPLANT
RESECTOR TORPEDO 4MM 13CM CVD (MISCELLANEOUS) IMPLANT
SET ARTHROSCOPY INST (INSTRUMENTS) ×1 IMPLANT
SET BASIN LINEN APH (SET/KITS/TRAYS/PACK) ×1 IMPLANT
SUT ETHILON 3 0 FSL (SUTURE) ×1 IMPLANT
SYR 30ML LL (SYRINGE) ×2 IMPLANT
TUBE CONNECTING 12X1/4 (SUCTIONS) ×2 IMPLANT
TUBING IN/OUT FLOW W/MAIN PUMP (TUBING) ×1 IMPLANT

## 2022-01-30 NOTE — Anesthesia Preprocedure Evaluation (Signed)
Anesthesia Evaluation  Patient identified by MRN, date of birth, ID band Patient awake    Reviewed: Allergy & Precautions, H&P , NPO status , Patient's Chart, lab work & pertinent test results, reviewed documented beta blocker date and time   Airway Mallampati: II  TM Distance: >3 FB Neck ROM: full    Dental no notable dental hx.    Pulmonary neg pulmonary ROS, former smoker,    Pulmonary exam normal breath sounds clear to auscultation       Cardiovascular Exercise Tolerance: Good hypertension, + CAD and + Cardiac Stents   Rhythm:regular Rate:Normal     Neuro/Psych negative neurological ROS  negative psych ROS   GI/Hepatic negative GI ROS, Neg liver ROS,   Endo/Other  negative endocrine ROS  Renal/GU negative Renal ROS  negative genitourinary   Musculoskeletal   Abdominal   Peds  Hematology negative hematology ROS (+)   Anesthesia Other Findings   Reproductive/Obstetrics negative OB ROS                             Anesthesia Physical Anesthesia Plan  ASA: 3  Anesthesia Plan: General and General LMA   Post-op Pain Management:    Induction:   PONV Risk Score and Plan: Ondansetron  Airway Management Planned:   Additional Equipment:   Intra-op Plan:   Post-operative Plan:   Informed Consent: I have reviewed the patients History and Physical, chart, labs and discussed the procedure including the risks, benefits and alternatives for the proposed anesthesia with the patient or authorized representative who has indicated his/her understanding and acceptance.     Dental Advisory Given  Plan Discussed with: CRNA  Anesthesia Plan Comments:         Anesthesia Quick Evaluation

## 2022-01-30 NOTE — Op Note (Signed)
01/30/2022  10:40 AM  Knee arthroscopy dictation  Preop diagnosis medial meniscus tear right knee   Postop diagnosis medial meniscus tear right knee with Grade 3 degeneration medial femoral condyle   Procedure arthroscopy right knee with partial medial menisectomy   Surgeon Aline Brochure  Operative findings  MEDIAL - meniscus tera posterior horn and root  -articular surface Grade 3   LATERAL - meniscus normal  -articular surface, normal   PTF   -articular surface mild grade 2 focal degeneration medial facet and the trochlea was normal   NOTCH  -acl normal  -pcl normal      The patient was identified in the preoperative holding area using 2 approved identification mechanisms. The chart was reviewed and updated. The surgical site was confirmed as right knee and marked with an indelible marker.  The patient was taken to the operating room for anesthesia. After successful general anesthesia, Ancef 2 g was used as IV antibiotics.  The patient was placed in the supine position with the (right) the operative extremity in an arthroscopic leg holder and the opposite extremity in a padded leg holder.  The timeout was executed.  A lateral portal was established with an 11 blade and the scope was introduced into the joint. A diagnostic arthroscopy was performed in circumferential manner examining the entire knee joint. A medial portal was established and the diagnostic arthroscopy was repeated using a probe to palpate intra-articular structures as they were encountered.    The medial meniscus was resected using a duckbill forceps. The meniscal fragments were removed with a motorized shaver. The meniscus was balanced with a combination of a motorized shaver and a 50 ArthroCare wand until a stable rim was obtained.   The arthroscopic pump was placed on the wash mode and any excess debris was removed from the joint using suction.  60 cc of Marcaine with epinephrine was injected through  the arthroscope.  The portals were closed with 3-0 nylon suture.  A sterile bandage, Ace wrap and Cryo/Cuff was placed and the Cryo/Cuff was activated. The patient was taken to the recovery room in stable condition.  PHYSICIAN ASSISTANT: no  ASSISTANTS: none   ANESTHESIA:   General  EBL:  none   BLOOD ADMINISTERED:none  DRAINS: none   LOCAL MEDICATIONS USED:  MARCAINE     SPECIMEN:  No Specimen  DISPOSITION OF SPECIMEN:  N/A  COUNTS:  YES   DICTATION: .Dragon Dictation  PLAN OF CARE: Discharge to home after PACU  PATIENT DISPOSITION:  PACU - hemodynamically stable.   Delay start of Pharmacological VTE agent (>24hrs) due to surgical blood loss or risk of bleeding: not applicable  POST OP PLAN  WB as tolerated SUTURES OUT IN A WEEK  AROM  BRACE none needed

## 2022-01-30 NOTE — Interval H&P Note (Signed)
History and Physical Interval Note:  01/30/2022 9:42 AM  Angel Rivera  has presented today for surgery, with the diagnosis of right knee medial meniscus tear.  The various methods of treatment have been discussed with the patient and family. After consideration of risks, benefits and other options for treatment, the patient has consented to  Procedure(s): KNEE ARTHROSCOPY WITH MEDIAL MENISECTOMY (Right) as a surgical intervention.  The patient's history has been reviewed, patient examined, no change in status, stable for surgery.  I have reviewed the patient's chart and labs.  Questions were answered to the patient's satisfaction.     Arther Abbott

## 2022-01-30 NOTE — Interval H&P Note (Signed)
History and Physical Interval Note:  01/30/2022 9:39 AM  Angel Rivera  has presented today for surgery, with the diagnosis of right knee medial meniscus tear.  The various methods of treatment have been discussed with the patient and family. After consideration of risks, benefits and other options for treatment, the patient has consented to  Procedure(s): KNEE ARTHROSCOPY WITH MEDIAL MENISECTOMY (Right) as a surgical intervention.  The patient's history has been reviewed, patient examined, no change in status, stable for surgery.  I have reviewed the patient's chart and labs.  Questions were answered to the patient's satisfaction.     Arther Abbott

## 2022-01-30 NOTE — Transfer of Care (Signed)
Immediate Anesthesia Transfer of Care Note  Patient: Angel Rivera  Procedure(s) Performed: KNEE ARTHROSCOPY WITH MEDIAL MENISECTOMY (Right: Knee)  Patient Location: PACU  Anesthesia Type:General  Level of Consciousness: awake, alert , oriented and patient cooperative  Airway & Oxygen Therapy: Patient Spontanous Breathing and Patient connected to nasal cannula oxygen  Post-op Assessment: Report given to RN, Post -op Vital signs reviewed and stable and Patient moving all extremities  Post vital signs: Reviewed and stable  Last Vitals:  Vitals Value Taken Time  BP 145/75 01/30/22 1045  Temp    Pulse 73 01/30/22 1051  Resp 12 01/30/22 1051  SpO2 100 % 01/30/22 1051  Vitals shown include unvalidated device data.  Last Pain: There were no vitals filed for this visit.       Complications: No notable events documented.

## 2022-01-30 NOTE — Anesthesia Procedure Notes (Signed)
Procedure Name: LMA Insertion Date/Time: 01/30/2022 10:00 AM  Performed by: Myna Bright, CRNAPre-anesthesia Checklist: Patient identified, Emergency Drugs available, Suction available and Patient being monitored Patient Re-evaluated:Patient Re-evaluated prior to induction Oxygen Delivery Method: Circle system utilized Preoxygenation: Pre-oxygenation with 100% oxygen Induction Type: IV induction Ventilation: Mask ventilation without difficulty LMA: LMA inserted LMA Size: 4.0 Number of attempts: 1 Placement Confirmation: positive ETCO2 and breath sounds checked- equal and bilateral Tube secured with: Tape Dental Injury: Teeth and Oropharynx as per pre-operative assessment

## 2022-01-30 NOTE — Brief Op Note (Signed)
01/30/2022  10:39 AM  PATIENT:  Denzil Hughes  71 y.o. female  PRE-OPERATIVE DIAGNOSIS:  right knee medial meniscus tear  POST-OPERATIVE DIAGNOSIS:  right knee medial meniscus tear  PROCEDURE:  Procedure(s): KNEE ARTHROSCOPY WITH MEDIAL MENISECTOMY (Right)  SURGEON:  Surgeon(s) and Role:    Carole Civil, MD - Primary  PHYSICIAN ASSISTANT:   ASSISTANTS: none   ANESTHESIA:   general  EBL:  none   BLOOD ADMINISTERED:none  DRAINS: none   LOCAL MEDICATIONS USED:  MARCAINE     SPECIMEN:  No Specimen  DISPOSITION OF SPECIMEN:  N/A  COUNTS:  YES  TOURNIQUET:  * Missing tourniquet times found for documented tourniquets in log: [id number] *  DICTATION: .Dragon Dictation  PLAN OF CARE: Discharge to home after PACU  PATIENT DISPOSITION:  PACU - hemodynamically stable.   Delay start of Pharmacological VTE agent (>24hrs) due to surgical blood loss or risk of bleeding: not applicable

## 2022-01-31 ENCOUNTER — Telehealth: Payer: Self-pay | Admitting: Orthopedic Surgery

## 2022-01-31 NOTE — Telephone Encounter (Signed)
Patient called with question about "ice machine" received post knee surgery done yesterday, 01/29/22. Please advise.

## 2022-01-31 NOTE — Telephone Encounter (Signed)
I discussed with her she asked about frequency of ice machine.

## 2022-02-01 NOTE — Anesthesia Postprocedure Evaluation (Signed)
Anesthesia Post Note  Patient: Angel Rivera  Procedure(s) Performed: KNEE ARTHROSCOPY WITH MEDIAL MENISECTOMY (Right: Knee)  Patient location during evaluation: Phase II Anesthesia Type: General Level of consciousness: awake Pain management: pain level controlled Vital Signs Assessment: post-procedure vital signs reviewed and stable Respiratory status: spontaneous breathing and respiratory function stable Cardiovascular status: blood pressure returned to baseline and stable Postop Assessment: no headache and no apparent nausea or vomiting Anesthetic complications: no Comments: Late entry   No notable events documented.   Last Vitals:  Vitals:   01/30/22 1115 01/30/22 1133  BP: 136/75 (!) 149/69  Pulse: 64 (!) 59  Resp: 12 16  Temp:  36.4 C  SpO2: 100% 100%    Last Pain:  Vitals:   01/31/22 1522  TempSrc:   PainSc: 0-No pain                 Louann Sjogren

## 2022-02-06 ENCOUNTER — Encounter (HOSPITAL_COMMUNITY): Payer: Self-pay | Admitting: Orthopedic Surgery

## 2022-02-06 NOTE — Addendum Note (Signed)
Addendum  created 02/06/22 1424 by Myna Bright, CRNA   Intraprocedure Staff edited

## 2022-02-07 ENCOUNTER — Ambulatory Visit (INDEPENDENT_AMBULATORY_CARE_PROVIDER_SITE_OTHER): Payer: PPO | Admitting: Orthopedic Surgery

## 2022-02-07 DIAGNOSIS — Z9889 Other specified postprocedural states: Secondary | ICD-10-CM | POA: Insufficient documentation

## 2022-02-07 NOTE — Progress Notes (Signed)
Chief Complaint  Patient presents with   Post-op Follow-up    SARK 01/30/22 improving    Procedure arthroscopy right knee with partial medial menisectomy    Surgeon Aline Brochure   Operative findings   MEDIAL - meniscus tera posterior horn and root  -articular surface Grade 3    LATERAL - meniscus normal  -articular surface, normal    PTF   -articular surface mild grade 2 focal degeneration medial facet and the trochlea was normal    NOTCH  -acl normal  -pcl normal     Angel Rivera is doing well  She s on ibuprofen   She is walking independently  She has clean portals and sutures are removed   Her rom is 90 +  Rec HEP  Return 3 weeks   Dont over do it

## 2022-02-12 ENCOUNTER — Other Ambulatory Visit (HOSPITAL_COMMUNITY): Payer: Self-pay | Admitting: Family Medicine

## 2022-02-12 DIAGNOSIS — Z1231 Encounter for screening mammogram for malignant neoplasm of breast: Secondary | ICD-10-CM

## 2022-03-05 ENCOUNTER — Ambulatory Visit (INDEPENDENT_AMBULATORY_CARE_PROVIDER_SITE_OTHER): Payer: PPO | Admitting: Orthopedic Surgery

## 2022-03-05 ENCOUNTER — Encounter: Payer: Self-pay | Admitting: Orthopedic Surgery

## 2022-03-05 DIAGNOSIS — Z9889 Other specified postprocedural states: Secondary | ICD-10-CM

## 2022-03-05 DIAGNOSIS — M1711 Unilateral primary osteoarthritis, right knee: Secondary | ICD-10-CM

## 2022-03-05 NOTE — Progress Notes (Signed)
FOLLOW UP   Encounter Diagnoses  Name Primary?   S/P right knee arthroscopy 01/30/22  Yes   Primary osteoarthritis of right knee      Chief Complaint  Patient presents with   Routine Post Op    S/p right knee arthroscopy DOS 01/30/22 Medial pain and swelling off and on    34 days post op //currently has some intermittent swelling some pain over the medial joint.  Reviewing her MRI and knee scope pictures she has some chondral thinning medially  Swelling usually responds to ice and the pain is controlled well with ibuprofen  Resume gradual return to normal activities  F/U in 6 weeks

## 2022-03-09 IMAGING — DX DG PORTABLE PELVIS
1 series · 1 of 1 positions shown · non-contrast
Comparison: Intraoperative right hip images June 17, 2020

CLINICAL DATA: Status post total hip replacement

EXAM:
PORTABLE PELVIS 1-2 VIEWS

[pelvis ap]
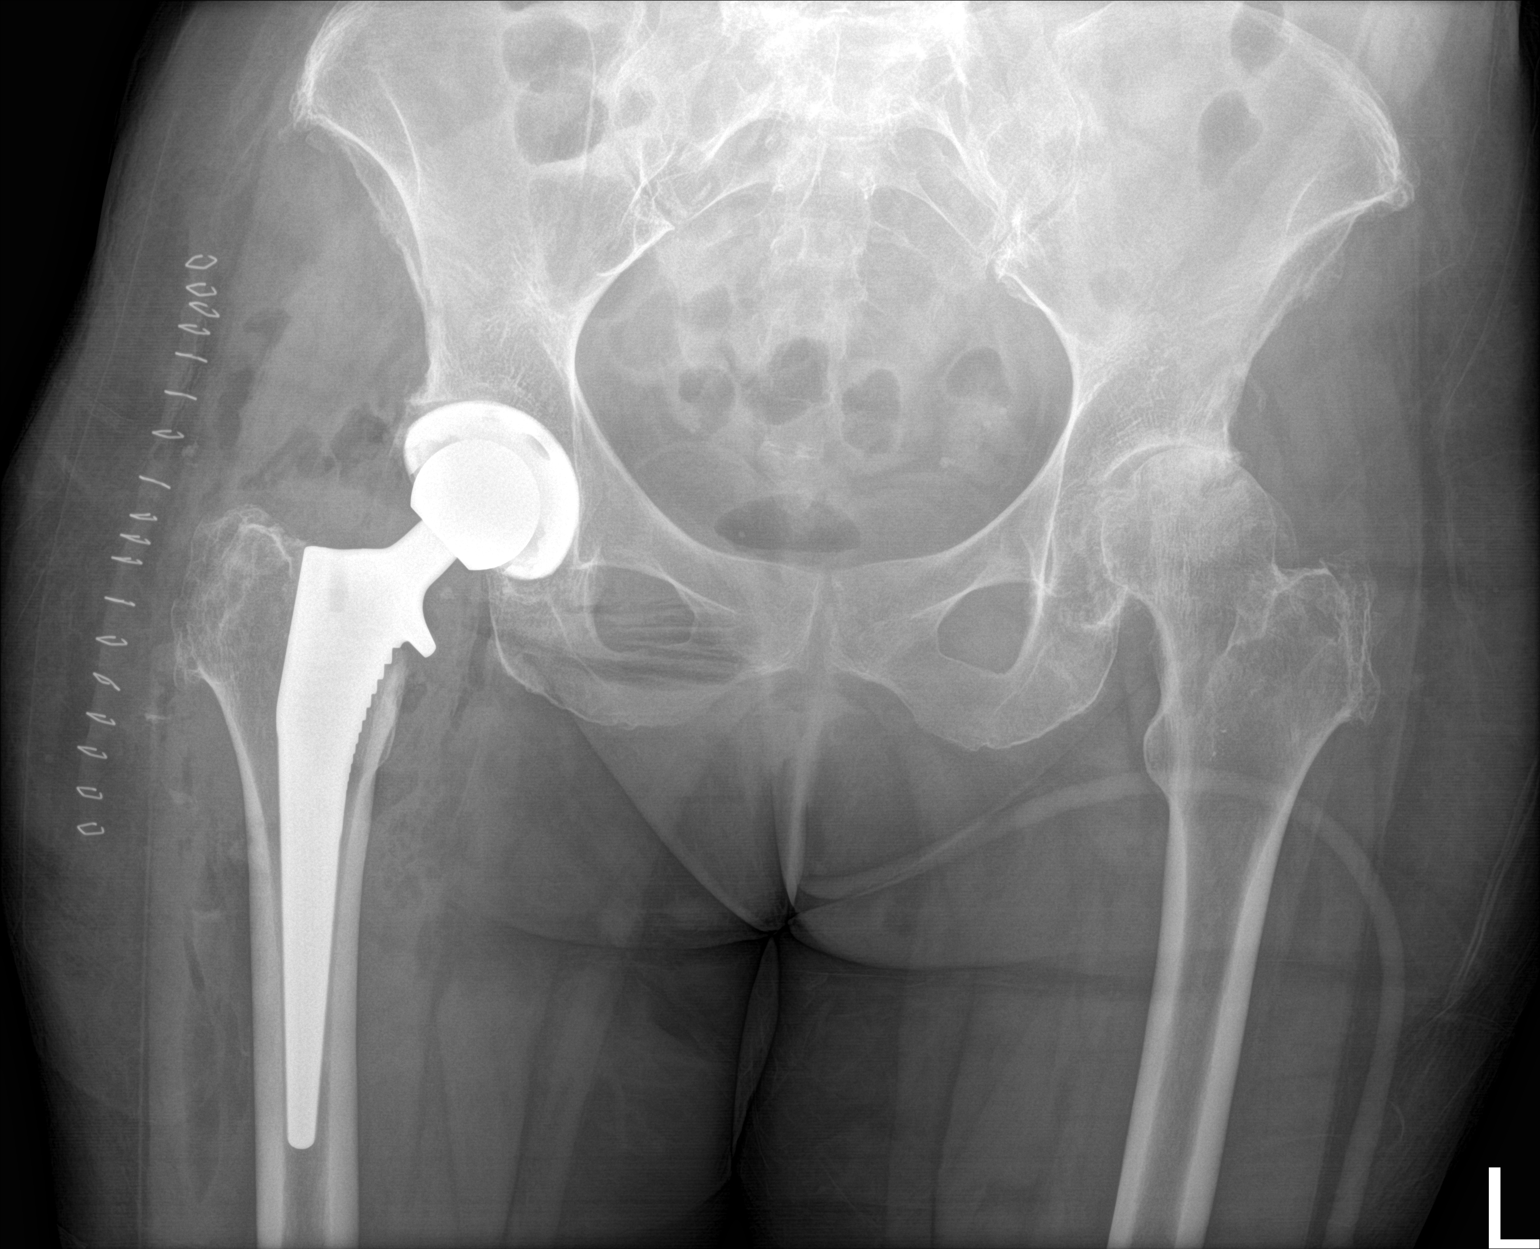

[1 of 1 positions shown; findings below may reference images not displayed]

FINDINGS: Frontal view of pelvis and hips submitted. There is a total hip
replacement right with prosthetic components well-seated on frontal
view. No fracture or dislocation. There is advanced osteoarthritic
change in the left hip joint region with suspected focus of
avascular necrosis along the lateral left femoral head. Acute
postoperative changes noted on the right.
IMPRESSION: 1. Total hip replacement on the right with prosthetic components
well-seated on frontal view. No fracture or dislocation.

2. Severe osteoarthritic change left hip joint with suspected
avascular necrosis along the lateral aspect the left femoral head.

## 2022-03-26 ENCOUNTER — Ambulatory Visit (HOSPITAL_COMMUNITY)
Admission: RE | Admit: 2022-03-26 | Discharge: 2022-03-26 | Disposition: A | Discharge status: Home / Self Care | Payer: PPO | Source: Ambulatory Visit | Attending: Family Medicine | Admitting: Family Medicine

## 2022-03-26 DIAGNOSIS — Z1231 Encounter for screening mammogram for malignant neoplasm of breast: Secondary | ICD-10-CM | POA: Diagnosis present

## 2022-04-09 ENCOUNTER — Ambulatory Visit (INDEPENDENT_AMBULATORY_CARE_PROVIDER_SITE_OTHER): Payer: PPO | Admitting: Orthopedic Surgery

## 2022-04-09 ENCOUNTER — Encounter: Payer: Self-pay | Admitting: Orthopedic Surgery

## 2022-04-09 DIAGNOSIS — Z9889 Other specified postprocedural states: Secondary | ICD-10-CM

## 2022-04-09 DIAGNOSIS — M1711 Unilateral primary osteoarthritis, right knee: Secondary | ICD-10-CM

## 2022-04-09 NOTE — Progress Notes (Signed)
Chief Complaint  Patient presents with   Routine Post Op    S/p RT knee scope DOS 01/30/22   71 year old female status post arthroscopy of the knee back in September still having some medial knee pain from the arthritis.  The meniscal symptoms have resolved  Patient should come in in a year to have the knee x-ray to check on the arthritis  She not having any swelling she regained her range of motion she has occasional pain on the medial side of the joint  Encounter Diagnoses  Name Primary?   S/P right knee arthroscopy 01/30/22  Yes   Primary osteoarthritis of right knee

## 2022-06-08 ENCOUNTER — Telehealth: Payer: Self-pay

## 2022-06-08 NOTE — Telephone Encounter (Signed)
Patient is scheduled to see Dr. Aline Brochure

## 2022-06-14 ENCOUNTER — Ambulatory Visit: Payer: PPO | Admitting: Orthopedic Surgery

## 2022-06-14 DIAGNOSIS — Z9889 Other specified postprocedural states: Secondary | ICD-10-CM | POA: Diagnosis not present

## 2022-06-14 DIAGNOSIS — M65332 Trigger finger, left middle finger: Secondary | ICD-10-CM

## 2022-06-14 DIAGNOSIS — M1711 Unilateral primary osteoarthritis, right knee: Secondary | ICD-10-CM | POA: Diagnosis not present

## 2022-06-14 NOTE — Progress Notes (Signed)
Chief Complaint  Patient presents with   Follow-up    Recheck on right knee, DOS 01-30-22.    Angel Rivera had a medial meniscectomy right knee in September.  She complains of medial knee pain and some popping.  This does increase with weightbearing.  Examination of the knee shows she is tender directly over the femoral condyle and this is consistent with her arthroscopy which showed grade 3 chondral disease on the medial femoral condyle  Her range of motion is good her strength and stability remain normal there is no effusion  Encounter Diagnoses  Name Primary?   S/P right knee arthroscopy 01/30/22     Primary osteoarthritis of right knee    Trigger finger, left middle finger Yes    Assessment and plan 72 year old female 4 to 5 months after right knee arthroscopy partial medial meniscectomy with continued knee pain presumably from arthritis  Recommend hyaluronic acid injections as she has already had a trial of Tylenol and anti-inflammatories  Addendum  Patient complained of pain over the volar aspect of the left long finger and catching  Exam showed tenderness over the A1 pulley catching and locking requiring manual manipulation consistent with trigger finger  Patient was okay to get an injection  Trigger finger injection  Diagnosis synovitis left long finger Procedure injection A1 pulley Medications lidocaine 1% 1 mL and Depo-Medrol 40 mg 1 mL Skin prep alcohol and ethyl chloride Verbal consent was obtained Timeout confirmed the injection site  After cleaning the skin with alcohol and anesthetizing the skin with ethyl chloride the A1 pulley was palpated and the injection was performed without complication

## 2022-06-14 NOTE — Patient Instructions (Addendum)
You have received an injection of steroids into the joint. 15% of patients will have increased pain within the 24 hours postinjection.   This is transient and will go away.   We recommend that you use ice packs on the injection site for 20 minutes every 2 hours and extra strength Tylenol 2 tablets every 8 as needed until the pain resolves.  If you continue to have pain after taking the Tylenol and using the ice please call the office for further instructions.    We will check which brand of Hyaluronic acid injections (there are several) your insurance covers. We will call you with price and schedule with you if insurance approves and you are okay with the out of pocket costs. If for any reason they will not cover or the out of pocket costs are high, we will discuss with you and let you know other options available.

## 2022-06-20 ENCOUNTER — Telehealth: Payer: Self-pay | Admitting: Orthopedic Surgery

## 2022-06-20 ENCOUNTER — Ambulatory Visit: Payer: PPO | Attending: Cardiovascular Disease | Admitting: Cardiovascular Disease

## 2022-06-20 ENCOUNTER — Encounter: Payer: Self-pay | Admitting: Cardiovascular Disease

## 2022-06-20 VITALS — BP 138/80 | HR 68 | Ht 64.0 in | Wt 141.0 lb

## 2022-06-20 DIAGNOSIS — I2510 Atherosclerotic heart disease of native coronary artery without angina pectoris: Secondary | ICD-10-CM

## 2022-06-20 DIAGNOSIS — E7800 Pure hypercholesterolemia, unspecified: Secondary | ICD-10-CM

## 2022-06-20 LAB — HEPATIC FUNCTION PANEL
ALT: 21 IU/L (ref 0–32)
AST: 23 IU/L (ref 0–40)
Albumin: 5 g/dL — ABNORMAL HIGH (ref 3.8–4.8)
Alkaline Phosphatase: 102 IU/L (ref 44–121)
Bilirubin Total: 0.8 mg/dL (ref 0.0–1.2)
Bilirubin, Direct: 0.18 mg/dL (ref 0.00–0.40)
Total Protein: 7.7 g/dL (ref 6.0–8.5)

## 2022-06-20 LAB — LIPID PANEL
Chol/HDL Ratio: 2.3 ratio (ref 0.0–4.4)
Cholesterol, Total: 178 mg/dL (ref 100–199)
HDL: 76 mg/dL (ref >39)
LDL Chol Calc (NIH): 80 mg/dL (ref 0–99)
Triglycerides: 127 mg/dL (ref 0–149)
VLDL Cholesterol Cal: 22 mg/dL (ref 5–40)

## 2022-06-20 MED ORDER — LOVASTATIN 40 MG PO TABS: ORAL_TABLET | ORAL | 3 refills | Status: DC

## 2022-06-20 MED ORDER — METOPROLOL SUCCINATE ER 25 MG PO TB24: ORAL_TABLET | ORAL | 3 refills | Status: DC

## 2022-06-20 NOTE — Patient Instructions (Signed)
Medication Instructions:  Your physician recommends that you continue on your current medications as directed. Please refer to the Current Medication list given to you today.  *If you need a refill on your cardiac medications before your next appointment, please call your pharmacy*   Lab Work: Lipids, Lft's - today   If you have labs (blood work) drawn today and your tests are completely normal, you will receive your results only by: [place] (if you have MyChart) OR A paper copy in the mail If you have any lab test that is abnormal or we need to change your treatment, we will call you to review the results.   Testing/Procedures: None ordered    Follow-Up: At [facility], you and your health needs are our priority.  As part of our continuing mission to provide you with exceptional heart care, we have created designated Provider Care Teams.  These Care Teams include your primary Cardiologist (physician) and Advanced Practice Providers (APPs -  Physician Assistants and Nurse Practitioners) who all work together to provide you with the care you need, when you need it.  We recommend signing up for the patient portal called "MyChart".  Sign up information is provided on this After Visit Summary.  MyChart is used to connect with patients for Virtual Visits (Telemedicine).  Patients are able to view lab/test results, encounter notes, upcoming appointments, etc.  Non-urgent messages can be sent to your provider as well.   To learn more about what you can do with MyChart, go to [email or web address].    Your next appointment:   12 month(s)  Provider:   Lauree Chandler, MD     Other Instructions

## 2022-06-20 NOTE — Telephone Encounter (Signed)
Called the patient and lvm to call us back to schedule her Orthovisc injections x 3.

## 2022-06-20 NOTE — Progress Notes (Signed)
Chief Complaint  Patient presents with   Follow-up    CAD   History of Present Illness: 73 yo female with history of CAD, hyperlipidemia and tobacco abuse here today for cardiac follow up. She was admitted to [facility] 2011 with a NSTEMI secondary to subtotally occluded distal RCA which was treated with a drug eluting stent. I saw her in August 2017 and she c/o pain in her upper back which was her prior anginal equivalent. Cardiac cath 12/30/15 with severe stenosis mid RCA treated with a drug eluting stent. There was stable moderate disease in the mid LAD and mid Circumflex. She has not tolerated Lipitor but has tolerated Mevacor. She also did not tolerate Coreg but has tolerated low dose Toprol.   She is here today for follow up. The patient denies any chest pain, dyspnea, palpitations, lower extremity edema, orthopnea, PND, dizziness, near syncope or syncope.   Primary Care Physician: Lemmie Evens, MD   Past Medical History:  Diagnosis Date   Arthritis    Coronary artery disease    NATIVE VESSEL (ICD-414.01)-cath 08/17/09 with subtotally occluded RCA, moderate disease LAD.   Hyperlipidemia    Hypertension    Hypotension    Myocardial infarction ([place]) 08/17/2009   Tobacco user     Past Surgical History:  Procedure Laterality Date   ABDOMINAL HYSTERECTOMY  1980s   laparoscopic   CARDIAC CATHETERIZATION N/A 12/30/2015   Procedure: Left Heart Cath and Coronary Angiography;  Surgeon: Burnell Blanks, MD;  Location: [place] [facility];  Service: Cardiovascular;  Laterality: N/A;   CARDIAC CATHETERIZATION N/A 12/30/2015   Procedure: Coronary Stent Intervention;  Surgeon: Burnell Blanks, MD;  Location: [place] [facility];  Service: Cardiovascular;  Laterality: N/A;   CORONARY ANGIOPLASTY WITH STENT PLACEMENT  08/17/2009   "1 stent"   KNEE ARTHROSCOPY WITH MEDIAL MENISECTOMY Right 01/30/2022   Procedure: KNEE ARTHROSCOPY WITH MEDIAL MENISECTOMY;  Surgeon: Carole Civil, MD;  Location: [facility];  Service: Orthopedics;  Laterality: Right;   TOTAL HIP ARTHROPLASTY Right 06/17/2020   Procedure: RIGHT TOTAL HIP ARTHROPLASTY ANTERIOR APPROACH;  Surgeon: Mcarthur Rossetti, MD;  Location: [facility];  Service: Orthopedics;  Laterality: Right;   TOTAL HIP ARTHROPLASTY Left 10/21/2020   Procedure: LEFT TOTAL HIP ARTHROPLASTY ANTERIOR APPROACH;  Surgeon: Mcarthur Rossetti, MD;  Location: [facility];  Service: Orthopedics;  Laterality: Left;   TUBAL LIGATION  ~ 1980    Current Outpatient Medications  Medication Sig Dispense Refill   ascorbic acid (VITAMIN C) 500 MG tablet Take 500 mg by mouth in the morning.     aspirin EC 81 MG tablet Take 81 mg by mouth in the morning. Swallow whole.     B Complex-C (B-COMPLEX WITH VITAMIN C) tablet Take 1 tablet by mouth in the morning.     Biotin 5000 MCG TABS Take 5,000 mcg by mouth in the morning.     cholecalciferol (VITAMIN D) 25 MCG (1000 UNIT) tablet Take 1,000 Units by mouth in the morning.     LORazepam (ATIVAN) 0.5 MG tablet Take 0.5 mg by mouth at bedtime.     milk thistle 175 MG tablet Take 175 mg by mouth in the morning.     lovastatin (MEVACOR) 40 MG tablet TAKE 1 TABLET(40 MG) BY MOUTH AT BEDTIME 90 tablet 3   metoprolol succinate (TOPROL-XL) 25 MG 24 hr tablet TAKE 1/2 TABLET(12.5 MG) BY MOUTH DAILY 45 tablet 3   No current facility-administered medications for this  visit.    Allergies  Allergen Reactions   Lipitor [Atorvastatin] Other (See Comments)    Leg pain    Social History   Socioeconomic History   Marital status: Divorced    Spouse name: Not on file   Number of children: Not on file   Years of education: Not on file   Highest education level: Not on file  Occupational History   Not on file  Tobacco Use   Smoking status: Former    Packs/day: 0.50    Years: 33.00    Total pack years: 16.50    Types: Cigarettes    Quit date: 05/07/2013    Years since quitting: 9.1   Smokeless  tobacco: Never  Vaping Use   Vaping Use: Never used  Substance and Sexual Activity   Alcohol use: Not Currently    Comment: 12/30/2015 "might have a drink once/year"   Drug use: No   Sexual activity: Not on file  Other Topics Concern   Not on file  Social History Narrative   The patient has a almost 1-pack per day tobacco use     history.     She occasionally has alcohol-no abuse.     No illicit drug use   Divorced, 2 children   Social Determinants of Radio broadcast assistant Strain: Not on file  Food Insecurity: Not on file  Transportation Needs: Not on file  Physical Activity: Not on file  Stress: Not on file  Social Connections: Not on file  Intimate Partner Violence: Not on file    Family History  Problem Relation Age of Onset   Heart attack Mother    Coronary artery disease Other    Heart disease Father        bypass   Heart disease Brother        bypass    Review of Systems:  As stated in the HPI and otherwise negative.   BP 138/80   Pulse 68   Ht 5' 4"$  (1.626 m)   Wt 64 kg   SpO2 98%   BMI 24.20 kg/m   Physical Examination:  General: Well developed, well nourished, NAD  HEENT: OP clear, mucus membranes moist  SKIN: warm, dry. No rashes. Neuro: No focal deficits  Musculoskeletal: Muscle strength 5/5 all ext  Psychiatric: Mood and affect normal  Neck: No JVD, no carotid bruits, no thyromegaly, no lymphadenopathy.  Lungs:Clear bilaterally, no wheezes, rhonci, crackles Cardiovascular: Regular rate and rhythm. No murmurs, gallops or rubs. Abdomen:Soft. Bowel sounds present. Non-tender.  Extremities: No lower extremity edema. Pulses are 2 + in the bilateral DP/PT.  EKG:  EKG is ordered today. The ekg ordered today demonstrates Sinus with non-specific ST and T wave abnormality.   Recent Labs: 01/26/2022: BUN 27; Creatinine, Ser 1.34; Hemoglobin 12.7; Platelets 269; Potassium 4.0; Sodium 140   Lipid Panel    Component Value Date/Time   CHOL 154  06/19/2021 1022   TRIG 151 (H) 06/19/2021 1022   HDL 61 06/19/2021 1022   CHOLHDL 2.5 06/19/2021 1022   CHOLHDL 2.3 04/03/2016 0919   VLDL 24 04/03/2016 0919   LDLCALC 67 06/19/2021 1022   LDLDIRECT 113.7 10/15/2011 1105     Wt Readings from Last 3 Encounters:  06/20/22 64 kg  01/26/22 63 kg  01/15/22 63 kg    Assessment and Plan:   1. CAD without angina: No chest pain. Continue ASA, beta blocker and statin.      2. Tobacco abuse, in remission:  She stopped smoking in February 2015.  3. Hyperlipidemia: LDL 67 in February 2023. Continue statin. Check lipids and LFTs now  Labs/ tests ordered today include:   Orders Placed This Encounter  Procedures   Lipid panel   Hepatic function panel   EKG 12-Lead   Disposition:   F/U with me 12 months.   Signed, Lauree Chandler, MD 06/20/2022 10:21 AM    [facility] [place], [place],   [address] Phone: [phone]; Fax: [phone]

## 2022-06-21 ENCOUNTER — Telehealth: Payer: Self-pay | Admitting: Orthopedic Surgery

## 2022-06-21 NOTE — Telephone Encounter (Signed)
Spoke w/the patient, she does not want to schedule her injections yet, she wants to call her insurance first.  She will call if/when she's ready to schedule.

## 2022-06-27 ENCOUNTER — Telehealth: Payer: Self-pay | Admitting: Orthopedic Surgery

## 2022-06-27 NOTE — Telephone Encounter (Signed)
She spoke to insurance company she wants to know how much the injection charge is? She said she has to pay copay for the meds, but wants to know about charges  Do you know what the injection charge is?

## 2022-06-27 NOTE — Telephone Encounter (Signed)
Patient lvm stating she has some questions regarding the Orthovisc injections.  Pt's # [phone]

## 2022-06-28 ENCOUNTER — Telehealth: Payer: Self-pay | Admitting: Orthopedic Surgery

## 2022-06-28 NOTE — Telephone Encounter (Signed)
Thanks I called her and we discussed she said the insurance told her $20 copay and the rest depends on what we charge, and they told her we need to call the insurance company

## 2022-06-28 NOTE — Telephone Encounter (Signed)
Called the patient, lvm asking if she would like to go ahead and schedule her Orthovisc injections, if so to call us back to schedule.

## 2022-06-29 ENCOUNTER — Telehealth: Payer: Self-pay

## 2022-06-29 NOTE — Telephone Encounter (Signed)
Patient left voicemail message wanting to check about the injections, I called her back and had to leave her a message to call the office.

## 2022-07-05 ENCOUNTER — Telehealth: Payer: Self-pay | Admitting: Orthopedic Surgery

## 2022-07-05 ENCOUNTER — Encounter: Payer: Self-pay | Admitting: Radiology

## 2022-07-05 NOTE — Telephone Encounter (Signed)
Error

## 2022-07-05 NOTE — Telephone Encounter (Signed)
Patient lvm stating that she would like someone to call her and tell her how much the injections will be each time she comes in.  She stated that no one has called her to tell her that.  Pt's  # [phone]

## 2022-07-06 ENCOUNTER — Encounter: Payer: Self-pay | Admitting: Radiology

## 2022-07-06 NOTE — Telephone Encounter (Signed)
I called discussed, updated referral.

## 2022-07-13 IMAGING — RF DG HIP (WITH PELVIS) OPERATIVE*L*
1 series · 3 of 3 positions shown · non-contrast
Comparison: September 06, 2020

CLINICAL DATA: Status post total hip replacement

EXAM:
OPERATIVE LEFT HIP  1 VIEW
TECHNIQUE: Fluoroscopic spot image(s) were submitted for interpretation
post-operatively.
FLUOROSCOPY TIME:  0 minutes 11 seconds; 3 acquired images

[Series 1: unknown protocol · 0.20mm/px · 3 of 3 slices shown]
[im 1/3]
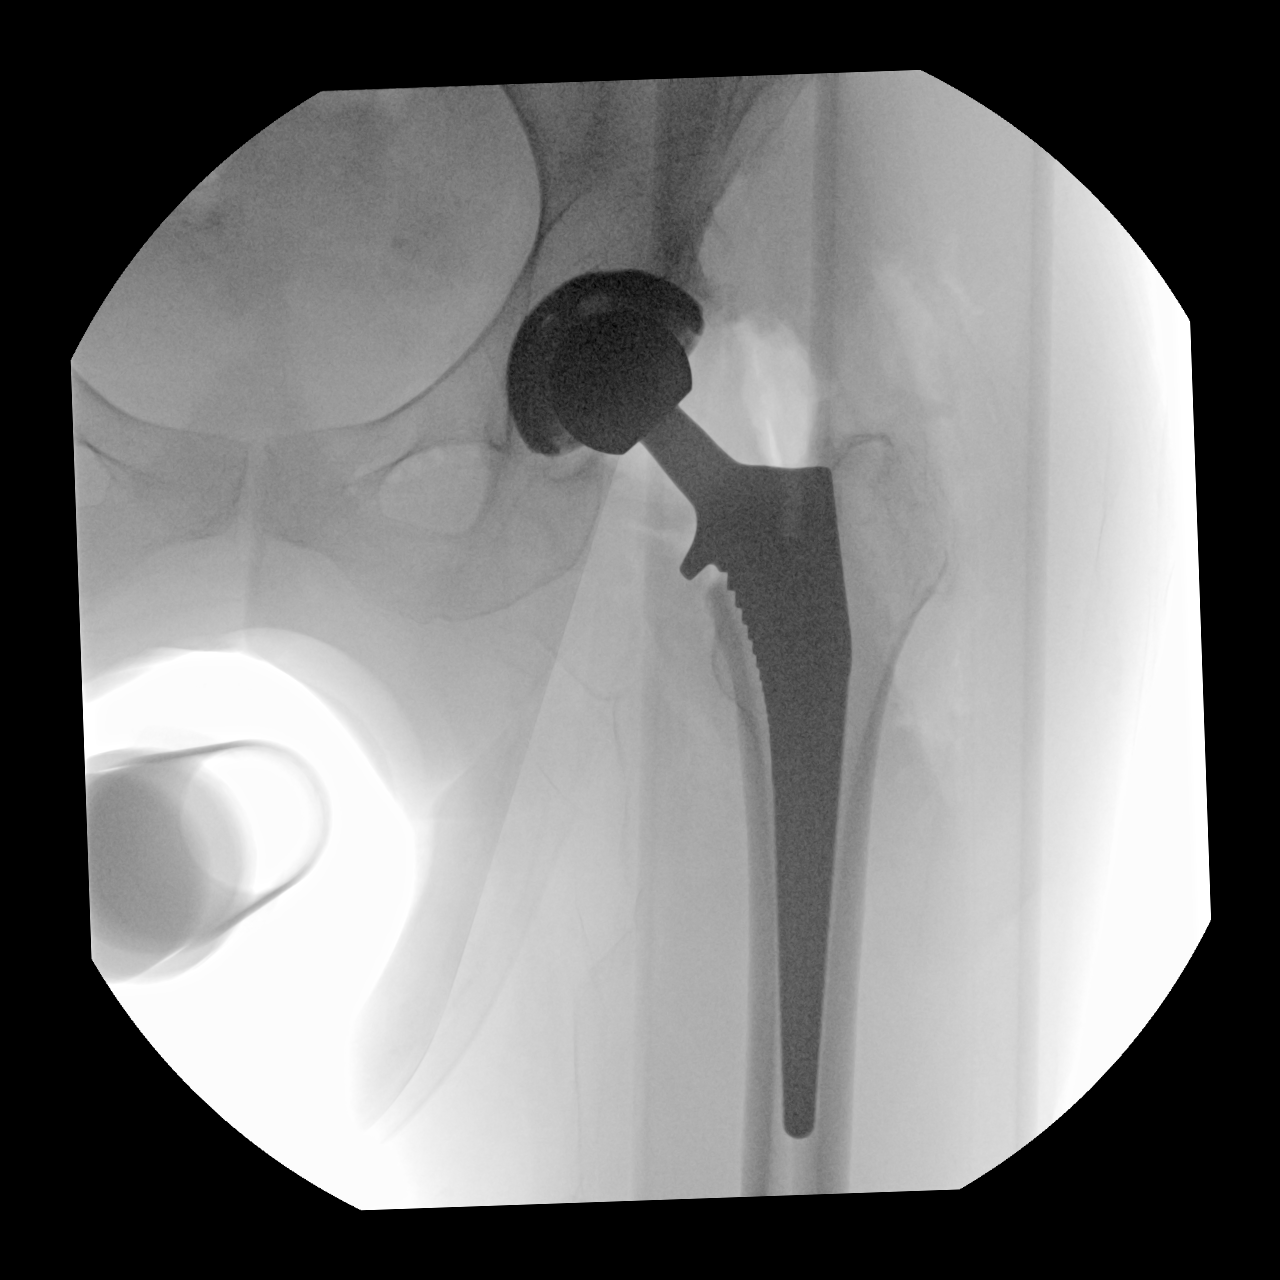
[im 2/3]
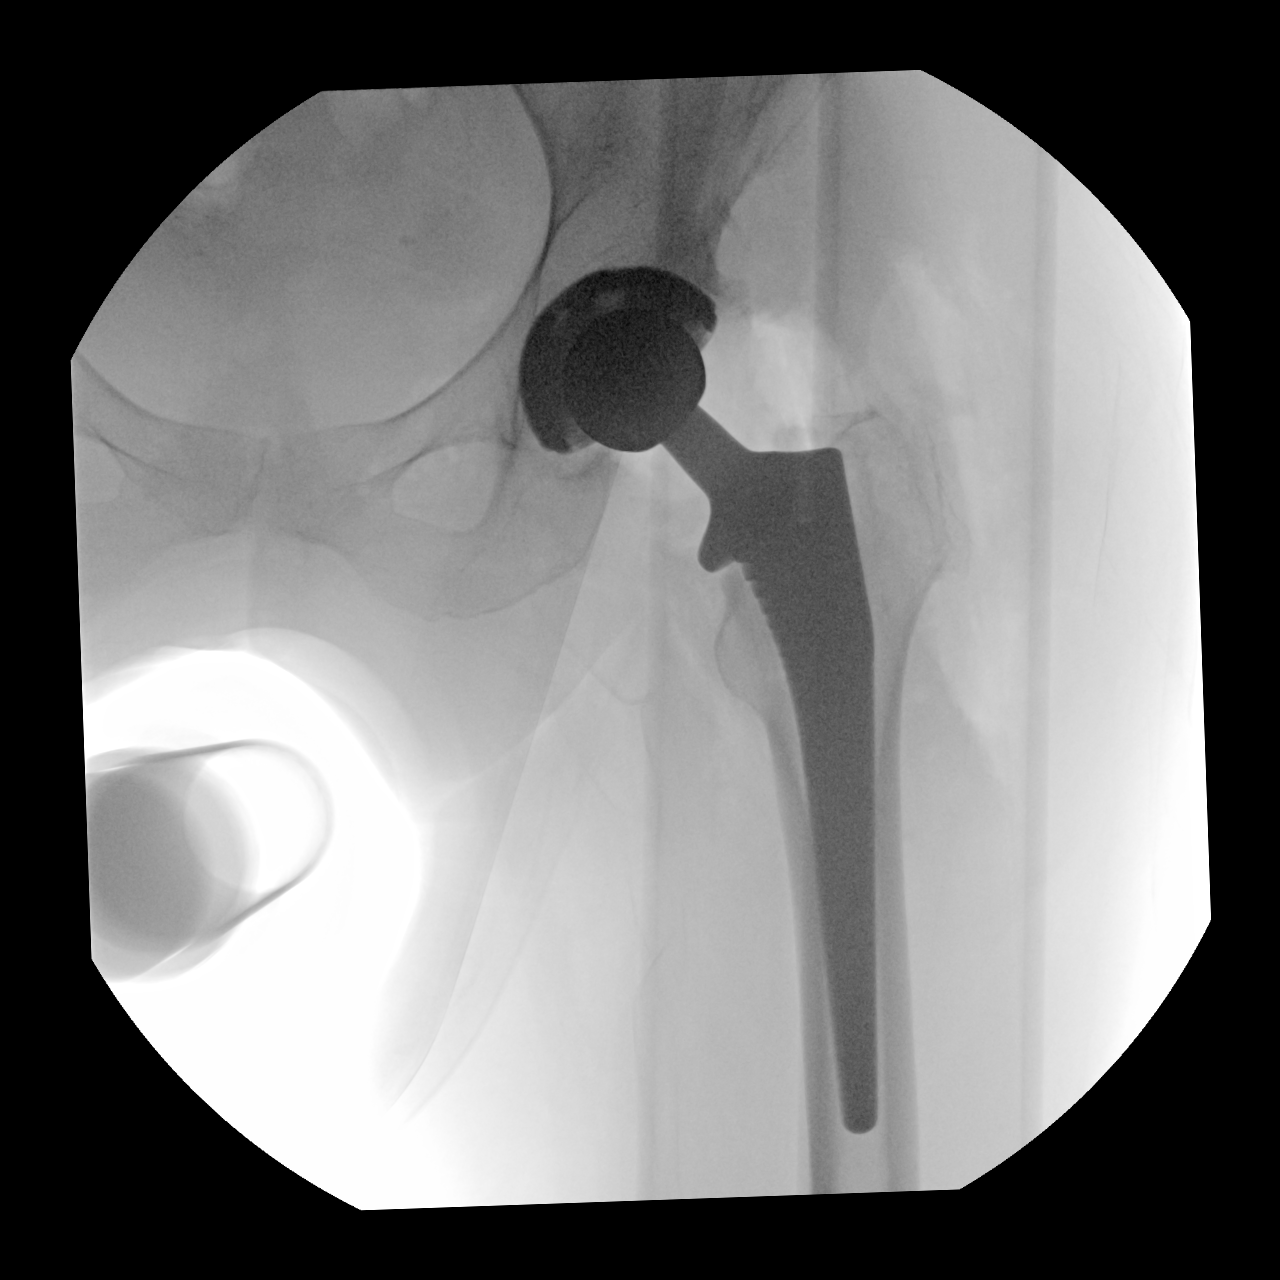
[im 3/3]
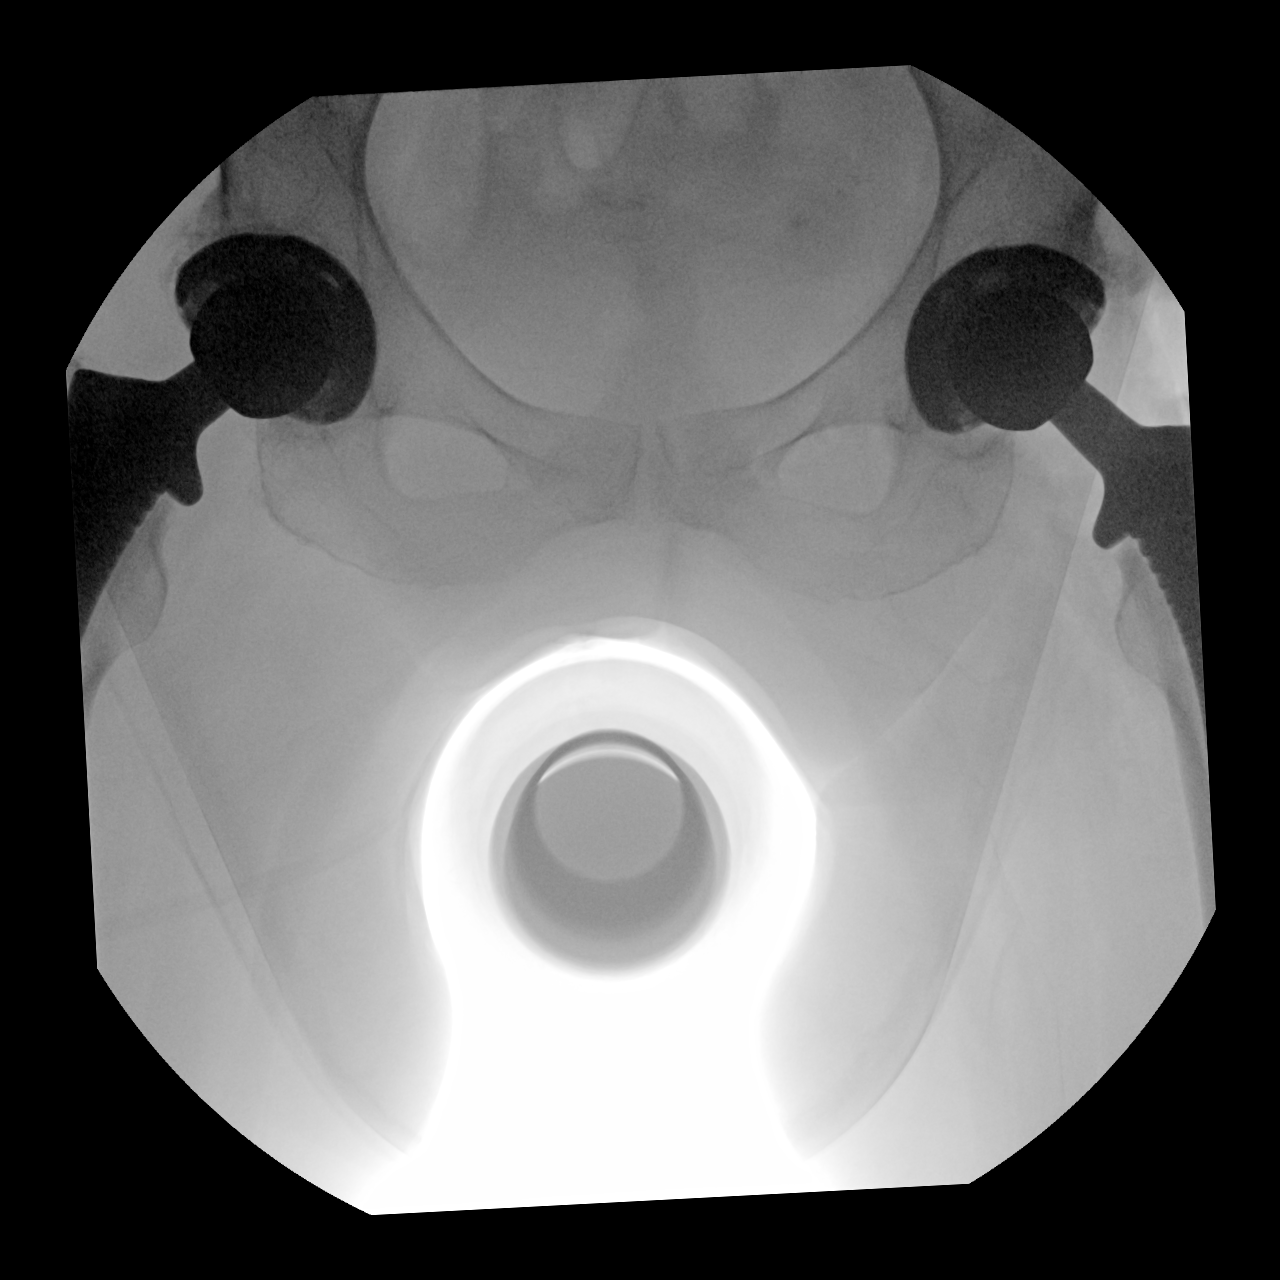

[3 of 3 positions shown; findings below may reference images not displayed]

FINDINGS: There is a total hip replacement on the left with prosthetic
components appearing well-seated on frontal view. No fracture or
dislocation. Total hip replacement also noted on the right with
visualized prosthetic components well-seated.
IMPRESSION: There is no a total hip replacement on each side. Acute
postoperative change noted on the left with prosthetic components on
the left well-seated on frontal view. No acute fracture or
dislocation.

## 2022-10-07 ENCOUNTER — Ambulatory Visit
Admission: EM | Admit: 2022-10-07 | Discharge: 2022-10-07 | Disposition: A | Discharge status: Home / Self Care | Payer: PPO | Attending: Nurse Practitioner | Admitting: Nurse Practitioner

## 2022-10-07 DIAGNOSIS — J069 Acute upper respiratory infection, unspecified: Secondary | ICD-10-CM | POA: Diagnosis present

## 2022-10-07 DIAGNOSIS — Z1152 Encounter for screening for COVID-19: Secondary | ICD-10-CM | POA: Insufficient documentation

## 2022-10-07 LAB — POCT RAPID STREP A (OFFICE): Rapid Strep A Screen: NEGATIVE

## 2022-10-07 MED ORDER — BENZONATATE 100 MG PO CAPS: 100.0000 mg | ORAL_CAPSULE | Freq: Three times a day (TID) | ORAL | 0 refills | Status: DC | PRN

## 2022-10-07 NOTE — ED Triage Notes (Signed)
Pt reports she has left side gland pain, fever, throat pain that makes it hard to swallow, and left ear pain x 2 days. Took ibuprofen which helped with fever.

## 2022-10-07 NOTE — ED Provider Notes (Addendum)
[facility]    CSN: [id number] Arrival date & time: 10/07/22  0843      History   Chief Complaint Chief Complaint  Patient presents with   Sore Throat    HPI Angel Rivera is a 72 y.o. female.   Patient presents today for 2-day history of low-grade fever, dry cough, runny nose, left-sided throat and "gland" pain, left ear pain without drainage, decreased appetite, and fatigue.  She denies body aches or chills, congested cough, shortness of breath or chest pain, stuffy nose, headache, sinus pressure, abdominal pain, nausea/vomiting, and diarrhea.  No known sick contacts.  Has been taking ibuprofen which does seem to help with the ear pain.  Reports she has tinnitus at baseline that is unchanged.  Patient reports she has tested positive for COVID-19 since 2024 began.  Does not recall the month.  She declines antiviral if she were to test positive.    Past Medical History:  Diagnosis Date   Arthritis    Coronary artery disease    NATIVE VESSEL (ICD-414.01)-cath 08/17/09 with subtotally occluded RCA, moderate disease LAD.   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   Unilateral primary osteoarthritis, 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   Unilateral primary osteoarthritis, right hip 03/23/2020   Fracture, [place], left, closed 09/06/17 09/23/2017   Diarrhea 05/16/2017   Encounter for HCV screening test for high risk patient 05/16/2017   Chest pain 06/17/2016   Unstable angina (HCC) 12/30/2015   HYPERCHOLESTEROLEMIA 09/09/2009   MYOCARDIAL INFARCTION, 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   CAD (coronary artery disease) 09/09/2009    HYPOTENSION 09/09/2009    Past Surgical History:  Procedure Laterality Date   ABDOMINAL HYSTERECTOMY  1980s   laparoscopic   CARDIAC CATHETERIZATION N/A 12/30/2015   Procedure: Left Heart Cath and Coronary Angiography;  Surgeon: Kathleene Hazel, MD;  Location: [facility] INVASIVE CV LAB;  Service: Cardiovascular;  Laterality: N/A;   CARDIAC CATHETERIZATION N/A 12/30/2015   Procedure: Coronary Stent Intervention;  Surgeon: Kathleene Hazel, MD;  Location: [facility] INVASIVE CV LAB;  Service: Cardiovascular;  Laterality: N/A;   CORONARY ANGIOPLASTY WITH STENT PLACEMENT  08/17/2009   "1 stent"   KNEE ARTHROSCOPY WITH MEDIAL MENISECTOMY Right 01/30/2022   Procedure: KNEE ARTHROSCOPY WITH MEDIAL MENISECTOMY;  Surgeon: Vickki Hearing, MD;  Location: [facility];  Service: Orthopedics;  Laterality: Right;   TOTAL HIP ARTHROPLASTY Right 06/17/2020   Procedure: RIGHT TOTAL HIP ARTHROPLASTY ANTERIOR APPROACH;  Surgeon: Kathryne Hitch, MD;  Location: [facility];  Service: Orthopedics;  Laterality: Right;   TOTAL HIP ARTHROPLASTY Left 10/21/2020   Procedure: LEFT TOTAL HIP ARTHROPLASTY ANTERIOR APPROACH;  Surgeon: Kathryne Hitch, MD;  Location: [facility];  Service: Orthopedics;  Laterality: Left;   TUBAL LIGATION  ~ 1980    OB History   No obstetric history on file.      Home Medications    Prior to Admission medications   Medication Sig Start Date End Date Taking? Authorizing Provider  benzonatate (TESSALON) 100 MG capsule Take 1 capsule (100 mg total) by mouth 3 (three) times daily as  needed for cough. Do not take with alcohol or while driving or operating heavy machinery.  May cause drowsiness. 10/07/22  Yes Valentino Nose, NP  ascorbic acid (VITAMIN C) 500 MG tablet Take 500 mg by mouth in the morning.    [provider]  aspirin EC 81 MG tablet Take 81 mg by mouth in the morning. Swallow whole.    [provider]  B Complex-C (B-COMPLEX WITH VITAMIN C) tablet Take  1 tablet by mouth in the morning.    [provider]  Biotin 5000 MCG TABS Take 5,000 mcg by mouth in the morning.    [provider]  cholecalciferol (VITAMIN D) 25 MCG (1000 UNIT) tablet Take 1,000 Units by mouth in the morning.    [provider]  LORazepam (ATIVAN) 0.5 MG tablet Take 0.5 mg by mouth at bedtime. 09/10/17   [provider]  lovastatin (MEVACOR) 40 MG tablet TAKE 1 TABLET(40 MG) BY MOUTH AT BEDTIME 06/20/22   Kathleene Hazel, MD  metoprolol succinate (TOPROL-XL) 25 MG 24 hr tablet TAKE 1/2 TABLET(12.5 MG) BY MOUTH DAILY 06/20/22   Kathleene Hazel, MD  milk thistle 175 MG tablet Take 175 mg by mouth in the morning.    [provider]  furosemide (LASIX) 40 MG tablet Take 1 tablet (40 mg total) by mouth daily as needed for edema. 03/24/19 10/21/20  Dyann Kief, PA-C    Family History Family History  Problem Relation Age of Onset   Heart attack Mother    Coronary artery disease Other    Heart disease Father        bypass   Heart disease Brother        bypass    Social History Social History   Tobacco Use   Smoking status: Former    Packs/day: 0.50    Years: 33.00    Additional pack years: 0.00    Total pack years: 16.50    Types: Cigarettes    Quit date: 05/07/2013    Years since quitting: 9.4   Smokeless tobacco: Never  Vaping Use   Vaping Use: Never used  Substance Use Topics   Alcohol use: Not Currently    Comment: 12/30/2015 "might have a drink once/year"   Drug use: No     Allergies   Lipitor [atorvastatin] and Doxycycline   Review of Systems Review of Systems Per HPI  Physical Exam Triage Vital Signs ED Triage Vitals  Enc Vitals Group     BP 10/07/22 0847 132/78     Pulse Rate 10/07/22 0847 77     Resp 10/07/22 0847 18     Temp 10/07/22 0847 98.2 F (36.8 C)     Temp Source 10/07/22 0847 Oral     SpO2 10/07/22 0847 96 %     Weight --      Height --      Head Circumference --       Peak Flow --      Pain Score 10/07/22 0850 8     Pain Loc --      Pain Edu? --      Excl. in GC? --    No data found.  Updated Vital Signs BP 132/78 (BP Location: Right Arm)   Pulse 77   Temp 98.2 F (36.8 C) (Oral)   Resp 18   SpO2 96%   Visual Acuity Right Eye Distance:   Left Eye Distance:   Bilateral Distance:  Right Eye Near:   Left Eye Near:    Bilateral Near:     Physical Exam Vitals and nursing note reviewed.  Constitutional:      General: She is not in acute distress.    Appearance: Normal appearance. She is not ill-appearing or toxic-appearing.  HENT:     Head: Normocephalic and atraumatic.     Right Ear: Tympanic membrane, ear canal and external ear normal. No drainage, swelling or tenderness. No middle ear effusion. Tympanic membrane is not erythematous.     Left Ear: Tympanic membrane, ear canal and external ear normal. No drainage, swelling or tenderness.  No middle ear effusion. Tympanic membrane is not erythematous.     Nose: No congestion or rhinorrhea.     Mouth/Throat:     Mouth: Mucous membranes are moist.     Pharynx: Oropharynx is clear. Posterior oropharyngeal erythema present. No oropharyngeal exudate.  Eyes:     General: No scleral icterus.    Extraocular Movements: Extraocular movements intact.  Cardiovascular:     Rate and Rhythm: Normal rate and regular rhythm.     Heart sounds: Normal heart sounds. No murmur heard. Pulmonary:     Effort: Pulmonary effort is normal. No respiratory distress.     Breath sounds: Normal breath sounds. No wheezing, rhonchi or rales.  Abdominal:     General: Abdomen is flat. Bowel sounds are normal. There is no distension.     Palpations: Abdomen is soft.  Musculoskeletal:     Cervical back: Normal range of motion and neck supple.  Lymphadenopathy:     Cervical: No cervical adenopathy.  Skin:    General: Skin is warm and dry.     Coloration: Skin is not jaundiced or pale.     Findings: No erythema  or rash.  Neurological:     Mental Status: She is alert and oriented to person, place, and time.     Motor: No weakness.  Psychiatric:        Behavior: Behavior is cooperative.      UC Treatments / Results  Labs (all labs ordered are listed, but only abnormal results are displayed) Labs Reviewed  SARS CORONAVIRUS 2 (TAT 6-24 HRS)  POCT RAPID STREP A (OFFICE)    EKG   Radiology No results found.  Procedures Procedures (including critical care time)  Medications Ordered in UC Medications - No data to display  Initial Impression / Assessment and Plan / UC Course  I have reviewed the triage vital signs and the nursing notes.  Pertinent labs & imaging results that were available during my care of the patient were reviewed by me and considered in my medical decision making (see chart for details).   Patient is well-appearing, normotensive, afebrile, not tachycardic, not tachypneic, oxygenating well on room air.    1. Viral URI with cough 2. Encounter for screening for COVID-19 Suspect viral etiology Vital signs and exam today are reassuring COVID19 test is pending Patient declines antiviral therapy if she were to test positive Supportive care discussed Start cough suppressant medication ER and return precautions discussed with patient  The patient was given the opportunity to ask questions.  All questions answered to their satisfaction.  The patient is in agreement to this plan.    Final Clinical Impressions(s) / UC Diagnoses   Final diagnoses:  Viral URI with cough  Encounter for screening for COVID-19     Discharge Instructions      You have a viral upper respiratory infection.  Symptoms should improve over the next week to 10 days.  If you develop chest pain or shortness of breath, go to the emergency room.  We have tested you today for COVID-19.  You will see the results in Mychart and we will call you with positive results.  Please stay home and isolate  until you are aware of the results.    Some things that can make you feel better are: - Increased rest - Increasing fluid with water/sugar free electrolytes - Acetaminophen and ibuprofen as needed for fever/pain - Salt water gargling, chloraseptic spray and throat lozenges - OTC guaifenesin (Mucinex) 600 mg twice daily for congestion - Saline sinus flushes or a neti pot - Humidifying the air -Tessalon Perles as needed for dry cough     ED Prescriptions     Medication Sig Dispense Auth. Provider   benzonatate (TESSALON) 100 MG capsule Take 1 capsule (100 mg total) by mouth 3 (three) times daily as needed for cough. Do not take with alcohol or while driving or operating heavy machinery.  May cause drowsiness. 21 capsule Valentino Nose, NP      PDMP not reviewed this encounter.   Valentino Nose, NP 10/07/22 0919    Valentino Nose, NP 10/07/22 [phone]

## 2022-10-07 NOTE — Discharge Instructions (Addendum)
You have a viral upper respiratory infection.  Symptoms should improve over the next week to 10 days.  If you develop chest pain or shortness of breath, go to the emergency room.  We have tested you today for COVID-19.  You will see the results in Mychart and we will call you with positive results.  Please stay home and isolate until you are aware of the results.    Some things that can make you feel better are: - Increased rest - Increasing fluid with water/sugar free electrolytes - Acetaminophen and ibuprofen as needed for fever/pain - Salt water gargling, chloraseptic spray and throat lozenges - OTC guaifenesin (Mucinex) 600 mg twice daily for congestion - Saline sinus flushes or a neti pot - Humidifying the air -Tessalon Perles as needed for dry cough

## 2022-10-15 LAB — SARS CORONAVIRUS 2 (TAT 6-24 HRS)

## 2022-10-17 ENCOUNTER — Encounter: Payer: Self-pay | Admitting: Orthopaedic Surgery

## 2022-10-17 ENCOUNTER — Ambulatory Visit (INDEPENDENT_AMBULATORY_CARE_PROVIDER_SITE_OTHER): Payer: PPO | Admitting: Orthopaedic Surgery

## 2022-10-17 ENCOUNTER — Other Ambulatory Visit (INDEPENDENT_AMBULATORY_CARE_PROVIDER_SITE_OTHER): Payer: PPO

## 2022-10-17 DIAGNOSIS — M7061 Trochanteric bursitis, right hip: Secondary | ICD-10-CM

## 2022-10-17 DIAGNOSIS — Z96641 Presence of right artificial hip joint: Secondary | ICD-10-CM | POA: Diagnosis not present

## 2022-10-17 MED ORDER — METHYLPREDNISOLONE ACETATE 40 MG/ML IJ SUSP
40.0000 mg | INTRAMUSCULAR | Status: AC | PRN
  Administered 2022-10-17: 40 mg via INTRA_ARTICULAR

## 2022-10-17 MED ORDER — LIDOCAINE HCL 1 % IJ SOLN
3.0000 mL | INTRAMUSCULAR | Status: AC | PRN
  Administered 2022-10-17: 3 mL

## 2022-10-17 MED ORDER — CELECOXIB 200 MG PO CAPS: 200.0000 mg | ORAL_CAPSULE | Freq: Two times a day (BID) | ORAL | 3 refills | Status: DC | PRN

## 2022-10-17 NOTE — Progress Notes (Signed)
The patient is someone well-known to me.  We replaced both of her hips close to get her back in 2022.  She has just a slight leg length discrepancy with the right side just only slightly longer than the left.  That has not been an issue until recently.  She has been having pain for about 2 or 3 months now over the trochanteric area of her right hip.  There is no groin pain and no pain on the left hip.  She denies any specific injuries.  She is 71 and active.  She occasionally will wear a small insert in her left shoe.  On exam both hips move smoothly and fluidly.  She is very tender to palpation over the right hip trochanteric area.  There is no redness over this area.  She is very sensitive over the proximal IT band as well.  An AP pelvis and lateral of the right hip shows bilateral total hip arthroplasties with no complicating features.  There are cortical irregularities around both trochanteric areas suggesting chronic trochanteric bursitis but she is asymptomatic on the left side.  I did show her stretching exercises to try and I would like her to apply Voltaren gel over this area 2-3 times daily.  I did offer steroid injection as well and she agreed to this and tolerated it well around the trochanteric area.  We will start her on Celebrex as an anti-inflammatory.  My next step would be her considering physical therapy if this is not getting better.  She will let us know.     Procedure Note  Patient: Angel Rivera             Date of Birth: 08-27-1950           MRN: [id number]             Visit Date: 10/17/2022  Procedures: Visit Diagnoses:  1. History of right hip replacement   2. Trochanteric bursitis, right hip     Large Joint Inj: R greater trochanter on 10/17/2022 3:20 PM Indications: pain and diagnostic evaluation Details: 22 G 1.5 in needle, lateral approach  Arthrogram: No  Medications: 3 mL lidocaine 1 %; 40 mg methylPREDNISolone acetate 40 MG/ML Outcome: tolerated well, no  immediate complications Procedure, treatment alternatives, risks and benefits explained, specific risks discussed. Consent was given by the patient. Immediately prior to procedure a time out was called to verify the correct patient, procedure, equipment, support staff and site/side marked as required. Patient was prepped and draped in the usual sterile fashion.

## 2023-01-30 ENCOUNTER — Ambulatory Visit
Admission: RE | Admit: 2023-01-30 | Discharge: 2023-01-30 | Disposition: A | Discharge status: Home / Self Care | Payer: PPO | Source: Ambulatory Visit | Attending: Family Medicine | Admitting: Family Medicine

## 2023-01-30 VITALS — BP 124/73 | HR 82 | Temp 98.7°F | Resp 14

## 2023-01-30 DIAGNOSIS — R059 Cough, unspecified: Secondary | ICD-10-CM | POA: Diagnosis present

## 2023-01-30 DIAGNOSIS — B9789 Other viral agents as the cause of diseases classified elsewhere: Secondary | ICD-10-CM | POA: Diagnosis not present

## 2023-01-30 DIAGNOSIS — Z1152 Encounter for screening for COVID-19: Secondary | ICD-10-CM | POA: Insufficient documentation

## 2023-01-30 DIAGNOSIS — J069 Acute upper respiratory infection, unspecified: Secondary | ICD-10-CM | POA: Insufficient documentation

## 2023-01-30 LAB — POCT INFLUENZA A/B
Influenza A, POC: NEGATIVE
Influenza B, POC: NEGATIVE

## 2023-01-30 MED ORDER — PROMETHAZINE-DM 6.25-15 MG/5ML PO SYRP: 5.0000 mL | ORAL_SOLUTION | Freq: Four times a day (QID) | ORAL | 0 refills | Status: DC | PRN

## 2023-01-30 MED ORDER — ALBUTEROL SULFATE HFA 108 (90 BASE) MCG/ACT IN AERS: 2.0000 | INHALATION_SPRAY | RESPIRATORY_TRACT | 0 refills | Status: DC | PRN

## 2023-01-30 NOTE — ED Triage Notes (Signed)
Pt c/o fever, cough, hoarseness and body aches,  x 2 days, home COVID test was negative this morning. Pt has been using Ibuprofen, fever comes  in the evenings

## 2023-01-30 NOTE — Discharge Instructions (Signed)
You may take over-the-counter medications such as Coricidin HBP, Flonase, plain Mucinex in addition to the prescribed medications.  Your COVID test should be back tomorrow.

## 2023-01-30 NOTE — ED Provider Notes (Signed)
[facility]    CSN: [id number] Arrival date & time: 01/30/23  1437      History   Chief Complaint Chief Complaint  Patient presents with   Generalized Body Aches    Entered by patient    HPI Angel Rivera is a 72 y.o. female.   Patient presenting today with 2-day history of fever, cough, hoarseness, body aches, fatigue, chest tightness.  Denies chest pain, shortness of breath, abdominal pain, nausea vomiting or diarrhea.  Taking ibuprofen with minimal relief.  Home COVID test negative.  No known history of chronic pulmonary disease.    Past Medical History:  Diagnosis Date   Arthritis    Coronary artery disease    NATIVE VESSEL (ICD-414.01)-cath 08/17/09 with subtotally occluded RCA, moderate disease LAD.   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   Unilateral primary osteoarthritis, 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   Unilateral primary osteoarthritis, right hip 03/23/2020   Fracture, [place], left, closed 09/06/17 09/23/2017   Diarrhea 05/16/2017   Encounter for HCV screening test for high risk patient 05/16/2017   Chest pain 06/17/2016   Unstable angina (HCC) 12/30/2015   HYPERCHOLESTEROLEMIA 09/09/2009   MYOCARDIAL INFARCTION, 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   CAD (coronary artery disease) 09/09/2009   HYPOTENSION 09/09/2009    Past Surgical History:  Procedure Laterality Date   ABDOMINAL HYSTERECTOMY  1980s   laparoscopic   CARDIAC CATHETERIZATION N/A 12/30/2015   Procedure: Left Heart Cath and Coronary Angiography;  Surgeon: Kathleene Hazel, MD;  Location: [facility] INVASIVE CV LAB;  Service: Cardiovascular;   Laterality: N/A;   CARDIAC CATHETERIZATION N/A 12/30/2015   Procedure: Coronary Stent Intervention;  Surgeon: Kathleene Hazel, MD;  Location: [facility] INVASIVE CV LAB;  Service: Cardiovascular;  Laterality: N/A;   CORONARY ANGIOPLASTY WITH STENT PLACEMENT  08/17/2009   "1 stent"   KNEE ARTHROSCOPY WITH MEDIAL MENISECTOMY Right 01/30/2022   Procedure: KNEE ARTHROSCOPY WITH MEDIAL MENISECTOMY;  Surgeon: Vickki Hearing, MD;  Location: [facility];  Service: Orthopedics;  Laterality: Right;   TOTAL HIP ARTHROPLASTY Right 06/17/2020   Procedure: RIGHT TOTAL HIP ARTHROPLASTY ANTERIOR APPROACH;  Surgeon: Kathryne Hitch, MD;  Location: [facility];  Service: Orthopedics;  Laterality: Right;   TOTAL HIP ARTHROPLASTY Left 10/21/2020   Procedure: LEFT TOTAL HIP ARTHROPLASTY ANTERIOR APPROACH;  Surgeon: Kathryne Hitch, MD;  Location: [facility];  Service: Orthopedics;  Laterality: Left;   TUBAL LIGATION  ~ 1980    OB History   No obstetric history on file.      Home Medications    Prior to Admission medications   Medication Sig Start Date End Date Taking? Authorizing Provider  albuterol (VENTOLIN HFA) 108 (90 Base) MCG/ACT inhaler Inhale 2 puffs into the lungs every 4 (four) hours as needed for wheezing or shortness of breath. 01/30/23  Yes Particia Nearing, PA-C  promethazine-dextromethorphan (PROMETHAZINE-DM) 6.25-15 MG/5ML syrup Take 5 mLs by mouth 4 (four) times daily as needed. 01/30/23  Yes Particia Nearing, PA-C  ascorbic acid (VITAMIN C) 500 MG tablet Take 500 mg by mouth in the morning.  [provider]  aspirin EC 81 MG tablet Take 81 mg by mouth in the morning. Swallow whole.    [provider]  B Complex-C (B-COMPLEX WITH VITAMIN C) tablet Take 1 tablet by mouth in the morning.    [provider]  benzonatate (TESSALON) 100 MG capsule Take 1 capsule (100 mg total) by mouth 3 (three) times daily as needed for cough. Do not take with alcohol or  while driving or operating heavy machinery.  May cause drowsiness. 10/07/22   Valentino Nose, NP  Biotin 5000 MCG TABS Take 5,000 mcg by mouth in the morning.    [provider]  celecoxib (CELEBREX) 200 MG capsule Take 1 capsule (200 mg total) by mouth 2 (two) times daily between meals as needed. 10/17/22   Kathryne Hitch, MD  cholecalciferol (VITAMIN D) 25 MCG (1000 UNIT) tablet Take 1,000 Units by mouth in the morning.    [provider]  LORazepam (ATIVAN) 0.5 MG tablet Take 0.5 mg by mouth at bedtime. 09/10/17   [provider]  lovastatin (MEVACOR) 40 MG tablet TAKE 1 TABLET(40 MG) BY MOUTH AT BEDTIME 06/20/22   Kathleene Hazel, MD  metoprolol succinate (TOPROL-XL) 25 MG 24 hr tablet TAKE 1/2 TABLET(12.5 MG) BY MOUTH DAILY 06/20/22   Kathleene Hazel, MD  milk thistle 175 MG tablet Take 175 mg by mouth in the morning.    [provider]  furosemide (LASIX) 40 MG tablet Take 1 tablet (40 mg total) by mouth daily as needed for edema. 03/24/19 10/21/20  Dyann Kief, PA-C    Family History Family History  Problem Relation Age of Onset   Heart attack Mother    Coronary artery disease Other    Heart disease Father        bypass   Heart disease Brother        bypass    Social History Social History   Tobacco Use   Smoking status: Former    Current packs/day: 0.00    Average packs/day: 0.5 packs/day for 33.0 years (16.5 ttl pk-yrs)    Types: Cigarettes    Start date: 05/07/1980    Quit date: 05/07/2013    Years since quitting: 9.7   Smokeless tobacco: Never  Vaping Use   Vaping status: Never Used  Substance Use Topics   Alcohol use: Not Currently    Comment: 12/30/2015 "might have a drink once/year"   Drug use: No     Allergies   Lipitor [atorvastatin] and Doxycycline   Review of Systems Review of Systems Per HPI  Physical Exam Triage Vital Signs ED Triage Vitals  Encounter Vitals Group     BP 01/30/23 1511  124/73     Systolic BP Percentile --      Diastolic BP Percentile --      Pulse Rate 01/30/23 1511 82     Resp 01/30/23 1511 14     Temp 01/30/23 1511 98.7 F (37.1 C)     Temp Source 01/30/23 1511 Oral     SpO2 01/30/23 1511 98 %     Weight --      Height --      Head Circumference --      Peak Flow --      Pain Score 01/30/23 1513 0     Pain Loc --      Pain Education --      Exclude from Growth Chart --    No data found.  Updated Vital Signs BP 124/73 (BP Location: Right Arm)   Pulse 82   Temp 98.7 F (37.1 C) (Oral)   Resp 14   SpO2 98%   Visual Acuity Right Eye Distance:   Left Eye Distance:   Bilateral Distance:    Right Eye Near:   Left Eye Near:    Bilateral Near:     Physical Exam Vitals and nursing note reviewed.  Constitutional:      Appearance: Normal appearance. She is not ill-appearing.  HENT:     Head: Atraumatic.     Right Ear: Tympanic membrane and external ear normal.     Left Ear: Tympanic membrane and external ear normal.     Nose: Rhinorrhea present.     Mouth/Throat:     Mouth: Mucous membranes are moist.     Pharynx: Posterior oropharyngeal erythema present.  Eyes:     Extraocular Movements: Extraocular movements intact.     Conjunctiva/sclera: Conjunctivae normal.  Cardiovascular:     Rate and Rhythm: Normal rate and regular rhythm.     Heart sounds: Normal heart sounds.  Pulmonary:     Effort: Pulmonary effort is normal.     Breath sounds: Wheezing present. No rales.     Comments: Scant wheezes Musculoskeletal:        General: Normal range of motion.     Cervical back: Normal range of motion and neck supple.  Skin:    General: Skin is warm and dry.  Neurological:     Mental Status: She is alert and oriented to person, place, and time.  Psychiatric:        Mood and Affect: Mood normal.        Thought Content: Thought content normal.        Judgment: Judgment normal.      UC Treatments / Results  Labs (all labs ordered  are listed, but only abnormal results are displayed) Labs Reviewed  SARS CORONAVIRUS 2 (TAT 6-24 HRS)  POCT INFLUENZA A/B    EKG   Radiology No results found.  Procedures Procedures (including critical care time)  Medications Ordered in UC Medications - No data to display  Initial Impression / Assessment and Plan / UC Course  I have reviewed the triage vital signs and the nursing notes.  Pertinent labs & imaging results that were available during my care of the patient were reviewed by me and considered in my medical decision making (see chart for details).     Vitals and exam overall reassuring today, suspicious for viral respiratory infection likely COVID-19.  COVID testing pending, rapid flu is negative in clinic.  Treat with Phenergan DM, albuterol inhaler as needed, over-the-counter supportive medications and home care.  Return for worsening symptoms.  Final Clinical Impressions(s) / UC Diagnoses   Final diagnoses:  Viral URI with cough     Discharge Instructions      You may take over-the-counter medications such as Coricidin HBP, Flonase, plain Mucinex in addition to the prescribed medications.  Your COVID test should be back tomorrow.    ED Prescriptions     Medication Sig Dispense Auth. Provider   promethazine-dextromethorphan (PROMETHAZINE-DM) 6.25-15 MG/5ML syrup Take 5 mLs by mouth 4 (four) times daily as needed. 100 mL Particia Nearing, PA-C   albuterol (VENTOLIN HFA) 108 (90 Base) MCG/ACT inhaler Inhale 2 puffs into the lungs every 4 (four) hours as needed for wheezing or shortness of breath. 18 g Particia Nearing, [place]  PDMP not reviewed this encounter.   Particia Nearing, [place] 01/30/23 [phone]

## 2023-01-31 LAB — SARS CORONAVIRUS 2 (TAT 6-24 HRS): SARS Coronavirus 2: NEGATIVE

## 2023-02-18 ENCOUNTER — Other Ambulatory Visit (HOSPITAL_COMMUNITY): Payer: Self-pay | Admitting: Internal Medicine

## 2023-02-18 DIAGNOSIS — Z1231 Encounter for screening mammogram for malignant neoplasm of breast: Secondary | ICD-10-CM

## 2023-02-28 ENCOUNTER — Encounter: Payer: Self-pay | Admitting: Internal Medicine

## 2023-02-28 ENCOUNTER — Ambulatory Visit (INDEPENDENT_AMBULATORY_CARE_PROVIDER_SITE_OTHER): Payer: PPO | Admitting: Internal Medicine

## 2023-02-28 VITALS — BP 120/67 | HR 72 | Temp 98.3°F | Ht 64.0 in | Wt 138.2 lb

## 2023-02-28 DIAGNOSIS — E7800 Pure hypercholesterolemia, unspecified: Secondary | ICD-10-CM

## 2023-02-28 DIAGNOSIS — G4700 Insomnia, unspecified: Secondary | ICD-10-CM | POA: Insufficient documentation

## 2023-02-28 DIAGNOSIS — I2510 Atherosclerotic heart disease of native coronary artery without angina pectoris: Secondary | ICD-10-CM

## 2023-02-28 DIAGNOSIS — Z1321 Encounter for screening for nutritional disorder: Secondary | ICD-10-CM

## 2023-02-28 DIAGNOSIS — Z131 Encounter for screening for diabetes mellitus: Secondary | ICD-10-CM

## 2023-02-28 DIAGNOSIS — Z23 Encounter for immunization: Secondary | ICD-10-CM | POA: Diagnosis not present

## 2023-02-28 DIAGNOSIS — F5104 Psychophysiologic insomnia: Secondary | ICD-10-CM

## 2023-02-28 DIAGNOSIS — Z79899 Other long term (current) drug therapy: Secondary | ICD-10-CM

## 2023-02-28 DIAGNOSIS — Z1329 Encounter for screening for other suspected endocrine disorder: Secondary | ICD-10-CM

## 2023-02-28 NOTE — Addendum Note (Signed)
Addended by: Adella Hare B on: 02/28/2023 10:48 AM   Modules accepted: Orders

## 2023-02-28 NOTE — Assessment & Plan Note (Signed)
She endorses a history of insomnia.  Currently prescribed lorazepam 0.5 mg nightly.  This has been managed by her PCP.  PDMP reviewed and is appropriate. -Controlled substance agreement signed today.  UDS pending.

## 2023-02-28 NOTE — Assessment & Plan Note (Signed)
History of CAD with NSTEMI in 2011.  Underwent LHC with distal RCA stenting.  Underwent repeat LHC in 2017 in the setting of anginal equivalent.  Found to have severe stenosis of the mid RCA, treated with stenting.  Denies recent chest pain.  Followed by cardiology (Dr. Clifton Haupt).  Currently prescribed ASA 81 mg daily, Toprol-XL 12.5 mg daily, and lovastatin 40 mg daily.

## 2023-02-28 NOTE — Patient Instructions (Signed)
It was a pleasure to see you today.  Thank you for giving [place] the opportunity to be involved in your care.  Below is a brief recap of your visit and next steps.  We will plan to see you again in 1 year.  Summary You have established care today No medication changes have been made Basic labs ordered Controlled substance agreement and UDS pending Follow up in 1 year

## 2023-02-28 NOTE — Assessment & Plan Note (Signed)
Influenza vaccine administered today.

## 2023-02-28 NOTE — Assessment & Plan Note (Signed)
Currently prescribed lovastatin 40 mg daily.  Repeat lipid panel ordered today.

## 2023-02-28 NOTE — Progress Notes (Signed)
New Patient Office Visit  Subjective    Patient ID: Angel Rivera, female    DOB: [birth year]  Age: 72 y.o. MRN: [id number]  CC:  Chief Complaint  Patient presents with   Establish Care    HPI Angel Rivera presents to establish care.  She is a 72 year old woman who endorses a past medical history significant for CAD with NSTEMI (2011) and PCI with stenting to RCA (2011 and 2017), HLD, and insomnia.  Previously followed by Dr. Sudie Bailey.  Mr. Bault reports feeling well today.  She is asymptomatic and has no acute concerns to discuss aside from desiring to establish care.  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  She endorses former tobacco use, quitting in 2015.  Denies alcohol and illicit drug use.  Her family medical history is significant for HTN and CAD.  Chronic medical conditions and outstanding preventative care items discussed today are individually addressed in A/P below.   Outpatient Encounter Medications as of 02/28/2023  Medication Sig   ascorbic acid (VITAMIN C) 500 MG tablet Take 500 mg by mouth in the morning.   aspirin EC 81 MG tablet Take 81 mg by mouth in the morning. Swallow whole.   B Complex-C (B-COMPLEX WITH VITAMIN C) tablet Take 1 tablet by mouth in the morning.   Biotin 5000 MCG TABS Take 5,000 mcg by mouth in the morning.   cholecalciferol (VITAMIN D) 25 MCG (1000 UNIT) tablet Take 1,000 Units by mouth in the morning.   LORazepam (ATIVAN) 0.5 MG tablet Take 0.5 mg by mouth at bedtime.   lovastatin (MEVACOR) 40 MG tablet TAKE 1 TABLET(40 MG) BY MOUTH AT BEDTIME   metoprolol succinate (TOPROL-XL) 25 MG 24 hr tablet TAKE 1/2 TABLET(12.5 MG) BY MOUTH DAILY   milk thistle 175 MG tablet Take 175 mg by mouth in the morning.   [DISCONTINUED] albuterol (VENTOLIN HFA) 108 (90 Base) MCG/ACT inhaler Inhale 2 puffs into the lungs every 4 (four) hours as needed for wheezing or shortness of breath.   [DISCONTINUED] benzonatate  (TESSALON) 100 MG capsule Take 1 capsule (100 mg total) by mouth 3 (three) times daily as needed for cough. Do not take with alcohol or while driving or operating heavy machinery.  May cause drowsiness.   [DISCONTINUED] celecoxib (CELEBREX) 200 MG capsule Take 1 capsule (200 mg total) by mouth 2 (two) times daily between meals as needed.   [DISCONTINUED] furosemide (LASIX) 40 MG tablet Take 1 tablet (40 mg total) by mouth daily as needed for edema.   [DISCONTINUED] promethazine-dextromethorphan (PROMETHAZINE-DM) 6.25-15 MG/5ML syrup Take 5 mLs by mouth 4 (four) times daily as needed.   No facility-administered encounter medications on file as of 02/28/2023.    Past Medical History:  Diagnosis Date   Arthritis    Coronary artery disease    NATIVE VESSEL (ICD-414.01)-cath 08/17/09 with subtotally occluded RCA, moderate disease LAD.   Hyperlipidemia    Hypertension    Hypotension    Myocardial infarction (HCC) 08/17/2009   Tobacco user     Past Surgical History:  Procedure Laterality Date   ABDOMINAL HYSTERECTOMY  1980s   laparoscopic   CARDIAC CATHETERIZATION N/A 12/30/2015   Procedure: Left Heart Cath and Coronary Angiography;  Surgeon: Kathleene Hazel, MD;  Location: [facility] INVASIVE CV LAB;  Service: Cardiovascular;  Laterality: N/A;   CARDIAC CATHETERIZATION N/A 12/30/2015   Procedure: Coronary Stent Intervention;  Surgeon: Kathleene Hazel, MD;  Location: [facility] INVASIVE CV  LAB;  Service: Cardiovascular;  Laterality: N/A;   CORONARY ANGIOPLASTY WITH STENT PLACEMENT  08/17/2009   "1 stent"   KNEE ARTHROSCOPY WITH MEDIAL MENISECTOMY Right 01/30/2022   Procedure: KNEE ARTHROSCOPY WITH MEDIAL MENISECTOMY;  Surgeon: Vickki Hearing, MD;  Location: [facility];  Service: Orthopedics;  Laterality: Right;   TOTAL HIP ARTHROPLASTY Right 06/17/2020   Procedure: RIGHT TOTAL HIP ARTHROPLASTY ANTERIOR APPROACH;  Surgeon: Kathryne Hitch, MD;  Location: [facility];  Service: Orthopedics;   Laterality: Right;   TOTAL HIP ARTHROPLASTY Left 10/21/2020   Procedure: LEFT TOTAL HIP ARTHROPLASTY ANTERIOR APPROACH;  Surgeon: Kathryne Hitch, MD;  Location: [facility];  Service: Orthopedics;  Laterality: Left;   TUBAL LIGATION  ~ 1980    Family History  Problem Relation Age of Onset   Hypertension Mother    Heart attack Mother    Heart disease Father        bypass   Heart disease Brother        bypass   Coronary artery disease Other     Social History   Socioeconomic History   Marital status: Divorced    Spouse name: Not on file   Number of children: 2   Years of education: Not on file   Highest education level: Not on file  Occupational History   Not on file  Tobacco Use   Smoking status: Former    Current packs/day: 0.00    Average packs/day: 0.5 packs/day for 33.0 years (16.5 ttl pk-yrs)    Types: Cigarettes    Start date: 05/07/1980    Quit date: 05/07/2013    Years since quitting: 9.8   Smokeless tobacco: Never  Vaping Use   Vaping status: Never Used  Substance and Sexual Activity   Alcohol use: Not Currently    Comment: 12/30/2015 "might have a drink once/year"   Drug use: No   Sexual activity: Not on file  Other Topics Concern   Not on file  Social History Narrative   The patient has a almost 1-pack per day tobacco use     history.     She occasionally has alcohol-no abuse.     No illicit drug use   Divorced, 2 children   Social Determinants of Corporate investment banker Strain: Not on file  Food Insecurity: Not on file  Transportation Needs: Not on file  Physical Activity: Not on file  Stress: Not on file  Social Connections: Not on file  Intimate Partner Violence: Not on file    Review of Systems  Constitutional:  Negative for chills and fever.  HENT:  Negative for sore throat.   Respiratory:  Negative for cough and shortness of breath.   Cardiovascular:  Negative for chest pain, palpitations and leg swelling.  Gastrointestinal:   Negative for abdominal pain, blood in stool, constipation, diarrhea, nausea and vomiting.  Genitourinary:  Negative for dysuria and hematuria.  Musculoskeletal:  Negative for myalgias.  Skin:  Negative for itching and rash.  Neurological:  Negative for dizziness and headaches.  Psychiatric/Behavioral:  Negative for depression and suicidal ideas.         Objective    BP 120/67   Pulse 72   Temp 98.3 F (36.8 C)   Ht 5\' 4"  (1.626 m)   Wt 138 lb 3.2 oz (62.7 kg)   SpO2 97%   BMI 23.72 kg/m   Physical Exam Vitals reviewed.  Constitutional:      General: She is not in acute  distress.    Appearance: Normal appearance. She is not toxic-appearing.  HENT:     Head: Normocephalic and atraumatic.     Right Ear: External ear normal.     Left Ear: External ear normal.     Nose: Nose normal. No congestion or rhinorrhea.     Mouth/Throat:     Mouth: Mucous membranes are moist.     Pharynx: Oropharynx is clear. No oropharyngeal exudate or posterior oropharyngeal erythema.  Eyes:     General: No scleral icterus.    Extraocular Movements: Extraocular movements intact.     Conjunctiva/sclera: Conjunctivae normal.     Pupils: Pupils are equal, round, and reactive to light.  Cardiovascular:     Rate and Rhythm: Normal rate and regular rhythm.     Pulses: Normal pulses.     Heart sounds: Normal heart sounds. No murmur heard.    No friction rub. No gallop.  Pulmonary:     Effort: Pulmonary effort is normal.     Breath sounds: Normal breath sounds. No wheezing, rhonchi or rales.  Abdominal:     General: Abdomen is flat. Bowel sounds are normal. There is no distension.     Palpations: Abdomen is soft.     Tenderness: There is no abdominal tenderness.  Musculoskeletal:        General: No swelling. Normal range of motion.     Cervical back: Normal range of motion.     Right lower leg: No edema.     Left lower leg: No edema.  Lymphadenopathy:     Cervical: No cervical adenopathy.   Skin:    General: Skin is warm and dry.     Capillary Refill: Capillary refill takes less than 2 seconds.     Coloration: Skin is not jaundiced.  Neurological:     General: No focal deficit present.     Mental Status: She is alert and oriented to person, place, and time.  Psychiatric:        Mood and Affect: Mood normal.        Behavior: Behavior normal.    Assessment & Plan:   Problem List Items Addressed This Visit       CAD (coronary artery disease) - Primary    History of CAD with NSTEMI in 2011.  Underwent LHC with distal RCA stenting.  Underwent repeat LHC in 2017 in the setting of anginal equivalent.  Found to have severe stenosis of the mid RCA, treated with stenting.  Denies recent chest pain.  Followed by cardiology (Dr. Clifton Rugg).  Currently prescribed ASA 81 mg daily, Toprol-XL 12.5 mg daily, and lovastatin 40 mg daily.      HYPERCHOLESTEROLEMIA    Currently prescribed lovastatin 40 mg daily.  Repeat lipid panel ordered today.      Insomnia    She endorses a history of insomnia.  Currently prescribed lorazepam 0.5 mg nightly.  This has been managed by her PCP.  PDMP reviewed and is appropriate. -Controlled substance agreement signed today.  UDS pending.      Need for influenza vaccination    Influenza vaccine administered today      Return in about 1 year (around 02/28/2024).   Billie Lade, MD

## 2023-03-01 LAB — HEMOGLOBIN A1C
Est. average glucose Bld gHb Est-mCnc: 111 mg/dL
Hgb A1c MFr Bld: 5.5 % (ref 4.8–5.6)

## 2023-03-01 LAB — CMP14+EGFR
ALT: 21 [IU]/L (ref 0–32)
AST: 35 [IU]/L (ref 0–40)
Albumin: 4.2 g/dL (ref 3.8–4.8)
Alkaline Phosphatase: 97 [IU]/L (ref 44–121)
BUN/Creatinine Ratio: 13 (ref 12–28)
BUN: 13 mg/dL (ref 8–27)
Bilirubin Total: 0.6 mg/dL (ref 0.0–1.2)
CO2: 22 mmol/L (ref 20–29)
Calcium: 9.2 mg/dL (ref 8.7–10.3)
Chloride: 104 mmol/L (ref 96–106)
Creatinine, Ser: 0.99 mg/dL (ref 0.57–1.00)
Globulin, Total: 2.5 g/dL (ref 1.5–4.5)
Glucose: 92 mg/dL (ref 70–99)
Potassium: 4.1 mmol/L (ref 3.5–5.2)
Sodium: 141 mmol/L (ref 134–144)
Total Protein: 6.7 g/dL (ref 6.0–8.5)
eGFR: 60 mL/min/{1.73_m2} (ref >59)

## 2023-03-01 LAB — CBC WITH DIFFERENTIAL/PLATELET
Basophils Absolute: 0.1 10*3/uL (ref 0.0–0.2)
Basos: 1 %
EOS (ABSOLUTE): 0.2 10*3/uL (ref 0.0–0.4)
Eos: 4 %
Hematocrit: 40.8 % (ref 34.0–46.6)
Hemoglobin: 13.3 g/dL (ref 11.1–15.9)
Immature Grans (Abs): 0 10*3/uL (ref 0.0–0.1)
Immature Granulocytes: 0 %
Lymphocytes Absolute: 1.4 10*3/uL (ref 0.7–3.1)
Lymphs: 25 %
MCH: 31.7 pg (ref 26.6–33.0)
MCHC: 32.6 g/dL (ref 31.5–35.7)
MCV: 97 fL (ref 79–97)
Monocytes Absolute: 0.6 10*3/uL (ref 0.1–0.9)
Monocytes: 10 %
Neutrophils Absolute: 3.3 10*3/uL (ref 1.4–7.0)
Neutrophils: 60 %
Platelets: 240 10*3/uL (ref 150–450)
RBC: 4.19 x10E6/uL (ref 3.77–5.28)
RDW: 12.1 % (ref 11.7–15.4)
WBC: 5.6 10*3/uL (ref 3.4–10.8)

## 2023-03-01 LAB — VITAMIN D 25 HYDROXY (VIT D DEFICIENCY, FRACTURES): Vit D, 25-Hydroxy: 73.3 ng/mL (ref 30.0–100.0)

## 2023-03-01 LAB — B12 AND FOLATE PANEL
Folate: >20 ng/mL (ref >3.0)
Vitamin B-12: 747 pg/mL (ref 232–1245)

## 2023-03-01 LAB — LIPID PANEL
Chol/HDL Ratio: 2.2 ratio (ref 0.0–4.4)
Cholesterol, Total: 120 mg/dL (ref 100–199)
HDL: 55 mg/dL (ref >39)
LDL Chol Calc (NIH): 46 mg/dL (ref 0–99)
Triglycerides: 105 mg/dL (ref 0–149)
VLDL Cholesterol Cal: 19 mg/dL (ref 5–40)

## 2023-03-01 LAB — TSH+FREE T4
Free T4: 1.12 ng/dL (ref 0.82–1.77)
TSH: 3.51 u[IU]/mL (ref 0.450–4.500)

## 2023-03-06 LAB — TOXASSURE SELECT 13 (MW), URINE

## 2023-03-13 ENCOUNTER — Telehealth: Payer: Self-pay

## 2023-03-13 MED ORDER — LORAZEPAM 0.5 MG PO TABS: 0.5000 mg | ORAL_TABLET | Freq: Every day | ORAL | 0 refills | Status: DC

## 2023-03-13 NOTE — Addendum Note (Signed)
Addended by: Christel Mormon E on: 03/13/2023 05:18 PM   Modules accepted: Orders

## 2023-03-13 NOTE — Telephone Encounter (Signed)
Copied from CRM [id number]. Topic: Clinical - Medication Refill >> Mar 13, 2023  2:34 PM Almira Coaster wrote: Most Recent Primary Care Visit:  Provider: Christel Mormon E  Department: [facility] CARE  Visit Type: NEW PATIENT  Date: 02/28/2023  Medication: LORazepam (ATIVAN) 0.5 MG tablet  Has the patient contacted their pharmacy? Yes (Agent: If no, request that the patient contact the pharmacy for the refill. If patient does not wish to contact the pharmacy document the reason why and proceed with request.) (Agent: If yes, when and what did the pharmacy advise?)  Is this the correct pharmacy for this prescription? Yes If no, delete pharmacy and type the correct one.  This is the patient's preferred pharmacy:  [facility] [place], [place] - [id number] [place] [address] Phone: [phone] Fax: [phone]   Has the prescription been filled recently? No  Is the patient out of the medication? No, Patient has enough for the rest of this month.   Has the patient been seen for an appointment in the last year OR does the patient have an upcoming appointment? Yes  Can we respond through MyChart? Yes  Agent: Please be advised that Rx refills may take up to 3 business days. We ask that you follow-up with your pharmacy.

## 2023-04-01 ENCOUNTER — Ambulatory Visit (HOSPITAL_COMMUNITY)
Admission: RE | Admit: 2023-04-01 | Discharge: 2023-04-01 | Disposition: A | Discharge status: Home / Self Care | Payer: PPO | Source: Ambulatory Visit | Attending: Internal Medicine | Admitting: Internal Medicine

## 2023-04-01 DIAGNOSIS — Z1231 Encounter for screening mammogram for malignant neoplasm of breast: Secondary | ICD-10-CM | POA: Insufficient documentation

## 2023-04-03 LAB — FECAL OCCULT BLOOD, IMMUNOCHEMICAL: IFOBT: POSITIVE

## 2023-04-12 ENCOUNTER — Telehealth: Payer: Self-pay | Admitting: Internal Medicine

## 2023-04-12 ENCOUNTER — Telehealth: Payer: Self-pay

## 2023-04-12 NOTE — Telephone Encounter (Signed)
-----   Message from [facility] Heather H sent at 04/12/2023  4:39 PM EST ----- Office received [facility] results of a at home Fecal Occult Blood test, results were positive.  Recommended GI referral

## 2023-04-12 NOTE — Telephone Encounter (Signed)
Copied from CRM [id number]. Topic: General - Call Back - No Documentation >> Apr 12, 2023  1:49 PM Sasha H wrote: Reason for CRM: Angel Belling K. From [employer] was returning a call back from Dr.Dixon's nurse. States nurse has her information already

## 2023-04-12 NOTE — Telephone Encounter (Signed)
Copied from CRM [id number]. Topic: Medical Record Request - Payor/Billing Request >> Apr 12, 2023  9:50 AM Fuller Mandril wrote: Reason for CRM: Payor called to check PCP for paitent and ask if results have been received. Payor is going to be sending results for pt and would like for Nurse for Dr. Durwin Nora to give her a call at [phone] to discuss results once received.

## 2023-04-12 NOTE — Telephone Encounter (Signed)
Left message to return call 

## 2023-04-15 ENCOUNTER — Ambulatory Visit: Payer: PPO | Admitting: Orthopedic Surgery

## 2023-04-15 ENCOUNTER — Other Ambulatory Visit: Payer: Self-pay | Admitting: Internal Medicine

## 2023-04-15 ENCOUNTER — Other Ambulatory Visit (INDEPENDENT_AMBULATORY_CARE_PROVIDER_SITE_OTHER): Payer: Self-pay

## 2023-04-15 ENCOUNTER — Encounter: Payer: Self-pay | Admitting: Orthopedic Surgery

## 2023-04-15 VITALS — BP 142/80 | HR 73 | Ht 64.0 in | Wt 138.0 lb

## 2023-04-15 DIAGNOSIS — Z9889 Other specified postprocedural states: Secondary | ICD-10-CM

## 2023-04-15 DIAGNOSIS — M1711 Unilateral primary osteoarthritis, right knee: Secondary | ICD-10-CM | POA: Diagnosis not present

## 2023-04-15 DIAGNOSIS — M65332 Trigger finger, left middle finger: Secondary | ICD-10-CM

## 2023-04-15 DIAGNOSIS — R195 Other fecal abnormalities: Secondary | ICD-10-CM

## 2023-04-15 MED ORDER — METHYLPREDNISOLONE ACETATE 40 MG/ML IJ SUSP
40.0000 mg | Freq: Once | INTRAMUSCULAR | Status: AC
  Administered 2023-04-15: 40 mg via INTRA_ARTICULAR

## 2023-04-15 NOTE — Progress Notes (Signed)
Chief Complaint  Patient presents with   Knee Pain    Right    finger problem    Trigger middle finger    Encounter Diagnoses  Name Primary?   Primary osteoarthritis of right knee    Trigger finger, left middle finger Yes   S/P right knee arthroscopy 01/30/22      Problem #1 recheck right knee patient is doing well status post right knee arthroscopy  Scheduled for x-rays today has no complaints   Problem #2 triggering of the left long or middle finger  Problem #1 exam shows full range of motion x-rays show joint space narrowing medially compared to last year's x-ray no change in overall joint space  Return as needed    Problem #2 tenderness over the A1 pulley with decreased range of motion in terms of flexing the finger  Injection left long or middle finger for trigger phenomena  Trigger finger injection  Diagnosis left long finger Procedure injection A1 pulley Medications lidocaine 1% 1 mL and Depo-Medrol 40 mg 1 mL Skin prep alcohol and ethyl chloride Verbal consent was obtained Timeout confirmed the injection site  After cleaning the skin with alcohol and anesthetizing the skin with ethyl chloride the A1 pulley was palpated and the injection was performed without complication

## 2023-04-15 NOTE — Addendum Note (Signed)
Addended byCaffie Damme on: 04/15/2023 03:03 PM   Modules accepted: Orders

## 2023-04-16 ENCOUNTER — Telehealth: Payer: Self-pay

## 2023-04-16 NOTE — Telephone Encounter (Signed)
Spoke to patient

## 2023-04-16 NOTE — Telephone Encounter (Signed)
Copied from CRM [id number]. Topic: General - Inquiry >> Apr 15, 2023  4:25 PM Hector Shade B wrote: Reason for CRM: Margerite from [employer] called to speak to office in regards to some labs that were completed by patient stated she had faxed information over an called several times and needed to speak to someone in the office in regards to the contact she's attempted to make.

## 2023-04-22 ENCOUNTER — Encounter: Payer: Self-pay | Admitting: Gastroenterology

## 2023-04-22 ENCOUNTER — Encounter: Payer: Self-pay | Admitting: *Deleted

## 2023-04-22 ENCOUNTER — Ambulatory Visit: Payer: PPO | Admitting: Gastroenterology

## 2023-04-22 VITALS — BP 120/76 | HR 70 | Temp 98.6°F | Ht 63.0 in | Wt 136.2 lb

## 2023-04-22 DIAGNOSIS — R195 Other fecal abnormalities: Secondary | ICD-10-CM | POA: Insufficient documentation

## 2023-04-22 NOTE — Patient Instructions (Signed)
Colonoscopy to be scheduled. 

## 2023-04-22 NOTE — Progress Notes (Signed)
GI Office Note    Referring Provider: Billie Lade, MD Primary Care Physician:  Billie Lade, MD  Primary Gastroenterologist: Hennie Duos. Marletta Lor, DO   Chief Complaint   Chief Complaint  Patient presents with   New Patient (Initial Visit)    Pt referred for POS FIT     History of Present Illness   Angel Rivera is a 72 y.o. female presenting today at the request of Dr. Durwin Nora for positive FIT.   Patient historically has completed yearly hemoccults for colon cancer screening. This is the first one that has been positive. No prior colonoscopy. No FH colon cancer. She has occasional constipation. Generally controls with diet. No UGI symptoms. No unintentional weight loss. BMs regular. No melena, brbpr.   Medications   Current Outpatient Medications  Medication Sig Dispense Refill   ascorbic acid (VITAMIN C) 500 MG tablet Take 500 mg by mouth in the morning.     aspirin EC 81 MG tablet Take 81 mg by mouth in the morning. Swallow whole.     B Complex-C (B-COMPLEX WITH VITAMIN C) tablet Take 1 tablet by mouth in the morning.     Biotin 5000 MCG TABS Take 5,000 mcg by mouth in the morning.     cholecalciferol (VITAMIN D) 25 MCG (1000 UNIT) tablet Take 1,000 Units by mouth in the morning.     LORazepam (ATIVAN) 0.5 MG tablet Take 1 tablet (0.5 mg total) by mouth at bedtime. 30 tablet 0   lovastatin (MEVACOR) 40 MG tablet TAKE 1 TABLET(40 MG) BY MOUTH AT BEDTIME 90 tablet 3   metoprolol succinate (TOPROL-XL) 25 MG 24 hr tablet TAKE 1/2 TABLET(12.5 MG) BY MOUTH DAILY 45 tablet 3   milk thistle 175 MG tablet Take 175 mg by mouth in the morning.     No current facility-administered medications for this visit.    Allergies   Allergies as of 04/22/2023 - Review Complete 04/22/2023  Allergen Reaction Noted   Lipitor [atorvastatin] Other (See Comments) 01/23/2016    Past Medical History   Past Medical History:  Diagnosis Date   Arthritis    Coronary artery disease     NATIVE VESSEL (ICD-414.01)-cath 08/17/09 with subtotally occluded RCA, moderate disease LAD.   Hyperlipidemia    Hypertension    Hypotension    Myocardial infarction (HCC) 08/17/2009   Tobacco user     Past Surgical History   Past Surgical History:  Procedure Laterality Date   ABDOMINAL HYSTERECTOMY  1980s   laparoscopic   CARDIAC CATHETERIZATION N/A 12/30/2015   Procedure: Left Heart Cath and Coronary Angiography;  Surgeon: Kathleene Hazel, MD;  Location: [facility] INVASIVE CV LAB;  Service: Cardiovascular;  Laterality: N/A;   CARDIAC CATHETERIZATION N/A 12/30/2015   Procedure: Coronary Stent Intervention;  Surgeon: Kathleene Hazel, MD;  Location: [facility] INVASIVE CV LAB;  Service: Cardiovascular;  Laterality: N/A;   CORONARY ANGIOPLASTY WITH STENT PLACEMENT  08/17/2009   "1 stent"   KNEE ARTHROSCOPY WITH MEDIAL MENISECTOMY Right 01/30/2022   Procedure: KNEE ARTHROSCOPY WITH MEDIAL MENISECTOMY;  Surgeon: Vickki Hearing, MD;  Location: [facility];  Service: Orthopedics;  Laterality: Right;   TOTAL HIP ARTHROPLASTY Right 06/17/2020   Procedure: RIGHT TOTAL HIP ARTHROPLASTY ANTERIOR APPROACH;  Surgeon: Kathryne Hitch, MD;  Location: [facility];  Service: Orthopedics;  Laterality: Right;   TOTAL HIP ARTHROPLASTY Left 10/21/2020   Procedure: LEFT TOTAL HIP ARTHROPLASTY ANTERIOR APPROACH;  Surgeon: Kathryne Hitch, MD;  Location: [facility];  Service: Orthopedics;  Laterality: Left;   TUBAL LIGATION  ~ 1980    Past Family History   Family History  Problem Relation Age of Onset   Hypertension Mother    Heart attack Mother    Heart disease Father        bypass   Heart disease Brother        bypass   Coronary artery disease Other    Colon cancer Neg Hx     Past Social History   Social History   Socioeconomic History   Marital status: Divorced    Spouse name: Not on file   Number of children: 2   Years of education: Not on file   Highest education level: Not on file   Occupational History   Not on file  Tobacco Use   Smoking status: Former    Current packs/day: 0.00    Average packs/day: 0.5 packs/day for 33.0 years (16.5 ttl pk-yrs)    Types: Cigarettes    Start date: 05/07/1980    Quit date: 05/07/2013    Years since quitting: 9.9   Smokeless tobacco: Never  Vaping Use   Vaping status: Never Used  Substance and Sexual Activity   Alcohol use: Not Currently    Comment: 12/30/2015 "might have a drink once/year"   Drug use: No   Sexual activity: Not on file  Other Topics Concern   Not on file  Social History Narrative   The patient has a almost 1-pack per day tobacco use     history.     She occasionally has alcohol-no abuse.     No illicit drug use   Divorced, 2 children   Social Drivers of Corporate investment banker Strain: Not on file  Food Insecurity: Not on file  Transportation Needs: Not on file  Physical Activity: Not on file  Stress: Not on file  Social Connections: Not on file  Intimate Partner Violence: Not on file    Review of Systems   General: Negative for anorexia, weight loss, fever, chills, fatigue, weakness. Eyes: Negative for vision changes.  ENT: Negative for hoarseness, difficulty swallowing , nasal congestion. CV: Negative for chest pain, angina, palpitations, dyspnea on exertion, peripheral edema.  Respiratory: Negative for dyspnea at rest, dyspnea on exertion, cough, sputum, wheezing.  GI: See history of present illness. GU:  Negative for dysuria, hematuria, urinary incontinence, urinary frequency, nocturnal urination.  MS: Negative for joint pain, low back pain.  Derm: Negative for rash or itching.  Neuro: Negative for weakness, abnormal sensation, seizure, frequent headaches, memory loss,  confusion.  Psych: Negative for anxiety, depression, suicidal ideation, hallucinations.  Endo: Negative for unusual weight change.  Heme: Negative for bruising or bleeding. Allergy: Negative for rash or  hives.  Physical Exam   BP 120/76   Pulse 70   Temp 98.6 F (37 C)   Ht 5\' 3"  (1.6 m)   Wt 136 lb 3.2 oz (61.8 kg)   BMI 24.13 kg/m    General: Well-nourished, well-developed in no acute distress.  Head: Normocephalic, atraumatic.   Eyes: Conjunctiva pink, no icterus. Mouth: Oropharyngeal mucosa moist and pink  Neck: Supple without thyromegaly, masses, or lymphadenopathy.  Lungs: Clear to auscultation bilaterally.  Heart: Regular rate and rhythm, no murmurs rubs or gallops.  Abdomen: Bowel sounds are normal, nontender, nondistended, no hepatosplenomegaly or masses,  no abdominal bruits or hernia, no rebound or guarding.   Rectal: not performed Extremities: No lower extremity edema. No  clubbing or deformities.  Neuro: Alert and oriented x 4 , grossly normal neurologically.  Skin: Warm and dry, no rash or jaundice.   Psych: Alert and cooperative, normal mood and affect.  Labs   Lab Results  Component Value Date   NA 141 02/28/2023   CL 104 02/28/2023   K 4.1 02/28/2023   CO2 22 02/28/2023   BUN 13 02/28/2023   CREATININE 0.99 02/28/2023   EGFR 60 02/28/2023   CALCIUM 9.2 02/28/2023   ALBUMIN 4.2 02/28/2023   GLUCOSE 92 02/28/2023   Lab Results  Component Value Date   ALT 21 02/28/2023   AST 35 02/28/2023   ALKPHOS 97 02/28/2023   BILITOT 0.6 02/28/2023   Lab Results  Component Value Date   WBC 5.6 02/28/2023   HGB 13.3 02/28/2023   HCT 40.8 02/28/2023   MCV 97 02/28/2023   PLT 240 02/28/2023   Lab Results  Component Value Date   HGBA1C 5.5 02/28/2023   Lab Results  Component Value Date   TSH 3.510 02/28/2023   Lab Results  Component Value Date   VITAMINB12 747 02/28/2023   Lab Results  Component Value Date   FOLATE >20.0 02/28/2023    Imaging Studies   DG Knee AP/LAT W/Sunrise Right Result Date: 04/15/2023 X-rays right knee status post right knee arthroscopy history of osteoarthritis X-rays compared to a year ago X-rays show Narrowing of the  medial compartment with subchondral sclerosis 3 to 4 degrees of valgus still remain in the knee despite the medial joint space narrowing Osteoarthritis moderate to right knee   MM 3D SCREENING MAMMOGRAM BILATERAL BREAST Result Date: 04/02/2023 CLINICAL DATA:  Screening. EXAM: DIGITAL SCREENING BILATERAL MAMMOGRAM WITH TOMOSYNTHESIS AND CAD TECHNIQUE: Bilateral screening digital craniocaudal and mediolateral oblique mammograms were obtained. Bilateral screening digital breast tomosynthesis was performed. The images were evaluated with computer-aided detection. COMPARISON:  Previous exam(s). ACR Breast Density Category b: There are scattered areas of fibroglandular density. FINDINGS: There are no findings suspicious for malignancy. IMPRESSION: No mammographic evidence of malignancy. A result letter of this screening mammogram will be mailed directly to the patient. RECOMMENDATION: Screening mammogram in one year. (Code:SM-B-01Y) BI-RADS CATEGORY  1: Negative. Electronically Signed   By: Elberta Fortis M.D.   On: 04/02/2023 12:40    Assessment/Plan:   Heme positive stool: -colonoscopy with Dr. Marletta Lor. ASA 3, ok for room 1/2.  I have discussed the risks, alternatives, benefits with regards to but not limited to the risk of reaction to medication, bleeding, infection, perforation and the patient is agreeable to proceed. Written consent to be obtained.    Leanna Battles. Melvyn Neth, MHS, PA-C [facility]

## 2023-04-24 MED ORDER — CLENPIQ 10-3.5-12 MG-GM -GM/175ML PO SOLN: 1.0000 | ORAL | 0 refills | Status: DC

## 2023-05-13 ENCOUNTER — Other Ambulatory Visit: Payer: Self-pay | Admitting: Internal Medicine

## 2023-05-30 ENCOUNTER — Telehealth: Payer: Self-pay | Admitting: *Deleted

## 2023-05-30 NOTE — Telephone Encounter (Signed)
Spoke with pt and she wants to cancel procedure for 1/28 for now. Procedure cancelled message sent to endo

## 2023-06-04 ENCOUNTER — Encounter (HOSPITAL_COMMUNITY): Admission: RE | Payer: Self-pay | Source: Home / Self Care

## 2023-06-04 ENCOUNTER — Ambulatory Visit (HOSPITAL_COMMUNITY): Admission: RE | Admit: 2023-06-04 | Payer: PPO | Source: Home / Self Care | Admitting: Internal Medicine

## 2023-06-04 SURGERY — COLONOSCOPY WITH PROPOFOL
Anesthesia: Monitor Anesthesia Care

## 2023-06-11 ENCOUNTER — Other Ambulatory Visit: Payer: Self-pay | Admitting: Internal Medicine

## 2023-06-23 NOTE — Progress Notes (Unsigned)
No chief complaint on file.  History of Present Illness: 73 yo female with history of CAD, hyperlipidemia and tobacco abuse here today for cardiac follow up. She was admitted to [facility] April 2011 with a NSTEMI secondary to subtotally occluded distal RCA which was treated with a drug eluting stent. I saw her in August 2017 and she c/o pain in her upper back which was her prior anginal equivalent. Cardiac cath 12/30/15 with severe stenosis mid RCA treated with a drug eluting stent. There was stable moderate disease in the mid LAD and mid Circumflex. She has not tolerated Lipitor but has tolerated Mevacor. She also did not tolerate Coreg but has tolerated low dose Toprol.   She is here today for follow up. The patient denies any chest pain, dyspnea, palpitations, lower extremity edema, orthopnea, PND, dizziness, near syncope or syncope.   Primary Care Physician: Billie Lade, MD  Past Medical History:  Diagnosis Date   Arthritis    Coronary artery disease    NATIVE VESSEL (ICD-414.01)-cath 08/17/09 with subtotally occluded RCA, moderate disease LAD.   Hyperlipidemia    Hypertension    Hypotension    Myocardial infarction (HCC) 08/17/2009   Tobacco user     Past Surgical History:  Procedure Laterality Date   ABDOMINAL HYSTERECTOMY  1980s   laparoscopic   CARDIAC CATHETERIZATION N/A 12/30/2015   Procedure: Left Heart Cath and Coronary Angiography;  Surgeon: Kathleene Hazel, MD;  Location: [facility] INVASIVE CV LAB;  Service: Cardiovascular;  Laterality: N/A;   CARDIAC CATHETERIZATION N/A 12/30/2015   Procedure: Coronary Stent Intervention;  Surgeon: Kathleene Hazel, MD;  Location: [facility] INVASIVE CV LAB;  Service: Cardiovascular;  Laterality: N/A;   CORONARY ANGIOPLASTY WITH STENT PLACEMENT  08/17/2009   "1 stent"   KNEE ARTHROSCOPY WITH MEDIAL MENISECTOMY Right 01/30/2022   Procedure: KNEE ARTHROSCOPY WITH MEDIAL MENISECTOMY;  Surgeon: Vickki Hearing, MD;  Location: [facility];   Service: Orthopedics;  Laterality: Right;   TOTAL HIP ARTHROPLASTY Right 06/17/2020   Procedure: RIGHT TOTAL HIP ARTHROPLASTY ANTERIOR APPROACH;  Surgeon: Kathryne Hitch, MD;  Location: [facility];  Service: Orthopedics;  Laterality: Right;   TOTAL HIP ARTHROPLASTY Left 10/21/2020   Procedure: LEFT TOTAL HIP ARTHROPLASTY ANTERIOR APPROACH;  Surgeon: Kathryne Hitch, MD;  Location: [facility];  Service: Orthopedics;  Laterality: Left;   TUBAL LIGATION  ~ 1980    Current Outpatient Medications  Medication Sig Dispense Refill   ascorbic acid (VITAMIN C) 500 MG tablet Take 500 mg by mouth in the morning.     aspirin EC 81 MG tablet Take 81 mg by mouth in the morning. Swallow whole.     B Complex-C (B-COMPLEX WITH VITAMIN C) tablet Take 1 tablet by mouth in the morning.     Biotin 5000 MCG TABS Take 5,000 mcg by mouth in the morning.     cholecalciferol (VITAMIN D) 25 MCG (1000 UNIT) tablet Take 1,000 Units by mouth in the morning.     LORazepam (ATIVAN) 0.5 MG tablet Take 1 tablet (0.5 mg total) by mouth at bedtime. 30 tablet 0   lovastatin (MEVACOR) 40 MG tablet TAKE 1 TABLET(40 MG) BY MOUTH AT BEDTIME 90 tablet 3   metoprolol succinate (TOPROL-XL) 25 MG 24 hr tablet TAKE 1/2 TABLET(12.5 MG) BY MOUTH DAILY 45 tablet 3   milk thistle 175 MG tablet Take 175 mg by mouth in the morning.     Sod Picosulfate-Mag Ox-Cit Acd (CLENPIQ) 10-3.5-12 MG-GM -GM/175ML SOLN Take 1  kit by mouth as directed. 350 mL 0   No current facility-administered medications for this visit.    Allergies  Allergen Reactions   Lipitor [Atorvastatin] Other (See Comments)    Leg pain    Social History   Socioeconomic History   Marital status: Divorced    Spouse name: Not on file   Number of children: 2   Years of education: Not on file   Highest education level: Not on file  Occupational History   Not on file  Tobacco Use   Smoking status: Former    Current packs/day: 0.00    Average packs/day: 0.5  packs/day for 33.0 years (16.5 ttl pk-yrs)    Types: Cigarettes    Start date: 05/07/1980    Quit date: 05/07/2013    Years since quitting: 10.1   Smokeless tobacco: Never  Vaping Use   Vaping status: Never Used  Substance and Sexual Activity   Alcohol use: Not Currently    Comment: 12/30/2015 "might have a drink once/year"   Drug use: No   Sexual activity: Not on file  Other Topics Concern   Not on file  Social History Narrative   The patient has a almost 1-pack per day tobacco use     history.     She occasionally has alcohol-no abuse.     No illicit drug use   Divorced, 2 children   Social Drivers of Corporate investment banker Strain: Not on file  Food Insecurity: Not on file  Transportation Needs: Not on file  Physical Activity: Not on file  Stress: Not on file  Social Connections: Not on file  Intimate Partner Violence: Not on file    Family History  Problem Relation Age of Onset   Hypertension Mother    Heart attack Mother    Heart disease Father        bypass   Heart disease Brother        bypass   Coronary artery disease Other    Colon cancer Neg Hx     Review of Systems:  As stated in the HPI and otherwise negative.   There were no vitals taken for this visit.  Physical Examination: General: Well developed, well nourished, NAD  HEENT: OP clear, mucus membranes moist  SKIN: warm, dry. No rashes. Neuro: No focal deficits  Musculoskeletal: Muscle strength 5/5 all ext  Psychiatric: Mood and affect normal  Neck: No JVD, no carotid bruits, no thyromegaly, no lymphadenopathy.  Lungs:Clear bilaterally, no wheezes, rhonci, crackles Cardiovascular: Regular rate and rhythm. No murmurs, gallops or rubs. Abdomen:Soft. Bowel sounds present. Non-tender.  Extremities: No lower extremity edema. Pulses are 2 + in the bilateral DP/PT.  EKG:  EKG is *** ordered today. The ekg ordered today demonstrates  Recent Labs: 02/28/2023: ALT 21; BUN 13; Creatinine, Ser 0.99;  Hemoglobin 13.3; Platelets 240; Potassium 4.1; Sodium 141; TSH 3.510   Lipid Panel    Component Value Date/Time   CHOL 120 02/28/2023 1038   TRIG 105 02/28/2023 1038   HDL 55 02/28/2023 1038   CHOLHDL 2.2 02/28/2023 1038   CHOLHDL 2.3 04/03/2016 0919   VLDL 24 04/03/2016 0919   LDLCALC 46 02/28/2023 1038   LDLDIRECT 113.7 10/15/2011 1105     Wt Readings from Last 3 Encounters:  04/22/23 61.8 kg  04/15/23 62.6 kg  02/28/23 62.7 kg    Assessment and Plan:   1. CAD without angina: No chest pain suggestive of angina. Continue ASA, Mevacor and Toprol.  2. Tobacco abuse, in remission: She stopped smoking in February 2015.  3. Hyperlipidemia: LDL at goal in October 2024. Continue statin.   Labs/ tests ordered today include:  No orders of the defined types were placed in this encounter.  Disposition:   F/U with me 12 months.   Signed, Verne Carrow, MD 06/23/2023 5:46 PM    [facility] [address] [place], [place], [place]  [address] Phone: [phone]; Fax: [phone]

## 2023-06-24 ENCOUNTER — Ambulatory Visit: Payer: HMO | Attending: Cardiovascular Disease | Admitting: Cardiovascular Disease

## 2023-06-24 ENCOUNTER — Encounter: Payer: Self-pay | Admitting: Cardiovascular Disease

## 2023-06-24 VITALS — BP 124/76 | HR 68 | Ht 63.0 in | Wt 135.0 lb

## 2023-06-24 DIAGNOSIS — I2510 Atherosclerotic heart disease of native coronary artery without angina pectoris: Secondary | ICD-10-CM | POA: Diagnosis not present

## 2023-06-24 DIAGNOSIS — E7800 Pure hypercholesterolemia, unspecified: Secondary | ICD-10-CM

## 2023-06-24 MED ORDER — FUROSEMIDE 40 MG PO TABS: 40.0000 mg | ORAL_TABLET | Freq: Every day | ORAL | 1 refills | Status: AC | PRN

## 2023-06-24 MED ORDER — LOVASTATIN 40 MG PO TABS: ORAL_TABLET | ORAL | 3 refills | Status: AC

## 2023-06-24 MED ORDER — METOPROLOL SUCCINATE ER 25 MG PO TB24: ORAL_TABLET | ORAL | 1 refills | Status: DC

## 2023-06-24 NOTE — Patient Instructions (Signed)
Medication Instructions:  Your physician has recommended you make the following change in your medication:  1.) start furosemide (Lasix) 40 mg - one tablet daily as needed for swellling  *If you need a refill on your cardiac medications before your next appointment, please call your pharmacy*   Lab Work: none If you have labs (blood work) drawn today and your tests are completely normal, you will receive your results only by: MyChart Message (if you have MyChart) OR A paper copy in the mail If you have any lab test that is abnormal or we need to change your treatment, we will call you to review the results.   Testing/Procedures: none   Follow-Up: At [facility], you and your health needs are our priority.  As part of our continuing mission to provide you with exceptional heart care, we have created designated Provider Care Teams.  These Care Teams include your primary Cardiologist (physician) and Advanced Practice Providers (APPs -  Physician Assistants and Nurse Practitioners) who all work together to provide you with the care you need, when you need it.   Your next appointment:   12 month(s)  Provider:   Verne Carrow, MD

## 2023-07-08 ENCOUNTER — Other Ambulatory Visit: Payer: Self-pay | Admitting: Internal Medicine

## 2023-07-09 ENCOUNTER — Other Ambulatory Visit: Payer: Self-pay | Admitting: Internal Medicine

## 2023-07-10 ENCOUNTER — Ambulatory Visit (INDEPENDENT_AMBULATORY_CARE_PROVIDER_SITE_OTHER): Payer: PPO

## 2023-07-10 VITALS — Ht 63.5 in | Wt 134.0 lb

## 2023-07-10 DIAGNOSIS — Z0000 Encounter for general adult medical examination without abnormal findings: Secondary | ICD-10-CM

## 2023-07-10 DIAGNOSIS — Z780 Asymptomatic menopausal state: Secondary | ICD-10-CM

## 2023-07-10 NOTE — Progress Notes (Signed)
 Please attest and cosign this visit due to patients primary care provider not being in the office at the time the visit was completed.  Because this visit was a virtual/telehealth visit,  certain criteria was not obtained, such a blood pressure, CBG if applicable, and timed get up and go. Any medications not marked as "taking" were not mentioned during the medication reconciliation part of the visit. Any vitals not documented were not able to be obtained due to this being a telehealth visit or patient was unable to self-report a recent blood pressure reading due to a lack of equipment at home via telehealth. Vitals that have been documented are verbally provided by the patient.   Subjective:   Angel Rivera is a 73 y.o. who presents for a Medicare Wellness preventive visit.  Visit Complete: Virtual I connected with  Angel Rivera on 07/10/23 by a audio enabled telemedicine application and verified that I am speaking with the correct person using two identifiers.  Patient Location: Home  Provider Location: Home Office  I discussed the limitations of evaluation and management by telemedicine. The patient expressed understanding and agreed to proceed.  Vital Signs: Because this visit was a virtual/telehealth visit, some criteria may be missing or patient reported. Any vitals not documented were not able to be obtained and vitals that have been documented are patient reported.  VideoDeclined- This patient declined Librarian, academic. Therefore the visit was completed with audio only.  AWV Questionnaire: Yes: Patient Medicare AWV questionnaire was completed by the patient on 07/08/2023; I have confirmed that all information answered by patient is correct and no changes since this date.  Cardiac Risk Factors include: advanced age (>67men, >81 women);dyslipidemia;sedentary lifestyle     Objective:    Today's Vitals   07/10/23 1045  Weight: 134 lb (60.8 kg)  Height:  5' 3.5" (1.613 m)   Body mass index is 23.36 kg/m.     07/10/2023   11:09 AM 01/30/2022    8:56 AM 01/26/2022    2:16 PM 10/21/2020   10:05 AM 10/19/2020    1:18 PM 06/17/2020   11:00 AM 06/14/2020   12:57 PM  Advanced Directives  Does Patient Have a Medical Advance Directive? No No No No No No No  Would patient like information on creating a medical advance directive? No - Patient declined  No - Patient declined No - Patient declined  No - Patient declined     Current Medications (verified) Outpatient Encounter Medications as of 07/10/2023  Medication Sig   ascorbic acid (VITAMIN C) 500 MG tablet Take 500 mg by mouth in the morning.   aspirin EC 81 MG tablet Take 81 mg by mouth in the morning. Swallow whole.   B Complex-C (B-COMPLEX WITH VITAMIN C) tablet Take 1 tablet by mouth in the morning.   Biotin 5000 MCG TABS Take 5,000 mcg by mouth in the morning.   cholecalciferol (VITAMIN D) 25 MCG (1000 UNIT) tablet Take 1,000 Units by mouth in the morning.   furosemide (LASIX) 40 MG tablet Take 1 tablet (40 mg total) by mouth daily as needed.   LORazepam (ATIVAN) 0.5 MG tablet Take 1 tablet (0.5 mg total) by mouth at bedtime.   lovastatin (MEVACOR) 40 MG tablet TAKE 1 TABLET(40 MG) BY MOUTH AT BEDTIME   metoprolol succinate (TOPROL-XL) 25 MG 24 hr tablet TAKE 1/2 TABLET(12.5 MG) BY MOUTH DAILY   milk thistle 175 MG tablet Take 175 mg by mouth in the morning.   [  DISCONTINUED] Sod Picosulfate-Mag Ox-Cit Acd (CLENPIQ) 10-3.5-12 MG-GM -GM/175ML SOLN Take 1 kit by mouth as directed.   No facility-administered encounter medications on file as of 07/10/2023.    Allergies (verified) Lipitor [atorvastatin]   History: Past Medical History:  Diagnosis Date   Arthritis    Coronary artery disease    NATIVE VESSEL (ICD-414.01)-cath 08/17/09 with subtotally occluded RCA, moderate disease LAD.   Hyperlipidemia    Hypertension    Hypotension    Myocardial infarction (HCC) 08/17/2009   Tobacco user     Past Surgical History:  Procedure Laterality Date   ABDOMINAL HYSTERECTOMY  1980s   laparoscopic   CARDIAC CATHETERIZATION N/A 12/30/2015   Procedure: Left Heart Cath and Coronary Angiography;  Surgeon: Kathleene Hazel, MD;  Location: [facility] INVASIVE CV LAB;  Service: Cardiovascular;  Laterality: N/A;   CARDIAC CATHETERIZATION N/A 12/30/2015   Procedure: Coronary Stent Intervention;  Surgeon: Kathleene Hazel, MD;  Location: [facility] INVASIVE CV LAB;  Service: Cardiovascular;  Laterality: N/A;   CORONARY ANGIOPLASTY WITH STENT PLACEMENT  08/17/2009   "1 stent"   KNEE ARTHROSCOPY WITH MEDIAL MENISECTOMY Right 01/30/2022   Procedure: KNEE ARTHROSCOPY WITH MEDIAL MENISECTOMY;  Surgeon: Vickki Hearing, MD;  Location: [facility] ORS;  Service: Orthopedics;  Laterality: Right;   TOTAL HIP ARTHROPLASTY Right 06/17/2020   Procedure: RIGHT TOTAL HIP ARTHROPLASTY ANTERIOR APPROACH;  Surgeon: Kathryne Hitch, MD;  Location: [facility] ORS;  Service: Orthopedics;  Laterality: Right;   TOTAL HIP ARTHROPLASTY Left 10/21/2020   Procedure: LEFT TOTAL HIP ARTHROPLASTY ANTERIOR APPROACH;  Surgeon: Kathryne Hitch, MD;  Location: [facility] ORS;  Service: Orthopedics;  Laterality: Left;   TUBAL LIGATION  ~ 1980   Family History  Problem Relation Age of Onset   Hypertension Mother    Heart attack Mother    Heart disease Father        bypass   Heart disease Brother        bypass   Coronary artery disease Other    Colon cancer Neg Hx    Social History   Socioeconomic History   Marital status: Divorced    Spouse name: Not on file   Number of children: 2   Years of education: Not on file   Highest education level: Not on file  Occupational History   Not on file  Tobacco Use   Smoking status: Former    Current packs/day: 0.00    Average packs/day: 0.5 packs/day for 33.0 years (16.5 ttl pk-yrs)    Types: Cigarettes    Start date: 05/07/1980    Quit date: 05/07/2013    Years since quitting: 10.1    Smokeless tobacco: Never  Vaping Use   Vaping status: Never Used  Substance and Sexual Activity   Alcohol use: Not Currently    Comment: 12/30/2015 "might have a drink once/year"   Drug use: No   Sexual activity: Not on file  Other Topics Concern   Not on file  Social History Narrative   The patient has a almost 1-pack per day tobacco use     history.     She occasionally has alcohol-no abuse.     No illicit drug use   Divorced, 2 children   Social Drivers of Corporate investment banker Strain: Low Risk  (07/10/2023)   Overall Financial Resource Strain (CARDIA)    Difficulty of Paying Living Expenses: Not hard at all  Food Insecurity: No Food Insecurity (07/10/2023)   Hunger Vital Sign  Worried About Programme researcher, broadcasting/film/video in the Last Year: Never true    Ran Out of Food in the Last Year: Never true  Transportation Needs: No Transportation Needs (07/10/2023)   PRAPARE - Administrator, Civil Service (Medical): No    Lack of Transportation (Non-Medical): No  Physical Activity: Inactive (07/10/2023)   Exercise Vital Sign    Days of Exercise per Week: 0 days    Minutes of Exercise per Session: 0 min  Stress: No Stress Concern Present (07/10/2023)   [employer] of Occupational Health - Occupational Stress Questionnaire    Feeling of Stress : Not at all  Social Connections: Unknown (07/10/2023)   Social Connection and Isolation Panel [NHANES]    Frequency of Communication with Friends and Family: More than three times a week    Frequency of Social Gatherings with Friends and Family: More than three times a week    Attends Religious Services: More than 4 times per year    Active Member of [employer] or Organizations: Yes    Attends Engineer, structural: More than 4 times per year    Marital Status: Patient declined   Tobacco Counseling Counseling given: Yes  Clinical Intake:  Pre-visit preparation completed: Yes  Pain : No/denies pain  BMI - recorded:  23.36 Nutritional Status: BMI of 19-24  Normal Nutritional Risks: None Diabetes: No  How often do you need to have someone help you when you read instructions, pamphlets, or other written materials from your doctor or pharmacy?: 1 - Never  Interpreter Needed?: No  Information entered by :: A Parisha Beaulac, CMA   Activities of Daily Living     07/10/2023   11:09 AM  In your present state of health, do you have any difficulty performing the following activities:  Hearing? 0  Vision? 0  Difficulty concentrating or making decisions? 0  Walking or climbing stairs? 0  Dressing or bathing? 0  Doing errands, shopping? 0  Preparing Food and eating ? N  Using the Toilet? N  In the past six months, have you accidently leaked urine? N  Do you have problems with loss of bowel control? N  Managing your Medications? N  Managing your Finances? N  Housekeeping or managing your Housekeeping? N    Patient Care Team: Billie Lade, MD as PCP - General (Internal Medicine) Kathleene Hazel, MD as PCP - Cardiology (Cardiology) Lanelle Bal, DO as Consulting Physician (Gastroenterology) De Blanch as Physician Assistant (Gastroenterology) Vickki Hearing, MD as Consulting Physician (Orthopedic Surgery) Kathryne Hitch, MD as Consulting Physician (Orthopedic Surgery)  Indicate any recent Medical Services you may have received from other than [facility] providers in the past year (date may be approximate).     Assessment:   This is a routine wellness examination for Angel Rivera.  Hearing/Vision screen Hearing Screening - Comments:: Patient denies any hearing difficulties.   Vision Screening - Comments:: Patient is not up to date on yearly eye exams.  Does not have an ophthalmologist and declines referral today    Goals Addressed             This Visit's Progress    Patient Stated       Go to [place] in June 2025 and enjoy myself       Depression Screen      07/10/2023   11:13 AM 02/28/2023    9:28 AM  PHQ 2/9 Scores  PHQ - 2 Score 0 0  PHQ- 9 Score 0 0    Fall Risk     07/10/2023   11:09 AM 02/28/2023    9:28 AM  Fall Risk   Falls in the past year? 0 0  Number falls in past yr: 0   Injury with Fall? 0   Risk for fall due to : No Fall Risks   Follow up Falls prevention discussed;Falls evaluation completed     MEDICARE RISK AT HOME:  Medicare Risk at Home If so, are there any without handrails?: Yes Home free of loose throw rugs in walkways, pet beds, electrical cords, etc?: Yes Adequate lighting in your home to reduce risk of falls?: No Life alert?: No Use of a cane, walker or w/c?: No Grab bars in the bathroom?: Yes Shower chair or bench in shower?: Yes Elevated toilet seat or a handicapped toilet?: Yes  TIMED UP AND GO:  Was the test performed?  No  Cognitive Function: 6CIT completed        07/10/2023   10:59 AM  6CIT Screen  What Year? 0 points  What month? 0 points  What time? 0 points  Count back from 20 0 points  Months in reverse 0 points  Repeat phrase 0 points  Total Score 0 points    Immunizations Immunization History  Administered Date(s) Administered   Fluad Trivalent(High Dose 65+) 02/28/2023   Influenza, High Dose Seasonal PF 03/12/2018, 03/03/2019    Screening Tests Health Maintenance  Topic Date Due   COVID-19 Vaccine (1) Never done   DTaP/Tdap/Td (1 - Tdap) Never done   Zoster Vaccines- Shingrix (1 of 2) Never done   Colonoscopy  Never done   DEXA SCAN  05/18/2023   Pneumonia Vaccine 25+ Years old (1 of 2 - PCV) 02/28/2024 (Originally 01/03/1957)   MAMMOGRAM  03/31/2024   Medicare Annual Wellness (AWV)  07/09/2024   INFLUENZA VACCINE  Completed   Hepatitis C Screening  Completed   HPV VACCINES  Aged Out    Health Maintenance  Health Maintenance Due  Topic Date Due   COVID-19 Vaccine (1) Never done   DTaP/Tdap/Td (1 - Tdap) Never done   Zoster Vaccines- Shingrix (1 of 2) Never done    Colonoscopy  Never done   DEXA SCAN  05/18/2023   Health Maintenance Items Addressed: DEXA ordered  Additional Screening:  Vision Screening: Recommended annual ophthalmology exams for early detection of glaucoma and other disorders of the eye.  Dental Screening: Recommended annual dental exams for proper oral hygiene  Community Resource Referral / Chronic Care Management: CRR required this visit?  No   CCM required this visit?  No     Plan:     I have personally reviewed and noted the following in the patient's chart:   Medical and social history Use of alcohol, tobacco or illicit drugs  Current medications and supplements including opioid prescriptions. Patient is not currently taking opioid prescriptions. Functional ability and status Nutritional status Physical activity Advanced directives List of other physicians Hospitalizations, surgeries, and ER visits in previous 12 months Vitals Screenings to include cognitive, depression, and falls Referrals and appointments  In addition, I have reviewed and discussed with patient certain preventive protocols, quality metrics, and best practice recommendations. A written personalized care plan for preventive services as well as general preventive health recommendations were provided to patient.     Jordan Hawks Vic Esco, CMA   07/10/2023   After Visit Summary: (MyChart) Due to this being a telephonic visit, the after  visit summary with patients personalized plan was offered to patient via MyChart   Notes: Nothing significant to report at this time.

## 2023-07-10 NOTE — Patient Instructions (Signed)
 Angel Rivera , Thank you for taking time to come for your Medicare Wellness Visit. I appreciate your ongoing commitment to your health goals. Please review the following plan we discussed and let me know if I can assist you in the future.   Referrals/Orders/Follow-Ups/Clinician Recommendations:  Next Medicare Annual Wellness Visit:   July 13, 2024 at 3:10 pm video visit.  An order for a bone density scan has been placed for you today. Please call the number below to schedule your appt.   [facility] [address] [place], [place] [address] [phone] Make sure to wear two-piece clothing.  No lotions powders or deodorants the day of the appointment Make sure to bring picture ID and insurance card.  Bring list of medications you are currently taking including any supplements.   This is a list of the screening recommended for you and due dates:  Health Maintenance  Topic Date Due   COVID-19 Vaccine (1) Never done   DTaP/Tdap/Td vaccine (1 - Tdap) Never done   Zoster (Shingles) Vaccine (1 of 2) Never done   Colon Cancer Screening  Never done   DEXA scan (bone density measurement)  05/18/2023   Pneumonia Vaccine (1 of 2 - PCV) 02/28/2024*   Mammogram  03/31/2024   Medicare Annual Wellness Visit  07/09/2024   Flu Shot  Completed   Hepatitis C Screening  Completed   HPV Vaccine  Aged Out  *Topic was postponed. The date shown is not the original due date.    Advanced directives: (Declined) Advance directive discussed with you today. Even though you declined this today, please call our office should you change your mind, and we can give you the proper paperwork for you to fill out.  Next Medicare Annual Wellness Visit scheduled for next year: yes  Understanding Your Risk for Falls Millions of people have serious injuries from falls each year. It is important to understand your risk of falling. Talk with your health care provider about your risk and  what you can do to lower it. If you do have a serious fall, make sure to tell your provider. Falling once raises your risk of falling again. How can falls affect me? Serious injuries from falls are common. These include: Broken bones, such as hip fractures. Head injuries, such as traumatic brain injuries (TBI) or concussions. A fear of falling can cause you to avoid activities and stay at home. This can make your muscles weaker and raise your risk for a fall. What can increase my risk? There are a number of risk factors that increase your risk for falling. The more risk factors you have, the higher your risk of falling. Serious injuries from a fall happen most often to people who are older than 74 years old. Teenagers and young adults ages 62-29 are also at higher risk. Common risk factors include: Weakness in the lower body. Being generally weak or confused due to long-term (chronic) illness. Dizziness or balance problems. Poor vision. Medicines that cause dizziness or drowsiness. These may include: Medicines for your blood pressure, heart, anxiety, insomnia, or swelling (edema). Pain medicines. Muscle relaxants. Other risk factors include: Drinking alcohol. Having had a fall in the past. Having foot pain or wearing improper footwear. Working at a dangerous job. Having any of the following in your home: Tripping hazards, such as floor clutter or loose rugs. Poor lighting. Pets. Having dementia or memory loss. What actions can I take to lower my risk  of falling?     Physical activity Stay physically fit. Do strength and balance exercises. Consider taking a regular class to build strength and balance. Yoga and tai chi are good options. Vision Have your eyes checked every year and your prescription for glasses or contacts updated as needed. Shoes and walking aids Wear non-skid shoes. Wear shoes that have rubber soles and low heels. Do not wear high heels. Do not walk around the  house in socks or slippers. Use a cane or walker as told by your provider. Home safety Attach secure railings on both sides of your stairs. Install grab bars for your bathtub, shower, and toilet. Use a non-skid mat in your bathtub or shower. Attach bath mats securely with double-sided, non-slip rug tape. Use good lighting in all rooms. Keep a flashlight near your bed. Make sure there is a clear path from your bed to the bathroom. Use night-lights. Do not use throw rugs. Make sure all carpeting is taped or tacked down securely. Remove all clutter from walkways and stairways, including extension cords. Repair uneven or broken steps and floors. Avoid walking on icy or slippery surfaces. Walk on the grass instead of on icy or slick sidewalks. Use ice melter to get rid of ice on walkways in the winter. Use a cordless phone. Questions to ask your health care provider Can you help me check my risk for a fall? Do any of my medicines make me more likely to fall? Should I take a vitamin D supplement? What exercises can I do to improve my strength and balance? Should I make an appointment to have my vision checked? Do I need a bone density test to check for weak bones (osteoporosis)? Would it help to use a cane or a walker? Where to find more information Centers for Disease Control and Prevention, STEADI: TonerPromos.no Community-Based Fall Prevention Programs: TonerPromos.no [employer] on Aging: BaseRingTones.pl Contact a health care provider if: You fall at home. You are afraid of falling at home. You feel weak, drowsy, or dizzy. This information is not intended to replace advice given to you by your health care provider. Make sure you discuss any questions you have with your health care provider. Document Revised: 12/25/2021 Document Reviewed: 12/25/2021 Elsevier Patient Education  2024 [employer].

## 2023-07-16 ENCOUNTER — Ambulatory Visit (HOSPITAL_COMMUNITY)
Admission: RE | Admit: 2023-07-16 | Discharge: 2023-07-16 | Disposition: A | Discharge status: Home / Self Care | Source: Ambulatory Visit | Attending: Internal Medicine | Admitting: Internal Medicine

## 2023-07-16 DIAGNOSIS — M85832 Other specified disorders of bone density and structure, left forearm: Secondary | ICD-10-CM | POA: Diagnosis not present

## 2023-07-16 DIAGNOSIS — Z780 Asymptomatic menopausal state: Secondary | ICD-10-CM | POA: Insufficient documentation

## 2023-07-29 ENCOUNTER — Other Ambulatory Visit (INDEPENDENT_AMBULATORY_CARE_PROVIDER_SITE_OTHER): Payer: Self-pay

## 2023-07-29 ENCOUNTER — Telehealth: Payer: Self-pay | Admitting: Orthopedic Surgery

## 2023-07-29 ENCOUNTER — Ambulatory Visit: Admitting: Orthopedic Surgery

## 2023-07-29 VITALS — BP 146/94 | HR 73 | Ht 63.5 in | Wt 132.0 lb

## 2023-07-29 DIAGNOSIS — M25572 Pain in left ankle and joints of left foot: Secondary | ICD-10-CM

## 2023-07-29 NOTE — Patient Instructions (Signed)
 Your insurance does not require approval for the CT please go ahead and call to schedule your appointment with [facility] within at least one (1) week.   Central Scheduling [phone]

## 2023-07-29 NOTE — Progress Notes (Signed)
  Intake history:  BP (!) 146/94   Pulse 73   Ht 5' 3.5" (1.613 m)   Wt 132 lb (59.9 kg)   BMI 23.02 kg/m  Body mass index is 23.02 kg/m.    WHAT ARE WE SEEING YOU FOR TODAY?   left foot/feet  How long has this bothered you? 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  No  Diabetes No  Heart disease Yes  Hypertension No  SMOKING HX No  Kidney disease No  Any ALLERGIES ______________________________________________   Treatment:  Have you taken:  Tylenol No  Advil No  Had PT No  Had injection No  Other  _________________________

## 2023-07-29 NOTE — Progress Notes (Signed)
 Subjective:     Patient ID: Angel Rivera, female   DOB: [birth year], 73 y.o.   MRN: [id number]  Foot Pain This is a new problem. The current episode started in the past 7 days. The problem occurs constantly. The problem has been unchanged. Associated symptoms include numbness. Pertinent negatives include no abdominal pain, anorexia, change in bowel habit, fatigue, fever, headaches, joint swelling or weakness. The symptoms are aggravated by walking. She has tried nothing for the symptoms.   This is a 73 year old female presents with acute onset of left foot pain between the third and fourth metatarsals with some feeling of perhaps tingling in the third and fourth digit  Review of Systems  Constitutional:  Negative for fatigue and fever.  Gastrointestinal:  Negative for abdominal pain, anorexia and change in bowel habit.  Musculoskeletal:  Negative for joint swelling.  Neurological:  Positive for numbness. Negative for weakness and headaches.       Objective:   Physical Exam Vitals and nursing note reviewed.  Constitutional:      Appearance: Normal appearance.  HENT:     Head: Normocephalic and atraumatic.  Eyes:     General: No scleral icterus.       Right eye: No discharge.        Left eye: No discharge.     Extraocular Movements: Extraocular movements intact.     Conjunctiva/sclera: Conjunctivae normal.     Pupils: Pupils are equal, round, and reactive to light.  Cardiovascular:     Rate and Rhythm: Normal rate.     Pulses: Normal pulses.  Musculoskeletal:     Comments: Left foot  Skin is intact.  She is tender between the third and fourth metatarsal.  She is also tender at the interval between the metatarsal heads of 3 and 4  Foot alignment looks normal  No atrophy  Since patient otherwise intact good color capillary refill etc.  Skin:    General: Skin is warm and dry.     Capillary Refill: Capillary refill takes less than 2 seconds.  Neurological:     General: No  focal deficit present.     Mental Status: She is alert and oriented to person, place, and time.     Gait: Gait abnormal.  Psychiatric:        Mood and Affect: Mood normal.        Behavior: Behavior normal.        Thought Content: Thought content normal.        Judgment: Judgment normal.        Assessment:     DG Foot Complete Left Result Date: 07/29/2023 Imaging report left foot pain between the third and fourth metatarsals approximately at the interval of 50% of the length of the bone No evidence of stress fracture.  She does have some increased cortical thickening of #4 with a kissing lesion of increased sclerosis of #3, she has an os trigonum She also has some calcification in the Achilles tendon insertion and a plantar spur.  She has some mild narrowing of the midfoot and forefoot joints on the lateral x-ray We also note cortical thickening of #3 and some of #4 Unclear etiology   DG Bone Density Result Date: 07/16/2023 EXAM: DUAL X-RAY ABSORPTIOMETRY (DXA) FOR BONE MINERAL DENSITY IMPRESSION: Your patient Angel Rivera completed a BMD test on 07/16/2023 using the [employer] DXA System (software version: 14.10) manufactured by [employer]. The following summarizes the results of our evaluation. Technologist:  [facility] PATIENT BIOGRAPHICAL: Name: Angel Rivera, Angel Rivera Patient ID: [id number] Birth Date: [birth year] Height: 63.5 in. Gender: Female Exam Date: 07/16/2023 Weight: 134.0 lbs. Indications: Bilateral Oophrectomy, Caucasian, Height Loss, History of Fracture (Adult), Post Menopausal Fractures: Wrist Treatments: Asprin, Calcium, Vitamin D DENSITOMETRY RESULTS: Site         Region     Measured Date Measured Age WHO Classification Young Adult T-score BMD         %Change vs. Previous Significant Change (*) AP Spine L1-L4 6/16/[age] 73.5 Normal 0.1 1.194 g/cm2 1.0% - AP Spine L1-L4 4/17/2020 71.3 Normal 0.0 1.182 g/cm2 1.2% - AP Spine L1-L4 10/30/2017 67.8 Normal -0.1 1.168 g/cm2 - - Left  Forearm Radius 33% 07/16/2023 73.5 Osteopenia -1.3 0.614 g/cm2 -6.1% - Left Forearm Radius 33% 05/17/2021 71.3 Normal -0.7 0.654 g/cm2 - - ASSESSMENT: The BMD measured at Forearm Radius 33% is 0.614 g/cm2 with a T-score of -1.3. This patient's diagnostic category is LOW BONE MASS/OSTEOPENIA according to World Health Organization [facility]) criteria. The scan quality is good. Compared with the prior study on 05/17/21, the BMD of the ap spine and lt. forearm show no statistically significant change. Lumbar spine was excluded due to advanced degenerative changes. Patient is not a candidate for FRAX assessment due to excluding both hips due to surgical repair. World Science writer [facility]) criteria for post-menopausal, Caucasian Women: Normal:       T-score at or above -1 SD Osteopenia:   T-score between -1 and -2.5 SD Osteoporosis: T-score at or below -2.5 SD RECOMMENDATIONS: 1. All patients should optimize calcium and vitamin D intake. 2. Consider FDA-approved medical therapies in postmenopausal women and med aged 62 years and older, based on the following: a. A hip or vertebral (clinical or morphometric) fracture b. T-score< -2.5 at the femoral neck or spine after appropriate evaluation to exclude secondary causes c. Low bone mass (T-score between -1.0 and -2.5 at the femoral neck or spine) and a 10-year probability of a hip fracture > 3% or a 10-year probability of a major osteoporosis-related fracture > 20% based on the [place]-adapted WHO algorithm d. Clinician judgment and/or patient preferences may indicate treatment for people with 10-year fracture probabilities above or below these levels FOLLOW-UP: Patients with diagnosis of osteoporosis or at high risk for fracture should have regular bone mineral density tests. For patients eligible for Medicare, routine testing is allowed once every 2 years. The testing frequency can be increased to one year for patients who have rapidly progressing disease, those who are receiving  or discontinuing medical therapy to restore bone mass, or have additional risk factors. I have reviewed this report, and agree with the above findings. [facility] Electronically Signed   By: Harmon Pier M.D.   On: 07/16/2023 12:55       Plan:     Recommend carbon fiber insert  Reevaluate for possible neuroma  CT scan of the foot to evaluate the bone abnormality  Return after CT scan

## 2023-07-29 NOTE — Telephone Encounter (Signed)
 DR. Romeo Apple,   Patient called states she has scheduled her CT.  I gave her 08/19/23 or 08/22/23 to come back in and see you.    She is NOT happy with those dates she said she was unaware of the long out to get it read and to come back in to see you .    She is in pain ..... On a scales of 1 to 10 she is 7   She is upset. She would like someone from the office to call her back about her issue.

## 2023-07-29 NOTE — Telephone Encounter (Signed)
 DR. Romeo Apple   Patient called to advise [place] of her CT appt.  I advised her we can schedule her for 08/19/23 or 08/22/23

## 2023-07-30 ENCOUNTER — Encounter: Payer: Self-pay | Admitting: Orthopedic Surgery

## 2023-07-31 ENCOUNTER — Ambulatory Visit (HOSPITAL_COMMUNITY): Admission: RE | Admit: 2023-07-31 | Source: Ambulatory Visit

## 2023-07-31 ENCOUNTER — Encounter (HOSPITAL_COMMUNITY): Payer: Self-pay

## 2023-08-01 ENCOUNTER — Ambulatory Visit (HOSPITAL_COMMUNITY)
Admission: RE | Admit: 2023-08-01 | Discharge: 2023-08-01 | Disposition: A | Discharge status: Home / Self Care | Source: Ambulatory Visit | Attending: Orthopedic Surgery | Admitting: Orthopedic Surgery

## 2023-08-01 DIAGNOSIS — M25572 Pain in left ankle and joints of left foot: Secondary | ICD-10-CM | POA: Diagnosis not present

## 2023-08-01 DIAGNOSIS — M79672 Pain in left foot: Secondary | ICD-10-CM | POA: Diagnosis not present

## 2023-08-01 DIAGNOSIS — M19072 Primary osteoarthritis, left ankle and foot: Secondary | ICD-10-CM | POA: Diagnosis not present

## 2023-08-02 ENCOUNTER — Ambulatory Visit (HOSPITAL_COMMUNITY)

## 2023-08-05 ENCOUNTER — Encounter: Payer: Self-pay | Admitting: Orthopedic Surgery

## 2023-08-12 ENCOUNTER — Encounter: Payer: Self-pay | Admitting: Orthopedic Surgery

## 2023-08-12 ENCOUNTER — Ambulatory Visit: Admitting: Orthopedic Surgery

## 2023-08-12 DIAGNOSIS — G5762 Lesion of plantar nerve, left lower limb: Secondary | ICD-10-CM | POA: Diagnosis not present

## 2023-08-12 MED ORDER — METHYLPREDNISOLONE ACETATE 40 MG/ML IJ SUSP
40.0000 mg | Freq: Once | INTRAMUSCULAR | Status: AC
  Administered 2023-08-12: 40 mg via INTRA_ARTICULAR

## 2023-08-12 NOTE — Progress Notes (Signed)
    There were no vitals taken for this visit.  There is no height or weight on file to calculate BMI.  Chief Complaint  Patient presents with   Foot Pain    Left / has ? About inserts both or just for the left   Results    Review CT    No diagnosis found.  DOI/DOS/ Date: ongoing  Unchanged

## 2023-08-12 NOTE — Progress Notes (Signed)
 Chief Complaint  Patient presents with   Foot Pain    Left / has ? About inserts both or just for the left   Results    Review CT    Angel Rivera continues to have pain up with numbness and tingling like feeling left foot 3rd and 4th intermetatarsal region with an associated fullness on the bottom of the foot  She did have the CT scan and I will render an opinion on the images: The lesion in the proximal aspect of the 3rd and 4th metatarsal is a chronic lesion seen on x-ray in 2022 which has gotten a little bit more progressive.  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 This was well tolerated.

## 2023-08-14 ENCOUNTER — Telehealth: Payer: Self-pay | Admitting: Orthopedic Surgery

## 2023-08-14 NOTE — Telephone Encounter (Signed)
 Pt advised and scheduled.

## 2023-08-14 NOTE — Telephone Encounter (Signed)
 Dr. Mort Sawyers pt - spoke w/the pt, she is in excruciating pain with her lt big toe.  She stated the pain started yesterday and waking her up throbbing.  Can not put any pressure on that foot.  She stated it's swollen, pressure and tight.  Please advise.  [phone]

## 2023-08-15 ENCOUNTER — Ambulatory Visit: Admitting: Orthopedic Surgery

## 2023-08-20 ENCOUNTER — Other Ambulatory Visit: Payer: Self-pay | Admitting: Internal Medicine

## 2023-09-16 ENCOUNTER — Other Ambulatory Visit: Payer: Self-pay | Admitting: Internal Medicine

## 2023-09-24 ENCOUNTER — Telehealth: Payer: Self-pay | Admitting: Internal Medicine

## 2023-09-24 NOTE — Telephone Encounter (Signed)
 Copied from CRM [id number]. Topic: General - Call Back - No Documentation >> Sep 24, 2023 12:57 PM Stanly Early wrote: Reason for CRM: patient has a few question about who will be her new provider [phone]

## 2023-09-24 NOTE — Telephone Encounter (Signed)
 Spoke with pt- answered all current questions she will speak with her insurance and call us  back

## 2023-10-09 ENCOUNTER — Ambulatory Visit

## 2023-10-15 ENCOUNTER — Ambulatory Visit

## 2023-10-15 VITALS — BP 155/83 | HR 78 | Ht 60.0 in | Wt 135.1 lb

## 2023-10-15 DIAGNOSIS — F411 Generalized anxiety disorder: Secondary | ICD-10-CM | POA: Diagnosis not present

## 2023-10-15 DIAGNOSIS — F430 Acute stress reaction: Secondary | ICD-10-CM

## 2023-10-15 DIAGNOSIS — F5104 Psychophysiologic insomnia: Secondary | ICD-10-CM | POA: Diagnosis not present

## 2023-10-15 MED ORDER — LORAZEPAM 0.5 MG PO TABS: 0.5000 mg | ORAL_TABLET | Freq: Two times a day (BID) | ORAL | 0 refills | Status: DC

## 2023-10-15 NOTE — Progress Notes (Unsigned)
 Established Patient Office Visit  Subjective   Patient ID: Angel Rivera, female    DOB: 01/13/1951  Age: 72 y.o. MRN: [id number]  Chief Complaint  Patient presents with   Medical Management of Chronic Issues    Pt states here for follow up    HPI Anxiety, Follow-up  She was last seen for anxiety 8 months ago. Changes made at last visit include none.   She reports good compliance with treatment. She reports good tolerance of treatment. She is not having side effects.   She feels her anxiety is moderate and Worse since last visit. Her 74 y/o special needs grandson has just been placed on hospice, which has her very upset.    Symptoms: No chest pain Yes difficulty concentrating  No dizziness No fatigue  No feelings of losing control Yes insomnia  No irritable No palpitations  Yes panic attacks No racing thoughts  No shortness of breath No sweating  No tremors/shakes    GAD-7 Results    10/15/2023    1:32 PM 02/28/2023    9:28 AM  GAD-7 Generalized Anxiety Disorder Screening Tool  1. Feeling Nervous, Anxious, or on Edge 0 0  2. Not Being Able to Stop or Control Worrying 1 0  3. Worrying Too Much About Different Things 1 0  4. Trouble Relaxing 1 0  5. Being So Restless it's Hard To Sit Still 1 0  6. Becoming Easily Annoyed or Irritable 1 0  7. Feeling Afraid As If Something Awful Might Happen 1 0  Total GAD-7 Score 6 0  Difficulty At Work, Home, or Getting  Along With Others? Not difficult at all Not difficult at all    PHQ-9 Scores    10/15/2023    1:31 PM 07/10/2023   11:13 AM 02/28/2023    9:28 AM  PHQ9 SCORE ONLY  PHQ-9 Total Score 4 0 0    --------------------------------------------------------------------------------------------------- Patient Active Problem List   Diagnosis Date Noted   Anxiety in acute stress reaction 10/16/2023   Heme positive stool 04/22/2023   Insomnia 02/28/2023   Need for influenza vaccination 02/28/2023   S/P right knee  arthroscopy 01/30/22  02/07/2022   Tear of medial meniscus of right knee, current    Primary osteoarthritis of right knee    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   Hyperlipidemia    Hypertension    Hypotension    Myocardial infarction (HCC) 08/17/2009   Tobacco user       ROS    Objective:     BP (!) 155/83   Pulse 78   Ht 5' (1.524 m)   Wt 135 lb 1.3 oz (61.3 kg)   SpO2 98%   BMI 26.38 kg/m  BP Readings from Last 3 Encounters:  10/15/23 (!) 155/83  07/29/23 (!) 146/94  06/24/23 124/76   Wt Readings from Last 3 Encounters:  10/15/23 135 lb 1.3 oz (61.3 kg)  07/29/23  132 lb (59.9 kg)  07/10/23 134 lb (60.8 kg)     Physical Exam Vitals and nursing note reviewed.  Constitutional:      Appearance: Normal appearance.  Eyes:     Extraocular Movements: Extraocular movements intact.     Pupils: Pupils are equal, round, and reactive to light.  Cardiovascular:     Rate and Rhythm: Normal rate and regular rhythm.  Pulmonary:     Effort: Pulmonary effort is normal.     Breath sounds: Normal breath sounds.  Musculoskeletal:     Cervical back: Normal range of motion and neck supple.  Neurological:     Mental Status: She is alert and oriented to person, place, and time.  Psychiatric:        Attention and Perception: Attention normal.        Mood and Affect: Mood is anxious. Affect is  tearful.        Behavior: Behavior normal.        Thought Content: Thought content normal.        Cognition and Memory: Cognition normal.      No results found for any visits on 10/15/23.  Last CBC Lab Results  Component Value Date   WBC 5.6 02/28/2023   HGB 13.3 02/28/2023   HCT 40.8 02/28/2023   MCV 97 02/28/2023   MCH 31.7 02/28/2023   RDW 12.1 02/28/2023   PLT 240 02/28/2023   Last metabolic panel Lab Results  Component Value Date   GLUCOSE 92 02/28/2023   NA 141 02/28/2023   K 4.1 02/28/2023   CL 104 02/28/2023   CO2 22 02/28/2023   BUN 13 02/28/2023   CREATININE 0.99 02/28/2023   EGFR 60 02/28/2023   CALCIUM  9.2 02/28/2023   PROT 6.7 02/28/2023   ALBUMIN 4.2 02/28/2023   LABGLOB 2.5 02/28/2023   AGRATIO 1.4 03/24/2019   BILITOT 0.6 02/28/2023   ALKPHOS 97 02/28/2023   AST 35 02/28/2023   ALT 21 02/28/2023   ANIONGAP 9 01/26/2022   Last lipids Lab Results  Component Value Date   CHOL 120 02/28/2023   HDL 55 02/28/2023   LDLCALC 46 02/28/2023   LDLDIRECT 113.7 10/15/2011   TRIG 105 02/28/2023   CHOLHDL 2.2 02/28/2023   Last hemoglobin A1c Lab Results  Component Value Date   HGBA1C 5.5 02/28/2023   Last thyroid  functions Lab Results  Component Value Date   TSH 3.510 02/28/2023   Last vitamin B12 and Folate Lab Results  Component Value Date   VITAMINB12 747 02/28/2023   FOLATE >20.0 02/28/2023      The ASCVD Risk score (Arnett DK, et al., 2019) failed to calculate for the following reasons:   Risk score cannot be calculated because patient has a medical history suggesting prior/existing ASCVD    Assessment & Plan:   Problem List Items Addressed This Visit       Other   Insomnia - Primary   She endorses a history of insomnia.  Currently prescribed lorazepam  0.5 mg nightly.  This has been managed by her PCP.  PDMP reviewed and is appropriate.       Relevant Medications   LORazepam  (ATIVAN ) 0.5 MG tablet   Anxiety in acute stress  reaction   She has had increased anxiety due to her 73 year old grandson being placed on hospice recently.  Agreed to increase her lorazepam  to 2 times a day dosing if needed for anxiety.  She already takes nightly for insomnia.  PDMP reviewed and is  appropriate.      Relevant Medications   LORazepam  (ATIVAN ) 0.5 MG tablet    No follow-ups on file.    Alison Irvine, FNP

## 2023-10-16 DIAGNOSIS — F411 Generalized anxiety disorder: Secondary | ICD-10-CM | POA: Insufficient documentation

## 2023-10-16 NOTE — Assessment & Plan Note (Signed)
 She endorses a history of insomnia.  Currently prescribed lorazepam  0.5 mg nightly.  This has been managed by her PCP.  PDMP reviewed and is appropriate.

## 2023-10-16 NOTE — Assessment & Plan Note (Signed)
 She has had increased anxiety due to her 74 year old grandson being placed on hospice recently.  Agreed to increase her lorazepam  to 2 times a day dosing if needed for anxiety.  She already takes nightly for insomnia.  PDMP reviewed and is appropriate.

## 2023-11-05 ENCOUNTER — Ambulatory Visit
Admission: EM | Admit: 2023-11-05 | Discharge: 2023-11-05 | Disposition: A | Discharge status: Home / Self Care | Source: Ambulatory Visit | Attending: Nurse Practitioner | Admitting: Nurse Practitioner

## 2023-11-05 ENCOUNTER — Encounter: Payer: Self-pay | Admitting: Emergency Medicine

## 2023-11-05 DIAGNOSIS — L049 Acute lymphadenitis, unspecified: Secondary | ICD-10-CM | POA: Insufficient documentation

## 2023-11-05 LAB — POCT RAPID STREP A (OFFICE): Rapid Strep A Screen: NEGATIVE

## 2023-11-05 NOTE — Discharge Instructions (Addendum)
 The rapid strep test was negative.  A throat culture has been ordered.  You will be contacted if the pending test result is abnormal. Continue over-the-counter analgesics such as Tylenol  to help with pain or discomfort. Recommend warm salt water  gargles 3-4 times daily as needed for pain or discomfort. Follow-up in this clinic or with your primary care physician within the next 7 to 10 days if symptoms fail to improve, or if they appear to be worsening. Follow-up as needed.

## 2023-11-05 NOTE — ED Provider Notes (Signed)
 [facility]    CSN: [id number] Arrival date & time: 11/05/23  9187      History   Chief Complaint No chief complaint on file.   HPI Angel Rivera is a 73 y.o. female.   The history is provided by the patient.   Patient presents for complaints of sore lymph nodes that has been present for the past 2 days.  States that she was exposed to air conditioning for a prolonged period of time.  Since that time, symptoms started.  She denies fever, chills, headache, ear pain, sore throat, nasal congestion, runny nose, or cough.  States that she has taken over-the-counter medications for pain.  Past Medical History:  Diagnosis Date   Arthritis    Coronary artery disease    NATIVE VESSEL (ICD-414.01)-cath 08/17/09 with subtotally occluded RCA, moderate disease LAD.   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   Unilateral primary osteoarthritis, 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   Unilateral primary osteoarthritis, right hip 03/23/2020   Fracture, [place], left, closed 09/06/17 09/23/2017   Encounter for HCV screening test for high risk patient 05/16/2017   MYOCARDIAL INFARCTION, 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   CAD (coronary artery disease) 09/09/2009   HYPOTENSION 09/09/2009    Past Surgical History:  Procedure Laterality Date   ABDOMINAL HYSTERECTOMY  1980s   laparoscopic   CARDIAC CATHETERIZATION N/A 12/30/2015   Procedure: Left Heart Cath and Coronary Angiography;  Surgeon: Lonni JONETTA Cash, MD;  Location: [facility] INVASIVE CV LAB;  Service: Cardiovascular;  Laterality: N/A;   CARDIAC CATHETERIZATION N/A 12/30/2015   Procedure: Coronary Stent Intervention;  Surgeon: Lonni JONETTA Cash, MD;  Location: [facility] INVASIVE CV LAB;  Service: Cardiovascular;  Laterality: N/A;   CORONARY ANGIOPLASTY WITH STENT PLACEMENT  08/17/2009   1 stent   KNEE ARTHROSCOPY WITH MEDIAL MENISECTOMY Right 01/30/2022   Procedure: KNEE ARTHROSCOPY WITH MEDIAL MENISECTOMY;  Surgeon: Margrette Taft BRAVO, MD;  Location: [facility];  Service: Orthopedics;  Laterality: Right;   TOTAL HIP ARTHROPLASTY Right 06/17/2020   Procedure: RIGHT TOTAL HIP ARTHROPLASTY ANTERIOR APPROACH;  Surgeon: Vernetta Lonni GRADE, MD;  Location: [facility];  Service: Orthopedics;  Laterality: Right;   TOTAL HIP ARTHROPLASTY Left 10/21/2020   Procedure: LEFT TOTAL HIP ARTHROPLASTY ANTERIOR APPROACH;  Surgeon: Vernetta Lonni GRADE, MD;  Location: [facility];  Service: Orthopedics;  Laterality: Left;   TUBAL LIGATION  ~ 1980    OB History   No obstetric history on file.      Home Medications    Prior to Admission medications   Medication Sig Start Date End Date Taking? Authorizing Provider  ascorbic acid  (VITAMIN C) 500 MG tablet Take 500 mg by mouth in the morning.    [provider]  aspirin  EC 81 MG tablet Take 81 mg by mouth in the morning. Swallow whole.    [provider]  B Complex-C (B-COMPLEX WITH VITAMIN C) tablet Take 1 tablet by mouth  in the morning.    [provider]  Biotin  5000 MCG TABS Take 5,000 mcg by mouth in the morning.    [provider]  cholecalciferol  (VITAMIN D ) 25 MCG (1000 UNIT) tablet Take 1,000 Units by mouth in the morning.    [provider]  furosemide  (LASIX ) 40 MG tablet Take 1 tablet (40 mg total) by mouth daily as needed. 06/24/23   Verlin Lonni BIRCH, MD  LORazepam  (ATIVAN ) 0.5 MG tablet Take 1 tablet (0.5 mg total) by mouth 2 (two) times daily.  10/15/23   Bevely Doffing, FNP  lovastatin  (MEVACOR ) 40 MG tablet TAKE 1 TABLET(40 MG) BY MOUTH AT BEDTIME 06/24/23   Verlin Lonni BIRCH, MD  metoprolol  succinate (TOPROL -XL) 25 MG 24 hr tablet TAKE 1/2 TABLET(12.5 MG) BY MOUTH DAILY 06/24/23   Verlin Lonni BIRCH, MD  milk thistle 175 MG tablet Take 175 mg by mouth in the morning.    [provider]    Family History Family History  Problem Relation Age of Onset   Hypertension Mother    Heart attack Mother    Heart disease Father        bypass   Heart disease Brother        bypass   Coronary artery disease Other    Colon cancer Neg Hx     Social History Social History   Tobacco Use   Smoking status: Former    Current packs/day: 0.00    Average packs/day: 0.5 packs/day for 33.0 years (16.5 ttl pk-yrs)    Types: Cigarettes    Start date: 05/07/1980    Quit date: 05/07/2013    Years since quitting: 10.5   Smokeless tobacco: Never  Vaping Use   Vaping status: Never Used  Substance Use Topics   Alcohol use: Not Currently    Comment: 12/30/2015 might have a drink once/year   Drug use: No     Allergies   Lipitor [atorvastatin ]   Review of Systems Review of Systems Per HPI  Physical Exam Triage Vital Signs ED Triage Vitals  Encounter Vitals Group     BP 11/05/23 0858 115/74     Girls Systolic BP Percentile --      Girls Diastolic BP Percentile --      Boys Systolic BP Percentile --      Boys Diastolic BP Percentile --      Pulse Rate 11/05/23 0858 75     Resp 11/05/23 0858 18     Temp 11/05/23 0858 97.9 F (36.6 C)     Temp Source 11/05/23 0858 Oral     SpO2 11/05/23 0858 96 %     Weight --      Height --      Head Circumference --      Peak Flow --      Pain Score 11/05/23 0859 8     Pain Loc --      Pain Education --      Exclude from Growth Chart --    No data found.  Updated Vital Signs BP 115/74 (BP Location: Right Arm)   Pulse 75   Temp 97.9 F (36.6 C) (Oral)   Resp 18   SpO2  96%   Visual Acuity Right Eye Distance:   Left Eye Distance:   Bilateral Distance:    Right Eye Near:   Left Eye Near:    Bilateral Near:     Physical Exam Vitals and nursing note reviewed.  Constitutional:  General: She is not in acute distress.    Appearance: Normal appearance.  HENT:     Head: Normocephalic.     Right Ear: Tympanic membrane, ear canal and external ear normal.     Left Ear: Tympanic membrane, ear canal and external ear normal.     Nose: Nose normal.     Mouth/Throat:     Mouth: Mucous membranes are moist.     Pharynx: No posterior oropharyngeal erythema.   Eyes:     Extraocular Movements: Extraocular movements intact.     Conjunctiva/sclera: Conjunctivae normal.     Pupils: Pupils are equal, round, and reactive to light.    Cardiovascular:     Rate and Rhythm: Normal rate and regular rhythm.     Pulses: Normal pulses.     Heart sounds: Normal heart sounds.  Pulmonary:     Effort: Pulmonary effort is normal. No respiratory distress.     Breath sounds: Normal breath sounds. No stridor. No wheezing, rhonchi or rales.  Abdominal:     Palpations: Abdomen is soft.     Tenderness: There is no abdominal tenderness.   Musculoskeletal:     Cervical back: Normal range of motion.  Lymphadenopathy:     Head:     Left side of head: Tonsillar (tenderness present, no swelling) adenopathy present. Submandibular adenopathy: tenderness present, no swelling.  Skin:    General: Skin is warm and dry.   Neurological:     General: No focal deficit present.     Mental Status: She is alert and oriented to person, place, and time.   Psychiatric:        Mood and Affect: Mood normal.        Behavior: Behavior normal.      UC Treatments / Results  Labs (all labs ordered are listed, but only abnormal results are displayed) Labs Reviewed  POCT RAPID STREP A (OFFICE)    EKG   Radiology No results found.  Procedures Procedures (including critical care  time)  Medications Ordered in UC Medications - No data to display  Initial Impression / Assessment and Plan / UC Course  I have reviewed the triage vital signs and the nursing notes.  Pertinent labs & imaging results that were available during my care of the patient were reviewed by me and considered in my medical decision making (see chart for details).  The rapid strep test was negative, throat culture is pending.  Patient with tenderness to the left tonsillar lymph node.  There is no obvious swelling present.  Supportive care recommendations were provided and discussed with the patient to include over-the-counter analgesics, warm salt water  gargles, and warm compresses to the affected area.  Discussed indications with patient regarding follow-up.  Patient states that her doctor normally provides amoxicillin .  Education was provided to the patient that there is no indication that symptoms are of bacterial etiology, would like for patient to attempt conservative treatment at this time.  Patient verbalized understanding.  All questions were answered.  Patient stable for discharge.   Final Clinical Impressions(s) / UC Diagnoses   Final diagnoses:  None   Discharge Instructions   None    ED Prescriptions   None    PDMP not reviewed this encounter.   Gilmer Etta PARAS, NP 11/05/23 1155

## 2023-11-05 NOTE — ED Triage Notes (Signed)
 Swollen lymph node on left side of neck x 2 days.  Had a tooth pulled on that side of face 2 months ago.

## 2023-11-08 ENCOUNTER — Ambulatory Visit (HOSPITAL_COMMUNITY): Payer: Self-pay

## 2023-11-08 LAB — CULTURE, GROUP A STREP (THRC)

## 2023-11-12 ENCOUNTER — Ambulatory Visit: Admitting: Nurse Practitioner

## 2023-11-13 ENCOUNTER — Ambulatory Visit: Admitting: Family Medicine

## 2023-11-13 ENCOUNTER — Encounter: Payer: Self-pay | Admitting: Family Medicine

## 2023-11-13 VITALS — BP 113/70 | HR 86 | Ht 63.0 in | Wt 133.0 lb

## 2023-11-13 DIAGNOSIS — R590 Localized enlarged lymph nodes: Secondary | ICD-10-CM

## 2023-11-13 MED ORDER — PREDNISONE 20 MG PO TABS: 20.0000 mg | ORAL_TABLET | Freq: Two times a day (BID) | ORAL | 0 refills | Status: AC

## 2023-11-13 NOTE — Progress Notes (Signed)
 Established Patient Office Visit   Subjective  Patient ID: Angel Rivera, female    DOB: April 29, 1951  Age: 73 y.o. MRN: [id number]  Chief Complaint  Patient presents with   swollen glands    Swollen glands, for over a week, has already seen urgent care    She  has a past medical history of Arthritis, Coronary artery disease, Hyperlipidemia, Hypertension, Hypotension, Myocardial infarction (HCC) (08/17/2009), and Tobacco user.  The patient presents with a two-week history of swelling on the left side of the neck, which has gradually worsened. She describes the swelling as painful, radiating from the area under her left jaw to her left ear. The mass has remained the same in size and is constantly present. She reports that the area is firm, tender to touch, and fixed in place. Additional symptoms include left ear pain, but she denies sore throat, cough, runny nose, fever, night sweats, weight loss, fatigue, body aches, or any recent skin or dental infections. She has not been sick recently, has had no known exposure to illness, and has not traveled or received any recent vaccinations. She denies any history of autoimmune disease or cancer.    Review of Systems  Constitutional:  Positive for malaise/fatigue. Negative for chills and fever.  Respiratory:  Negative for shortness of breath.   Cardiovascular:  Negative for chest pain.  Neurological:  Negative for dizziness and headaches.      Objective:     BP 113/70   Pulse 86   Ht 5' 3 (1.6 m)   Wt 133 lb (60.3 kg)   SpO2 97%   BMI 23.56 kg/m  BP Readings from Last 3 Encounters:  11/13/23 113/70  11/05/23 115/74  10/15/23 (!) 155/83      Physical Exam Vitals reviewed.  Constitutional:      General: She is not in acute distress.    Appearance: Normal appearance. She is not ill-appearing, toxic-appearing or diaphoretic.  HENT:     Head: Normocephalic.  Eyes:     General:        Right eye: No discharge.        Left eye: No  discharge.     Conjunctiva/sclera: Conjunctivae normal.  Cardiovascular:     Rate and Rhythm: Normal rate.     Pulses: Normal pulses.     Heart sounds: Normal heart sounds.  Pulmonary:     Effort: Pulmonary effort is normal. No respiratory distress.     Breath sounds: Normal breath sounds.  Musculoskeletal:     Cervical back: Normal range of motion. Tenderness present.  Lymphadenopathy:     Cervical: Cervical adenopathy present.     Right cervical: Superficial cervical adenopathy present.     Left cervical: Superficial cervical adenopathy present.  Skin:    General: Skin is warm and dry.  Neurological:     Mental Status: She is alert.     Coordination: Coordination normal.     Gait: Gait normal.  Psychiatric:        Mood and Affect: Mood normal.        Behavior: Behavior normal.      No results found for any visits on 11/13/23.  The ASCVD Risk score (Arnett DK, et al., 2019) failed to calculate for the following reasons:   Risk score cannot be calculated because patient has a medical history suggesting prior/existing ASCVD    Assessment & Plan:  Cervical lymphadenopathy Assessment & Plan: Unknown Etiogly US  neck ordered for further evaluation  Trial on Prednisone  20 mg twice daily x 5 days for pain management   Orders: -     US  SOFT TISSUE HEAD & NECK (NON-THYROID ); Future -     predniSONE ; Take 1 tablet (20 mg total) by mouth 2 (two) times daily with a meal for 5 days.  Dispense: 10 tablet; Refill: 0    Return if symptoms worsen or fail to improve.   Hilario Kidd Wilhelmena Falter, FNP

## 2023-11-13 NOTE — Assessment & Plan Note (Signed)
 Unknown Etiogly US  neck ordered for further evaluation  Trial on Prednisone  20 mg twice daily x 5 days for pain management

## 2023-11-13 NOTE — Patient Instructions (Signed)

## 2023-11-19 ENCOUNTER — Ambulatory Visit (HOSPITAL_COMMUNITY)

## 2023-11-26 ENCOUNTER — Ambulatory Visit (HOSPITAL_COMMUNITY)
Admission: RE | Admit: 2023-11-26 | Discharge: 2023-11-26 | Disposition: A | Discharge status: Home / Self Care | Source: Ambulatory Visit | Attending: Family Medicine | Admitting: Family Medicine

## 2023-11-26 DIAGNOSIS — R599 Enlarged lymph nodes, unspecified: Secondary | ICD-10-CM | POA: Diagnosis not present

## 2023-11-26 DIAGNOSIS — R590 Localized enlarged lymph nodes: Secondary | ICD-10-CM | POA: Diagnosis not present

## 2023-12-04 ENCOUNTER — Ambulatory Visit: Payer: Self-pay | Admitting: Family Medicine

## 2023-12-04 ENCOUNTER — Other Ambulatory Visit: Payer: Self-pay

## 2023-12-04 DIAGNOSIS — F411 Generalized anxiety disorder: Secondary | ICD-10-CM

## 2023-12-04 DIAGNOSIS — F5104 Psychophysiologic insomnia: Secondary | ICD-10-CM

## 2023-12-04 MED ORDER — LORAZEPAM 0.5 MG PO TABS: 0.5000 mg | ORAL_TABLET | Freq: Two times a day (BID) | ORAL | 0 refills | Status: DC

## 2023-12-04 NOTE — Telephone Encounter (Signed)
 Copied from CRM [id number]. Topic: Clinical - Medication Refill >> Dec 04, 2023  3:12 PM Turkey B wrote: Medication: LORazepam  (ATIVAN ) 0.5 MG tablet  Has the patient contacted their pharmacy? yes (Agent: If yes, when and what did the pharmacy advise?)contact pcp, refill order doesn't show in system, so requesting now  This is the patient's preferred pharmacy:  [facility] [place], [place] - [id number] [place] [address] Phone: [phone] Fax: [phone]  Is this the correct pharmacy for this prescription? yes    Has the prescription been filled recently? no  Is the patient out of the medication? yes  Has the patient been seen for an appointment in the last year OR does the patient have an upcoming appointment? yes  Can we respond through MyChart? yes  Agent: Please be advised that Rx refills may take up to 3 business days. We ask that you follow-up with your pharmacy.

## 2023-12-11 ENCOUNTER — Other Ambulatory Visit: Payer: Self-pay

## 2023-12-22 ENCOUNTER — Ambulatory Visit
Admission: EM | Admit: 2023-12-22 | Discharge: 2023-12-22 | Disposition: A | Discharge status: Home / Self Care | Attending: Physician Assistant | Admitting: Physician Assistant

## 2023-12-22 DIAGNOSIS — S90464A Insect bite (nonvenomous), right lesser toe(s), initial encounter: Secondary | ICD-10-CM

## 2023-12-22 DIAGNOSIS — W57XXXA Bitten or stung by nonvenomous insect and other nonvenomous arthropods, initial encounter: Secondary | ICD-10-CM | POA: Diagnosis not present

## 2023-12-22 DIAGNOSIS — L03115 Cellulitis of right lower limb: Secondary | ICD-10-CM

## 2023-12-22 MED ORDER — DOXYCYCLINE HYCLATE 100 MG PO CAPS: 100.0000 mg | ORAL_CAPSULE | Freq: Two times a day (BID) | ORAL | 0 refills | Status: DC

## 2023-12-22 NOTE — ED Provider Notes (Signed)
 [facility]    CSN: [id number] Arrival date & time: 12/22/23  9176      History   Chief Complaint Chief Complaint  Patient presents with   Insect Bite    HPI Angel Rivera is a 73 y.o. female.   Patient presents today with a 24-hour history of redness, pruritus, pain of her right 4th and 5th toe after removing a tick 2 days ago.  She reports that she was at her daughter's house because her grandson passed away and started feeling itching and irritation between 4th and 5th toe.  Her daughter looked and saw a tick and was able to successfully remove this.  It was not significantly engorged and so they cleaned the area but did not apply any topical medications.  Since then she has had increasing erythema and pain with associated pruritus.  She denies any recent antibiotics.  Denies history of recurrent skin infections or MRSA.  She denies any additional symptoms including fever, nausea, vomiting.  She reports that the pain is rated 7 on a 0-10 pain scale, described as a soreness, worse with palpation or attempted ambulation, no alleviating factors identified.    Past Medical History:  Diagnosis Date   Arthritis    Coronary artery disease    NATIVE VESSEL (ICD-414.01)-cath 08/17/09 with subtotally occluded RCA, moderate disease LAD.   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   Unilateral primary osteoarthritis, 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    Unilateral primary osteoarthritis, right hip 03/23/2020   Fracture, [place], left, closed 09/06/17 09/23/2017   Encounter for HCV screening test for high risk patient 05/16/2017   MYOCARDIAL INFARCTION, 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   CAD (coronary artery disease) 09/09/2009   HYPOTENSION 09/09/2009    Past Surgical History:  Procedure Laterality Date   ABDOMINAL HYSTERECTOMY  1980s   laparoscopic   CARDIAC CATHETERIZATION N/A 12/30/2015   Procedure: Left Heart Cath and Coronary Angiography;  Surgeon: Lonni JONETTA Cash, MD;  Location: [facility] INVASIVE CV LAB;  Service: Cardiovascular;  Laterality: N/A;   CARDIAC CATHETERIZATION N/A 12/30/2015   Procedure: Coronary Stent Intervention;  Surgeon: Lonni JONETTA Cash, MD;  Location: [facility] INVASIVE CV LAB;  Service: Cardiovascular;  Laterality: N/A;   CORONARY ANGIOPLASTY WITH STENT PLACEMENT  08/17/2009   1 stent   KNEE ARTHROSCOPY WITH MEDIAL MENISECTOMY Right 01/30/2022   Procedure: KNEE ARTHROSCOPY WITH MEDIAL MENISECTOMY;  Surgeon: Margrette Taft BRAVO, MD;  Location: [facility];  Service: Orthopedics;  Laterality: Right;   TOTAL HIP ARTHROPLASTY Right 06/17/2020   Procedure: RIGHT TOTAL HIP ARTHROPLASTY ANTERIOR APPROACH;  Surgeon: Vernetta Lonni GRADE, MD;  Location: [facility];  Service: Orthopedics;  Laterality: Right;   TOTAL HIP ARTHROPLASTY Left 10/21/2020   Procedure: LEFT TOTAL HIP ARTHROPLASTY ANTERIOR APPROACH;  Surgeon: Vernetta Lonni GRADE, MD;  Location: [facility];  Service: Orthopedics;  Laterality: Left;   TUBAL LIGATION  ~ 1980  OB History   No obstetric history on file.      Home Medications    Prior to Admission medications   Medication Sig Start Date End Date Taking? Authorizing Provider  ascorbic acid  (VITAMIN C) 500 MG tablet Take 500 mg by mouth in the morning.   Yes [provider]  aspirin  EC 81 MG tablet Take 81 mg by mouth in the morning. Swallow whole.   Yes [provider]  B Complex-C  (B-COMPLEX WITH VITAMIN C) tablet Take 1 tablet by mouth in the morning.   Yes [provider]  Biotin  5000 MCG TABS Take 5,000 mcg by mouth in the morning.   Yes [provider]  cholecalciferol  (VITAMIN D ) 25 MCG (1000 UNIT) tablet Take 1,000 Units by mouth in the morning.   Yes [provider]  doxycycline  (VIBRAMYCIN ) 100 MG capsule Take 1 capsule (100 mg total) by mouth 2 (two) times daily. 12/22/23  Yes Candia Kingsbury K, PA-C  furosemide  (LASIX ) 40 MG tablet Take 1 tablet (40 mg total) by mouth daily as needed. 06/24/23  Yes Verlin Lonni BIRCH, MD  LORazepam  (ATIVAN ) 0.5 MG tablet Take 1 tablet (0.5 mg total) by mouth 2 (two) times daily. 12/04/23  Yes Bevely Doffing, FNP  metoprolol  succinate (TOPROL -XL) 25 MG 24 hr tablet TAKE 1/2 TABLET(12.5 MG) BY MOUTH DAILY 06/24/23  Yes Verlin Lonni BIRCH, MD  milk thistle 175 MG tablet Take 175 mg by mouth in the morning.   Yes [provider]  lovastatin  (MEVACOR ) 40 MG tablet TAKE 1 TABLET(40 MG) BY MOUTH AT BEDTIME 06/24/23   Verlin Lonni BIRCH, MD    Family History Family History  Problem Relation Age of Onset   Hypertension Mother    Heart attack Mother    Heart disease Father        bypass   Heart disease Brother        bypass   Coronary artery disease Other    Colon cancer Neg Hx     Social History Social History   Tobacco Use   Smoking status: Former    Current packs/day: 0.00    Average packs/day: 0.5 packs/day for 33.0 years (16.5 ttl pk-yrs)    Types: Cigarettes    Start date: 05/07/1980    Quit date: 05/07/2013    Years since quitting: 10.6   Smokeless tobacco: Never  Vaping Use   Vaping status: Never Used  Substance Use Topics   Alcohol use: Not Currently    Comment: 12/30/2015 might have a drink once/year   Drug use: No     Allergies   Lipitor [atorvastatin ]   Review of Systems Review of Systems  Constitutional:  Positive for activity change. Negative for appetite  change, fatigue and fever.  Respiratory:  Negative for shortness of breath.   Cardiovascular:  Negative for chest pain.  Gastrointestinal:  Negative for nausea and vomiting.  Musculoskeletal:  Positive for arthralgias and gait problem. Negative for myalgias.  Skin:  Positive for color change and wound.  Neurological:  Negative for weakness and numbness.     Physical Exam Triage Vital Signs ED Triage Vitals  Encounter Vitals Group     BP 12/22/23 0840 (!) 141/78     Girls Systolic BP Percentile --      Girls Diastolic BP Percentile --      Boys Systolic BP Percentile --      Boys Diastolic BP Percentile --      Pulse Rate 12/22/23 0840  76     Resp 12/22/23 0840 19     Temp 12/22/23 0840 97.6 F (36.4 C)     Temp Source 12/22/23 0840 Oral     SpO2 12/22/23 0840 98 %     Weight --      Height --      Head Circumference --      Peak Flow --      Pain Score 12/22/23 0838 7     Pain Loc --      Pain Education --      Exclude from Growth Chart --    No data found.  Updated Vital Signs BP (!) 141/78 (BP Location: Right Arm)   Pulse 76   Temp 97.6 F (36.4 C) (Oral)   Resp 19   SpO2 98%   Visual Acuity Right Eye Distance:   Left Eye Distance:   Bilateral Distance:    Right Eye Near:   Left Eye Near:    Bilateral Near:     Physical Exam Vitals reviewed.  Constitutional:      General: She is awake. She is not in acute distress.    Appearance: Normal appearance. She is well-developed. She is not ill-appearing.     Comments: Very pleasant female appears stated age in no acute distress sitting comfortably in exam room  HENT:     Head: Normocephalic and atraumatic.  Cardiovascular:     Rate and Rhythm: Normal rate and regular rhythm.     Heart sounds: Normal heart sounds, S1 normal and S2 normal. No murmur heard.    Comments: Capillary refill within 2 seconds right toes Pulmonary:     Effort: Pulmonary effort is normal.     Breath sounds: Normal breath sounds. No  wheezing, rhonchi or rales.     Comments: Clear to auscultation bilaterally Feet:     Right foot:     Protective Sensation: 10 sites tested.  10 sites sensed.     Skin integrity: Erythema and warmth present. No ulcer.     Toenail Condition: Right toenails are normal.     Comments: Right foot: Erythema of 4th and 5th toe with spread into dorsal foot.  No streaking or evidence of lymphangitis.  Small wound noted interdigital space between 4th and 5th toe with surrounding erythema.  No bleeding or drainage noted.  Foot is neurovascularly intact. Psychiatric:        Behavior: Behavior is cooperative.      UC Treatments / Results  Labs (all labs ordered are listed, but only abnormal results are displayed) Labs Reviewed - No data to display  EKG   Radiology No results found.  Procedures Procedures (including critical care time)  Medications Ordered in UC Medications - No data to display  Initial Impression / Assessment and Plan / UC Course  I have reviewed the triage vital signs and the nursing notes.  Pertinent labs & imaging results that were available during my care of the patient were reviewed by me and considered in my medical decision making (see chart for details).     Patient is well-appearing, afebrile, nontoxic, nontachycardic.  Concern for secondary infection given spreading erythema with associated pain.  Will cover with doxycycline  as this will cover for both cellulitis as well as tickborne illness.  We discussed that she should avoid prolonged sun exposure while on this medication due to associated photosensitivity.  She is to keep the area clean.  Given associated pruritus we discussed that there is the  potential for an allergic component and recommend that she use topical hydrocortisone cream over the area that is itchy as well as over-the-counter antihistamines such as low-dose cetirizine (5 mg).  No indication for dose adjustment based on metabolic panel from  02/28/2023 with a creatinine of 0.99 and calculated creatinine clearance of 48.2 mL/min we discussed that if she has any spread of erythema, increasing pain, fever, nausea, vomiting she needs to be seen immediately.  Strict return precautions given.  Recommend close follow-up with her primary care.  Final Clinical Impressions(s) / UC Diagnoses   Final diagnoses:  Cellulitis of right foot  Bug bite with infection, initial encounter  Tick bite of lesser toe of right foot, initial encounter     Discharge Instructions      We are treating you for an infection.  Start doxycycline  100 mg twice daily for 10 days.  Stay out of the sun while on this medication as it can cause you to have a bad sunburn.  The itching makes me think there might be an allergic component.  Keep the area clean and you can apply a small amount of hydrocortisone cream.  You can take low-dose cetirizine/Zyrtec (5 mg at night) to help with the itching.  If your symptoms are not improving quickly with the medication regimen follow-up with your primary care.  If anything worsens and you have spread of redness, pain, fever, nausea, vomiting you need to be seen immediately.     ED Prescriptions     Medication Sig Dispense Auth. Provider   doxycycline  (VIBRAMYCIN ) 100 MG capsule Take 1 capsule (100 mg total) by mouth 2 (two) times daily. 20 capsule Terryn Redner K, PA-C      PDMP not reviewed this encounter.   Sherrell Rocky POUR, PA-C 12/22/23 9074

## 2023-12-22 NOTE — ED Triage Notes (Signed)
 Patient states that he daughter pulled a tick off of her right foot January 13, 2024. The tick was in between her 4th and 5th toe on the right foot. Patient states that the area is swollen and re. ( Patient is upset in room, grandson died 01/13/2024)

## 2023-12-22 NOTE — Discharge Instructions (Addendum)
 We are treating you for an infection.  Start doxycycline  100 mg twice daily for 10 days.  Stay out of the sun while on this medication as it can cause you to have a bad sunburn.  The itching makes me think there might be an allergic component.  Keep the area clean and you can apply a small amount of hydrocortisone cream.  You can take low-dose cetirizine/Zyrtec (5 mg at night) to help with the itching.  If your symptoms are not improving quickly with the medication regimen follow-up with your primary care.  If anything worsens and you have spread of redness, pain, fever, nausea, vomiting you need to be seen immediately.

## 2024-01-13 ENCOUNTER — Ambulatory Visit: Admitting: Orthopedic Surgery

## 2024-01-13 DIAGNOSIS — M65332 Trigger finger, left middle finger: Secondary | ICD-10-CM

## 2024-01-13 DIAGNOSIS — Z01818 Encounter for other preprocedural examination: Secondary | ICD-10-CM

## 2024-01-13 NOTE — Progress Notes (Signed)
 There were no vitals taken for this visit.  There is no height or weight on file to calculate BMI.  Chief Complaint  Patient presents with   Hand Pain    Left long trigger finger    Encounter Diagnosis  Name Primary?   Trigger finger, left middle finger Yes    DOI/DOS/ Date: Symptoms started in February 2024 2 injections previously  Unchanged- it got a little better with injection but ready to do the surgery   Chief Complaint  Patient presents with   Hand Pain    Left long trigger finger   73 year old female status post 2 injections for left middle finger trigger phenomena.  She would like to proceed with surgery now that the symptoms have come back and she has lost range of motion in her left long finger  Review of systems recent death of her grandson who fought a long battle with respiratory issues related to medical problems  Otherwise normal  Past Medical History:  Diagnosis Date   Arthritis    Coronary artery disease    NATIVE VESSEL (ICD-414.01)-cath 08/17/09 with subtotally occluded RCA, moderate disease LAD.   Hyperlipidemia    Hypertension    Hypotension    Myocardial infarction (HCC) 08/17/2009   Tobacco user    Past Surgical History:  Procedure Laterality Date   ABDOMINAL HYSTERECTOMY  1980s   laparoscopic   CARDIAC CATHETERIZATION N/A 12/30/2015   Procedure: Left Heart Cath and Coronary Angiography;  Surgeon: Lonni JONETTA Cash, MD;  Location: [facility] INVASIVE CV LAB;  Service: Cardiovascular;  Laterality: N/A;   CARDIAC CATHETERIZATION N/A 12/30/2015   Procedure: Coronary Stent Intervention;  Surgeon: Lonni JONETTA Cash, MD;  Location: [facility] INVASIVE CV LAB;  Service: Cardiovascular;  Laterality: N/A;   CORONARY ANGIOPLASTY WITH STENT PLACEMENT  08/17/2009   1 stent   KNEE ARTHROSCOPY WITH MEDIAL MENISECTOMY Right 01/30/2022   Procedure: KNEE ARTHROSCOPY WITH MEDIAL MENISECTOMY;  Surgeon: Margrette Taft BRAVO, MD;  Location: [facility];  Service:  Orthopedics;  Laterality: Right;   TOTAL HIP ARTHROPLASTY Right 06/17/2020   Procedure: RIGHT TOTAL HIP ARTHROPLASTY ANTERIOR APPROACH;  Surgeon: Vernetta Lonni GRADE, MD;  Location: [facility];  Service: Orthopedics;  Laterality: Right;   TOTAL HIP ARTHROPLASTY Left 10/21/2020   Procedure: LEFT TOTAL HIP ARTHROPLASTY ANTERIOR APPROACH;  Surgeon: Vernetta Lonni GRADE, MD;  Location: [facility];  Service: Orthopedics;  Laterality: Left;   TUBAL LIGATION  ~ 1980   Family History  Problem Relation Age of Onset   Hypertension Mother    Heart attack Mother    Heart disease Father        bypass   Heart disease Brother        bypass   Coronary artery disease Other    Colon cancer Neg Hx    Social History   Tobacco Use   Smoking status: Former    Current packs/day: 0.00    Average packs/day: 0.5 packs/day for 33.0 years (16.5 ttl pk-yrs)    Types: Cigarettes    Start date: 05/07/1980    Quit date: 05/07/2013    Years since quitting: 10.6   Smokeless tobacco: Never  Vaping Use   Vaping status: Never Used  Substance Use Topics   Alcohol use: Not Currently    Comment: 12/30/2015 might have a drink once/year   Drug use: No   Current Outpatient Medications  Medication Instructions   ascorbic acid  (VITAMIN C) 500 mg, Every morning   aspirin  EC 81 mg, Every  morning   B Complex-C (B-COMPLEX WITH VITAMIN C) tablet 1 tablet, Every morning   Biotin  5,000 mcg, Every morning   cholecalciferol  (VITAMIN D3) 1,000 Units, Every morning   doxycycline  (VIBRAMYCIN ) 100 mg, Oral, 2 times daily   furosemide  (LASIX ) 40 mg, Oral, Daily PRN   LORazepam  (ATIVAN ) 0.5 mg, Oral, 2 times daily   lovastatin  (MEVACOR ) 40 MG tablet TAKE 1 TABLET(40 MG) BY MOUTH AT BEDTIME   metoprolol  succinate (TOPROL -XL) 25 MG 24 hr tablet TAKE 1/2 TABLET(12.5 MG) BY MOUTH DAILY   milk thistle 175 mg, Every morning   Allergies  Allergen Reactions   Lipitor [Atorvastatin ] Other (See Comments)    Leg pain   Physical  Exam Vitals and nursing note reviewed.  Constitutional:      Appearance: Normal appearance.  HENT:     Head: Normocephalic and atraumatic.  Eyes:     General: No scleral icterus.       Right eye: No discharge.        Left eye: No discharge.     Extraocular Movements: Extraocular movements intact.     Conjunctiva/sclera: Conjunctivae normal.     Pupils: Pupils are equal, round, and reactive to light.  Cardiovascular:     Rate and Rhythm: Normal rate.     Pulses: Normal pulses.  Skin:    General: Skin is warm and dry.     Capillary Refill: Capillary refill takes less than 2 seconds.  Neurological:     General: No focal deficit present.     Mental Status: She is alert and oriented to person, place, and time.  Psychiatric:        Mood and Affect: Mood normal.        Behavior: Behavior normal.        Thought Content: Thought content normal.        Judgment: Judgment normal.    Left long or middle finger tenderness over the A1 pulley decreased flexion Normal extension Color normal temperature normal vascular normal capillary refill normal Tendon function intact  Diagnosis  Encounter Diagnosis  Name Primary?   Trigger finger, left middle finger Yes    The procedure has been fully reviewed with the patient; The risks and benefits of surgery have been discussed and explained and understood. Alternative treatment has also been reviewed, questions were encouraged and answered. The postoperative plan is also been reviewed.  Last injection 9 months ago  Planned procedure trigger finger release left long/middle finger

## 2024-01-13 NOTE — Patient Instructions (Signed)
 Your surgery will be at [facility] by Dr Margrette  The hospital will contact you with a preoperative appointment to discuss Anesthesia.  Please arrive on time or 15 minutes early for the preoperative appointment, they have a very tight schedule if you are late or do not come in your surgery will be cancelled.  The phone number is [phone]. Please bring your medications with you for the appointment. They will tell you the arrival time and medication instructions when you have your preoperative evaluation. Do not wear nail polish the day of your surgery and if you take Phentermine you need to stop this medication ONE WEEK prior to your surgery. If you take Invokana, Farxiga, Jardiance, or Steglatro) - Hold 72 hours before the procedure.  If you take Ozempic,  Mounjaro, Bydureon or Trulicity do not take for 8 days before your surgery. If you take Victoza, Rybelsis, Saxenda or Adlyxi stop 24 hours before the procedure.  Please arrive at the hospital 2 hours before procedure if scheduled at 9:30 or later in the day or at the time the nurse tells you at your preoperative visit.   If you have my chart do not use the time given in my chart use the time given to you by the nurse during your preoperative visit.   Your surgery  time may change. Please be available for phone calls the day of your surgery and the day before. The Short Stay department may need to discuss changes about your surgery time. Not reaching the you could lead to procedure delays and possible cancellation.  You must have a ride home and someone to stay with you for 24 to 48 hours. The person taking you home will receive and sign for the your discharge instructions.  Please be prepared to give your support person's name and telephone number to Central Registration. Dr Margrette will need that name and phone number post procedure.

## 2024-01-13 NOTE — Progress Notes (Signed)
   There were no vitals taken for this visit.  There is no height or weight on file to calculate BMI.  Chief Complaint  Patient presents with   Hand Pain    Left Marajade Lei trigger finger    Encounter Diagnosis  Name Primary?   Trigger finger, left middle finger Yes    DOI/DOS/ Date: Symptoms started in February 2024 2 injections previously  Unchanged- it got a little better with injection but ready to do the surgery

## 2024-01-15 NOTE — Patient Instructions (Signed)
 Your procedure is scheduled on: 01/21/2024  Report to [facility] Main Entrance at   8:30  AM.  Call this number if you have problems the morning of surgery: [phone]   Remember:   Do not Eat or Drink after midnight         No Smoking the morning of surgery  :  Take these medicines the morning of surgery with A SIP OF WATER :  Metoprolol  Lorazepam /Ativan  if needed  Do not wear jewelry, make-up or nail polish.  Do not wear lotions, powders, or perfumes. You may wear deodorant.  Do not shave 48 hours prior to surgery. Men may shave face and neck.  Do not bring valuables to the hospital.  Contacts, dentures or bridgework may not be worn into surgery.  Leave suitcase in the car. After surgery it may be brought to your room.  For patients admitted to the hospital, checkout time is 11:00 AM the day of discharge.   Patients discharged the day of surgery will not be allowed to drive home.    Special Instructions: Shower using CHG night before surgery and shower the day of surgery use CHG.  Use special wash - you have one bottle of CHG for all showers.  You should use approximately 1/2 of the bottle for each shower. How to Use Chlorhexidine  at Home in the Shower Chlorhexidine  gluconate (CHG) is a germ-killing (antiseptic) wash that's used to clean the skin. It can get rid of the germs that normally live on the skin and can keep them away for about 24 hours. If you're having surgery, you may be told to shower with CHG at home the night before surgery. This can help lower your risk for infection. To use CHG wash in the shower, follow the steps below. Supplies needed: CHG body wash. Clean washcloth. Clean towel. How to use CHG in the shower Follow these steps unless you're told to use CHG in a different way: Start the shower. Use your normal soap and shampoo to wash your face and hair. Turn off the shower or move out of the shower stream. Pour CHG onto a clean washcloth. Do not use any  type of brush or rough sponge. Start at your neck, washing your body down to your toes. Make sure you: Wash the part of your body where the surgery will be done for at least 1 minute. Do not scrub. Do not use CHG on your head or face unless your health care provider tells you to. If it gets into your ears or eyes, rinse them well with water . Do not wash your genitals with CHG. Wash your back and under your arms. Make sure to wash skin folds. Let the CHG sit on your skin for 1-2 minutes or as long as told. Rinse your entire body in the shower, including all body creases and folds. Turn off the shower. Dry off with a clean towel. Do not put anything on your skin afterward, such as powder, lotion, or perfume. Put on clean clothes or pajamas. If it's the night before surgery, sleep in clean sheets. General tips Use CHG only as told, and follow the instructions on the label. Use the full amount of CHG as told. This is often one bottle. Do not smoke and stay away from flames after using CHG. Your skin may feel sticky after using CHG. This is normal. The sticky feeling will go away as the CHG dries. Do not use CHG: If you have a chlorhexidine  allergy  or have reacted to chlorhexidine  in the past. On open wounds or areas of skin that have broken skin, cuts, or scrapes. On babies younger than 24 months of age. Contact a health care provider if: You have questions about using CHG. Your skin gets irritated or itchy. You have a rash after using CHG. You swallow any CHG. Call your local poison control center [phone] in the [place] Your eyes itch badly, or they become very red or swollen. Your hearing changes. You have trouble seeing. If you can't reach your provider, go to an urgent care or emergency room. Do not drive yourself. Get help right away if: You have swelling or tingling in your mouth or throat. You make high-pitched whistling sounds when you breathe, most often when you breathe out  (wheeze). You have trouble breathing. These symptoms may be an emergency. Call 911 right away. Do not wait to see if the symptoms will go away. Do not drive yourself to the hospital. This information is not intended to replace advice given to you by your health care provider. Make sure you discuss any questions you have with your health care provider. Document Revised: 11/06/2022 Document Reviewed: 11/02/2021 Elsevier Patient Education  2024 [employer] Inc. Trigger Finger Release, Care After After trigger finger release, it is common to have: Stiffness. Soreness. Swelling. Follow these instructions at home: Incision care  Keep the compression bandage on for 48 hours or as told by your health care provider. After removing it, follow instructions about how to take care of your incision. Make sure you: Wash your hands with soap and water  for at least 20 seconds before and after you change your bandage (dressing). If soap and water  are not available, use hand sanitizer. Change your dressing as told by your provider. Leave stitches (sutures), skin glue, or tape strips in place. These skin closures may need to stay in place for 2 weeks or longer. If tape strip edges start to loosen and curl up, you may trim the loose edges. Do not remove tape strips completely unless your provider tells you to do that. Keep your hand and dressing clean and dry. Check your incision area every day for signs of infection. Check for: More redness, swelling, or pain. Fluid or blood. Warmth. Pus or a bad smell. Bathing Do not take baths, swim, or use a hot tub until your provider approves. Keep your dressing dry until your provider says it can be removed. Cover it with a watertight covering when you take a bath or a shower. Managing pain, stiffness, and swelling  If told, put ice on your palm. Put ice in a plastic bag. Place a towel between your skin and the bag. Leave the ice on for 20 minutes, 2-3 times a  day. If your skin turns bright red, remove the ice right away to prevent skin damage. The risk of damage is higher if you cannot feel pain, heat, or cold. Move your fingers often to reduce stiffness and swelling. Raise (elevate) your hand above the level of your heart while you are sitting or lying down. Activity If you were given a sedative during the procedure, it can affect you for several hours. Do not drive or operate machinery until your provider says that it is safe. You may have to avoid lifting. Ask your provider how much you can safely lift. Avoid any activity that causes pain. It may take 4-6 months for stiffness to go away. Return to your normal activities as told by your  provider. Ask your provider what activities are safe for you. If hand therapy was prescribed, do exercises as told. This will help you regain movement. General instructions Take over-the-counter and prescription medicines only as told by your provider. Do not use any products that contain nicotine or tobacco. These products include cigarettes, chewing tobacco, and vaping devices, such as e-cigarettes. If you need help quitting, ask your provider. Keep all follow-up visits. If you have sutures, these will be removed in about 10-14 days. Your health care provider may give you more instructions. Make sure you know what you can and cannot do. Contact a health care provider if: You have chills or fever. You have any signs of infection. You are unable to move your finger because of pain or stiffness. You have any tingling or numbness in your hand or fingers. This information is not intended to replace advice given to you by your health care provider. Make sure you discuss any questions you have with your health care provider. Document Revised: 12/05/2021 Document Reviewed: 12/05/2021 Elsevier Patient Education  2024 Elsevier Inc. General Anesthesia, Adult, Care After The following information offers guidance on how to  care for yourself after your procedure. Your health care provider may also give you more specific instructions. If you have problems or questions, contact your health care provider. What can I expect after the procedure? After the procedure, it is common for people to: Have pain or discomfort at the IV site. Have nausea or vomiting. Have a sore throat or hoarseness. Have trouble concentrating. Feel cold or chills. Feel weak, sleepy, or tired (fatigue). Have soreness and body aches. These can affect parts of the body that were not involved in surgery. Follow these instructions at home: For the time period you were told by your health care provider:  Rest. Do not participate in activities where you could fall or become injured. Do not drive or use machinery. Do not drink alcohol. Do not take sleeping pills or medicines that cause drowsiness. Do not make important decisions or sign legal documents. Do not take care of children on your own. General instructions Drink enough fluid to keep your urine pale yellow. If you have sleep apnea, surgery and certain medicines can increase your risk for breathing problems. Follow instructions from your health care provider about wearing your sleep device: Anytime you are sleeping, including during daytime naps. While taking prescription pain medicines, sleeping medicines, or medicines that make you drowsy. Return to your normal activities as told by your health care provider. Ask your health care provider what activities are safe for you. Take over-the-counter and prescription medicines only as told by your health care provider. Do not use any products that contain nicotine or tobacco. These products include cigarettes, chewing tobacco, and vaping devices, such as e-cigarettes. These can delay incision healing after surgery. If you need help quitting, ask your health care provider. Contact a health care provider if: You have nausea or vomiting that does  not get better with medicine. You vomit every time you eat or drink. You have pain that does not get better with medicine. You cannot urinate or have bloody urine. You develop a skin rash. You have a fever. Get help right away if: You have trouble breathing. You have chest pain. You vomit blood. These symptoms may be an emergency. Get help right away. Call 911. Do not wait to see if the symptoms will go away. Do not drive yourself to the hospital. Summary After the procedure, it  is common to have a sore throat, hoarseness, nausea, vomiting, or to feel weak, sleepy, or fatigue. For the time period you were told by your health care provider, do not drive or use machinery. Get help right away if you have difficulty breathing, have chest pain, or vomit blood. These symptoms may be an emergency. This information is not intended to replace advice given to you by your health care provider. Make sure you discuss any questions you have with your health care provider. Document Revised: 07/21/2021 Document Reviewed: 07/21/2021 Elsevier Patient Education  2024 [employer].

## 2024-01-16 ENCOUNTER — Encounter (HOSPITAL_COMMUNITY)
Admission: RE | Admit: 2024-01-16 | Discharge: 2024-01-16 | Disposition: A | Discharge status: Home / Self Care | Source: Ambulatory Visit | Attending: Orthopedic Surgery | Admitting: Orthopedic Surgery

## 2024-01-16 ENCOUNTER — Other Ambulatory Visit: Payer: Self-pay

## 2024-01-16 DIAGNOSIS — Z01818 Encounter for other preprocedural examination: Secondary | ICD-10-CM | POA: Insufficient documentation

## 2024-01-16 DIAGNOSIS — M65332 Trigger finger, left middle finger: Secondary | ICD-10-CM | POA: Diagnosis not present

## 2024-01-16 LAB — CBC WITH DIFFERENTIAL/PLATELET
Abs Immature Granulocytes: 0.02 K/uL (ref 0.00–0.07)
Basophils Absolute: 0.1 K/uL (ref 0.0–0.1)
Basophils Relative: 1 %
Eosinophils Absolute: 0.3 K/uL (ref 0.0–0.5)
Eosinophils Relative: 4 %
HCT: 38.3 % (ref 36.0–46.0)
Hemoglobin: 12.4 g/dL (ref 12.0–15.0)
Immature Granulocytes: 0 %
Lymphocytes Relative: 29 %
Lymphs Abs: 2.1 K/uL (ref 0.7–4.0)
MCH: 32.3 pg (ref 26.0–34.0)
MCHC: 32.4 g/dL (ref 30.0–36.0)
MCV: 99.7 fL (ref 80.0–100.0)
Monocytes Absolute: 0.4 K/uL (ref 0.1–1.0)
Monocytes Relative: 6 %
Neutro Abs: 4.3 K/uL (ref 1.7–7.7)
Neutrophils Relative %: 60 %
Platelets: 264 K/uL (ref 150–400)
RBC: 3.84 MIL/uL — ABNORMAL LOW (ref 3.87–5.11)
RDW: 12.6 % (ref 11.5–15.5)
WBC: 7.3 K/uL (ref 4.0–10.5)
nRBC: 0 % (ref 0.0–0.2)

## 2024-01-16 LAB — BASIC METABOLIC PANEL WITH GFR
Anion gap: 14 (ref 5–15)
BUN: 20 mg/dL (ref 8–23)
CO2: 23 mmol/L (ref 22–32)
Calcium: 8.7 mg/dL — ABNORMAL LOW (ref 8.9–10.3)
Chloride: 105 mmol/L (ref 98–111)
Creatinine, Ser: 1.08 mg/dL — ABNORMAL HIGH (ref 0.44–1.00)
GFR, Estimated: 54 mL/min — ABNORMAL LOW (ref >60)
Glucose, Bld: 92 mg/dL (ref 70–99)
Potassium: 4.2 mmol/L (ref 3.5–5.1)
Sodium: 142 mmol/L (ref 135–145)

## 2024-01-19 ENCOUNTER — Other Ambulatory Visit: Payer: Self-pay | Admitting: Cardiovascular Disease

## 2024-01-20 NOTE — H&P (Signed)
 Chief Complaint  Patient presents with   Hand Pain      Left long trigger finger          Encounter Diagnosis  Name Primary?   Trigger finger, left middle finger Yes      DOI/DOS/ Date: Symptoms started in February 2024 2 injections previously   Unchanged- it got a little better with injection but ready to do the surgery         Chief Complaint  Patient presents with   Hand Pain      Left long trigger finger    73 year old female status post 2 injections for left middle finger trigger phenomena.  She would like to proceed with surgery now that the symptoms have come back and she has lost range of motion in her left long finger   Review of systems recent death of her grandson who fought a long battle with respiratory issues related to medical problems   Otherwise normal       Past Medical History:  Diagnosis Date   Arthritis     Coronary artery disease      NATIVE VESSEL (ICD-414.01)-cath 08/17/09 with subtotally occluded RCA, moderate disease LAD.   Hyperlipidemia     Hypertension     Hypotension     Myocardial infarction (HCC) 08/17/2009   Tobacco user               Past Surgical History:  Procedure Laterality Date   ABDOMINAL HYSTERECTOMY   1980s    laparoscopic   CARDIAC CATHETERIZATION N/A 12/30/2015    Procedure: Left Heart Cath and Coronary Angiography;  Surgeon: Lonni JONETTA Cash, MD;  Location: [facility] INVASIVE CV LAB;  Service: Cardiovascular;  Laterality: N/A;   CARDIAC CATHETERIZATION N/A 12/30/2015    Procedure: Coronary Stent Intervention;  Surgeon: Lonni JONETTA Cash, MD;  Location: [facility] INVASIVE CV LAB;  Service: Cardiovascular;  Laterality: N/A;   CORONARY ANGIOPLASTY WITH STENT PLACEMENT   08/17/2009    1 stent   KNEE ARTHROSCOPY WITH MEDIAL MENISECTOMY Right 01/30/2022    Procedure: KNEE ARTHROSCOPY WITH MEDIAL MENISECTOMY;  Surgeon: Margrette Taft BRAVO, MD;  Location: [facility] OR[facility];  Service: Orthopedics;  Laterality: Right;   TOTAL HIP  ARTHROPLASTY Right 06/17/2020    Procedure: RIGHT TOTAL HIP ARTHROPLASTY ANTERIOR APPROACH;  Surgeon: Vernetta Lonni GRADE, MD;  Location: [facility];  Service: Orthopedics;  Laterality: Right;   TOTAL HIP ARTHROPLASTY Left 10/21/2020    Procedure: LEFT TOTAL HIP ARTHROPLASTY ANTERIOR APPROACH;  Surgeon: Vernetta Lonni GRADE, MD;  Location: [facility];  Service: Orthopedics;  Laterality: Left;   TUBAL LIGATION   ~ 1980             Family History  Problem Relation Age of Onset   Hypertension Mother     Heart attack Mother     Heart disease Father          bypass   Heart disease Brother          bypass   Coronary artery disease Other     Colon cancer Neg Hx          Social History  Social History         Tobacco Use   Smoking status: Former      Current packs/day: 0.00      Average packs/day: 0.5 packs/day for 33.0 years (16.5 ttl pk-yrs)      Types: Cigarettes      Start date:  05/07/1980      Quit date: 05/07/2013      Years since quitting: 10.6   Smokeless tobacco: Never  Vaping Use   Vaping status: Never Used  Substance Use Topics   Alcohol use: Not Currently      Comment: 12/30/2015 might have a drink once/year   Drug use: No          Current Outpatient Medications  Medication Instructions   ascorbic acid  (VITAMIN C) 500 mg, Every morning   aspirin  EC 81 mg, Every morning   B Complex-C (B-COMPLEX WITH VITAMIN C) tablet 1 tablet, Every morning   Biotin  5,000 mcg, Every morning   cholecalciferol  (VITAMIN D3) 1,000 Units, Every morning   doxycycline  (VIBRAMYCIN ) 100 mg, Oral, 2 times daily   furosemide  (LASIX ) 40 mg, Oral, Daily PRN   LORazepam  (ATIVAN ) 0.5 mg, Oral, 2 times daily   lovastatin  (MEVACOR ) 40 MG tablet TAKE 1 TABLET(40 MG) BY MOUTH AT BEDTIME   metoprolol  succinate (TOPROL -XL) 25 MG 24 hr tablet TAKE 1/2 TABLET(12.5 MG) BY MOUTH DAILY   milk thistle 175 mg, Every morning    Allergies       Allergies  Allergen Reactions   Lipitor [Atorvastatin ] Other  (See Comments)      Leg pain      Physical Exam Vitals and nursing note reviewed.  Constitutional:      Appearance: Normal appearance.  HENT:     Head: Normocephalic and atraumatic.  Eyes:     General: No scleral icterus.       Right eye: No discharge.        Left eye: No discharge.     Extraocular Movements: Extraocular movements intact.     Conjunctiva/sclera: Conjunctivae normal.     Pupils: Pupils are equal, round, and reactive to light.  Cardiovascular:     Rate and Rhythm: Normal rate.     Pulses: Normal pulses.  Skin:    General: Skin is warm and dry.     Capillary Refill: Capillary refill takes less than 2 seconds.  Neurological:     General: No focal deficit present.     Mental Status: She is alert and oriented to person, place, and time.  Psychiatric:        Mood and Affect: Mood normal.        Behavior: Behavior normal.        Thought Content: Thought content normal.        Judgment: Judgment normal.     Left long or middle finger tenderness over the A1 pulley decreased flexion Normal extension Color normal temperature normal vascular normal capillary refill normal Tendon function intact   Diagnosis       Encounter Diagnosis  Name Primary?   Trigger finger, left middle finger Yes      The procedure has been fully reviewed with the patient; The risks and benefits of surgery have been discussed and explained and understood. Alternative treatment has also been reviewed, questions were encouraged and answered. The postoperative plan is also been reviewed.   Last injection 9 months ago   Planned procedure trigger finger release left long/middle finger            Electronically signed by Margrette Taft BRAVO, MD at 01/13/2024  3:25 PM Office Visit on 01/13/2024  Office Visit on 01/13/2024   Detailed Report  Note shared with patient Additional Documentation  Flowsheets:  Method of Visit   Encounter Info:  Billing Info, History, Allergies, Detailed  Report    Orders Placed  Ambulatory Referral For Surgery Scheduling Authorized Procedural/ Surgical Case Request: RELEASE, A1 PULLEY, FOR TRIGGER FINGER left Middle (long) finger Once Medication Changes    Medication List Visit Diagnoses   Trigger finger, left middle finger  Pre-op exam

## 2024-01-21 ENCOUNTER — Ambulatory Visit (HOSPITAL_COMMUNITY)
Admission: RE | Admit: 2024-01-21 | Discharge: 2024-01-21 | Disposition: A | Discharge status: Home / Self Care | Attending: Orthopedic Surgery | Admitting: Orthopedic Surgery

## 2024-01-21 ENCOUNTER — Encounter (HOSPITAL_COMMUNITY)
Admission: RE | Disposition: A | Discharge status: Home / Self Care | Payer: Self-pay | Source: Home / Self Care | Attending: Orthopedic Surgery

## 2024-01-21 ENCOUNTER — Ambulatory Visit (HOSPITAL_COMMUNITY): Admitting: Certified Registered"

## 2024-01-21 ENCOUNTER — Ambulatory Visit (HOSPITAL_BASED_OUTPATIENT_CLINIC_OR_DEPARTMENT_OTHER): Admitting: Certified Registered"

## 2024-01-21 ENCOUNTER — Encounter (HOSPITAL_COMMUNITY): Payer: Self-pay | Admitting: Orthopedic Surgery

## 2024-01-21 DIAGNOSIS — Z87891 Personal history of nicotine dependence: Secondary | ICD-10-CM

## 2024-01-21 DIAGNOSIS — I10 Essential (primary) hypertension: Secondary | ICD-10-CM | POA: Insufficient documentation

## 2024-01-21 DIAGNOSIS — Z8249 Family history of ischemic heart disease and other diseases of the circulatory system: Secondary | ICD-10-CM | POA: Insufficient documentation

## 2024-01-21 DIAGNOSIS — I2510 Atherosclerotic heart disease of native coronary artery without angina pectoris: Secondary | ICD-10-CM | POA: Diagnosis not present

## 2024-01-21 DIAGNOSIS — F419 Anxiety disorder, unspecified: Secondary | ICD-10-CM | POA: Diagnosis not present

## 2024-01-21 DIAGNOSIS — Z955 Presence of coronary angioplasty implant and graft: Secondary | ICD-10-CM | POA: Insufficient documentation

## 2024-01-21 DIAGNOSIS — M65332 Trigger finger, left middle finger: Secondary | ICD-10-CM

## 2024-01-21 DIAGNOSIS — I252 Old myocardial infarction: Secondary | ICD-10-CM | POA: Diagnosis not present

## 2024-01-21 HISTORY — PX: TRIGGER FINGER RELEASE: SHX641

## 2024-01-21 SURGERY — RELEASE, A1 PULLEY, FOR TRIGGER FINGER
Anesthesia: General | Site: Middle Finger | Laterality: Left

## 2024-01-21 MED ORDER — CHLORHEXIDINE GLUCONATE 0.12 % MT SOLN: 15.0000 mL | Freq: Once | OROMUCOSAL | Status: DC

## 2024-01-21 MED ORDER — LACTATED RINGERS IV SOLN: INTRAVENOUS | Status: DC

## 2024-01-21 MED ORDER — CEFAZOLIN SODIUM-DEXTROSE 2-4 GM/100ML-% IV SOLN
2.0000 g | INTRAVENOUS | Status: AC
  Administered 2024-01-21: 2 g via INTRAVENOUS

## 2024-01-21 MED ORDER — FENTANYL CITRATE (PF) 100 MCG/2ML IJ SOLN
INTRAMUSCULAR | Status: DC | PRN
  Administered 2024-01-21 (×4): 25 ug via INTRAVENOUS

## 2024-01-21 MED ORDER — FENTANYL CITRATE PF 50 MCG/ML IJ SOSY: 25.0000 ug | PREFILLED_SYRINGE | INTRAMUSCULAR | Status: DC | PRN

## 2024-01-21 MED ORDER — ONDANSETRON HCL 4 MG/2ML IJ SOLN: 4.0000 mg | Freq: Once | INTRAMUSCULAR | Status: DC | PRN

## 2024-01-21 MED ORDER — CHLORHEXIDINE GLUCONATE 0.12 % MT SOLN
15.0000 mL | Freq: Once | OROMUCOSAL | Status: AC
  Administered 2024-01-21: 15 mL via OROMUCOSAL

## 2024-01-21 MED ORDER — HYDROCODONE-ACETAMINOPHEN 5-325 MG PO TABS: 1.0000 | ORAL_TABLET | Freq: Four times a day (QID) | ORAL | 0 refills | Status: DC | PRN

## 2024-01-21 MED ORDER — LIDOCAINE 2% (20 MG/ML) 5 ML SYRINGE
INTRAMUSCULAR | Status: AC
  Filled 2024-01-21: qty 5

## 2024-01-21 MED ORDER — EPHEDRINE SULFATE-NACL 50-0.9 MG/10ML-% IV SOSY
PREFILLED_SYRINGE | INTRAVENOUS | Status: DC | PRN
Start: 2024-01-21 — End: 2024-01-21
  Administered 2024-01-21: 10 mg via INTRAVENOUS

## 2024-01-21 MED ORDER — BUPIVACAINE HCL (PF) 0.5 % IJ SOLN
INTRAMUSCULAR | Status: AC
  Filled 2024-01-21: qty 30

## 2024-01-21 MED ORDER — LIDOCAINE 2% (20 MG/ML) 5 ML SYRINGE
INTRAMUSCULAR | Status: DC | PRN
Start: 2024-01-21 — End: 2024-01-21
  Administered 2024-01-21: 60 mg via INTRAVENOUS

## 2024-01-21 MED ORDER — ONDANSETRON HCL 4 MG/2ML IJ SOLN
INTRAMUSCULAR | Status: DC | PRN
Start: 2024-01-21 — End: 2024-01-21
  Administered 2024-01-21: 4 mg via INTRAVENOUS

## 2024-01-21 MED ORDER — ORAL CARE MOUTH RINSE: 15.0000 mL | Freq: Once | OROMUCOSAL | Status: DC

## 2024-01-21 MED ORDER — DEXAMETHASONE SODIUM PHOSPHATE 10 MG/ML IJ SOLN
INTRAMUSCULAR | Status: DC | PRN
Start: 2024-01-21 — End: 2024-01-21
  Administered 2024-01-21: 6 mg via INTRAVENOUS

## 2024-01-21 MED ORDER — BUPIVACAINE HCL (PF) 0.5 % IJ SOLN
INTRAMUSCULAR | Status: DC | PRN
  Administered 2024-01-21: 10 mL

## 2024-01-21 MED ORDER — PROPOFOL 10 MG/ML IV BOLUS
INTRAVENOUS | Status: AC
  Filled 2024-01-21: qty 20

## 2024-01-21 MED ORDER — FENTANYL CITRATE (PF) 100 MCG/2ML IJ SOLN
INTRAMUSCULAR | Status: AC
  Filled 2024-01-21: qty 2

## 2024-01-21 MED ORDER — PROPOFOL 10 MG/ML IV BOLUS
INTRAVENOUS | Status: DC | PRN
Start: 2024-01-21 — End: 2024-01-21
  Administered 2024-01-21: 150 mg via INTRAVENOUS
  Administered 2024-01-21: 30 mg via INTRAVENOUS

## 2024-01-21 MED ORDER — SODIUM CHLORIDE 0.9 % IR SOLN
Status: DC | PRN
  Administered 2024-01-21: 1000 mL

## 2024-01-21 MED ORDER — OXYCODONE HCL 5 MG/5ML PO SOLN: 5.0000 mg | Freq: Once | ORAL | Status: DC | PRN

## 2024-01-21 MED ORDER — OXYCODONE HCL 5 MG PO TABS: 5.0000 mg | ORAL_TABLET | Freq: Once | ORAL | Status: DC | PRN

## 2024-01-21 MED ORDER — CEFAZOLIN SODIUM-DEXTROSE 2-4 GM/100ML-% IV SOLN
INTRAVENOUS | Status: AC
  Filled 2024-01-21: qty 100

## 2024-01-21 SURGICAL SUPPLY — 33 items
BANDAGE ESMARK 4X12 BL STRL LF (DISPOSABLE) ×1 IMPLANT
BLADE SURG 15 STRL LF DISP TIS (BLADE) ×1 IMPLANT
BNDG COHESIVE 3X5 TAN STRL LF (GAUZE/BANDAGES/DRESSINGS) ×1 IMPLANT
BNDG ELASTIC 2X5.8 VLCR NS LF (GAUZE/BANDAGES/DRESSINGS) ×1 IMPLANT
BNDG GAUZE ROLL STR 2.25X3YD (GAUZE/BANDAGES/DRESSINGS) IMPLANT
CHLORAPREP W/TINT 26 (MISCELLANEOUS) ×1 IMPLANT
CLOTH BEACON ORANGE TIMEOUT ST (SAFETY) ×1 IMPLANT
COVER LIGHT HANDLE STERIS (MISCELLANEOUS) ×2 IMPLANT
CUFF TOURN SGL QUICK 18X4 (TOURNIQUET CUFF) ×1 IMPLANT
DRAPE HALF SHEET 40X57 (DRAPES) ×1 IMPLANT
ELECT NDL TIP 2.8 STRL (NEEDLE) ×1 IMPLANT
ELECT NEEDLE TIP 2.8 STRL (NEEDLE) ×1 IMPLANT
ELECTRODE REM PT RTRN 9FT ADLT (ELECTROSURGICAL) ×1 IMPLANT
GAUZE SPONGE 4X4 12PLY STRL (GAUZE/BANDAGES/DRESSINGS) ×1 IMPLANT
GAUZE XEROFORM 1X8 LF (GAUZE/BANDAGES/DRESSINGS) ×1 IMPLANT
GLOVE BIO SURGEON STRL SZ7 (GLOVE) IMPLANT
GLOVE BIOGEL PI IND STRL 7.0 (GLOVE) ×2 IMPLANT
GLOVE BIOGEL PI IND STRL 8.5 (GLOVE) ×1 IMPLANT
GLOVE SKINSENSE STRL SZ8.0 LF (GLOVE) ×1 IMPLANT
GOWN STRL REUS W/TWL LRG LVL3 (GOWN DISPOSABLE) ×1 IMPLANT
GOWN STRL REUS W/TWL XL LVL3 (GOWN DISPOSABLE) ×1 IMPLANT
KIT TURNOVER KIT A (KITS) ×1 IMPLANT
MANIFOLD NEPTUNE II (INSTRUMENTS) ×1 IMPLANT
NDL HYPO 21X1.5 SAFETY (NEEDLE) ×1 IMPLANT
NEEDLE HYPO 21X1.5 SAFETY (NEEDLE) ×1 IMPLANT
NS IRRIG 1000ML POUR BTL (IV SOLUTION) ×1 IMPLANT
PACK BASIC LIMB (CUSTOM PROCEDURE TRAY) ×1 IMPLANT
PAD ARMBOARD POSITIONER FOAM (MISCELLANEOUS) ×1 IMPLANT
POSITIONER HAND ALUMI XLG (MISCELLANEOUS) ×1 IMPLANT
POSITIONER HEAD 8X9X4 ADT (SOFTGOODS) ×1 IMPLANT
SET BASIN LINEN APH (SET/KITS/TRAYS/PACK) ×1 IMPLANT
SUT 3-0 BLK 1X30 PSL (SUTURE) ×1 IMPLANT
SYR CONTROL 10ML LL (SYRINGE) ×1 IMPLANT

## 2024-01-21 NOTE — Op Note (Signed)
 01/21/2024  11:17 AM  PATIENT:  Angel Rivera Agent  73 y.o. female  PRE-OPERATIVE DIAGNOSIS:  left middle trigger finger (long finger)  POST-OPERATIVE DIAGNOSIS:  left middle trigger finger (long finger)  PROCEDURE:  Procedure(s): RELEASE, A1 PULLEY, FOR TRIGGER FINGER left Middle (long) finger (Left), long finger  SURGEON:  Surgeons and Role:    DEWAINE Margrette Taft FORBES, MD - Primary  Findings stenosing tenosynovitis tight A1 pulley left long finger  Details of procedure  The patient was identified in the preop area reevaluated cleared for surgery.  The surgical site was confirmed and marked as left long finger She was taken to the  Waiting room for general anesthesia with an LMA In the supine position  Tourniquet left arm  The left arm was prepped and draped sterilely after the timeout was completed  A transverse incision was made over the A1 pulley the subcutaneous tissue was divided the neurovascular structures were protected retractors were placed the proximal end of the A1 pulley was identified a blunt instrument was placed beneath it and then it was incised sharply.  The finger was flexed and extended to ensure full release.  The wound was irrigated and closed with 2, 3-0 nylon sutures in interrupted fashion with horizontal mattress suture  We injected 10 cc of Marcaine  and placed a sterile dressing after the tourniquet was released   PHYSICIAN ASSISTANT:   ASSISTANTS: none   ANESTHESIA:   LMA general  EBL: No blood loss  BLOOD ADMINISTERED:none  DRAINS: none   LOCAL MEDICATIONS USED:  MARCAINE      SPECIMEN:  No Specimen  DISPOSITION OF SPECIMEN:  N/A  COUNTS:  YES  TOURNIQUET:   Total Tourniquet Time Documented: Upper Arm (Left) - 9 minutes Total: Upper Arm (Left) - 9 minutes   DICTATION: .Nechama Dictation  PLAN OF CARE: Discharge to home after PACU  PATIENT DISPOSITION:  PACU - hemodynamically stable.   Delay start of Pharmacological VTE agent  (>24hrs) due to surgical blood loss or risk of bleeding: Not applicable   Postop plan  Remove dressing in 2 days  She can start immediate range of motion today  She can take a shower when she places a glove over the hand at postop day #2  Follow-up in 2 weeks to remove sutures

## 2024-01-21 NOTE — Interval H&P Note (Signed)
 History and Physical Interval Note:  01/21/2024 10:44 AM  Angel Rivera  has presented today for surgery, with the diagnosis of left middle  trigger finger.  The various methods of treatment have been discussed with the patient and family. After consideration of risks, benefits and other options for treatment, the patient has consented to  Procedure(s): RELEASE, A1 PULLEY, FOR TRIGGER FINGER left Middle (long) finger (Left) as a surgical intervention.  The patient's history has been reviewed, patient examined, no change in status, stable for surgery.  I have reviewed the patient's chart and labs.  Questions were answered to the patient's satisfaction.     [name]

## 2024-01-21 NOTE — Transfer of Care (Addendum)
 Immediate Anesthesia Transfer of Care Note  Patient: Angel Rivera  Procedure(s) Performed: RELEASE, A1 PULLEY, FOR TRIGGER FINGER left Middle (long) finger (Left: Middle Finger)  Patient Location: PACU  Anesthesia Type:General  Level of Consciousness: awake and patient cooperative  Airway & Oxygen Therapy: Patient Spontanous Breathing and Patient connected to nasal cannula oxygen  Post-op Assessment: Report given to RN and Post -op Vital signs reviewed and stable  Post vital signs: Reviewed and stable  Last Vitals:  Vitals Value Taken Time  BP 130/68 01/21/24 11:30  Temp 36.7 01/21/24   11:51  Pulse 83 01/21/24 11:31  Resp 16 01/21/24 11:31  SpO2 100 % 01/21/24 11:31  Vitals shown include unfiled device data.  Last Pain:  Vitals:   01/21/24 0838  PainSc: 0-No pain         Complications: No notable events documented.

## 2024-01-21 NOTE — Addendum Note (Signed)
 Addendum  created 01/21/24 1411 by Para Jerelene CROME, CRNA   Clinical Note Signed, Flowsheet accepted

## 2024-01-21 NOTE — Anesthesia Postprocedure Evaluation (Signed)
 Anesthesia Post Note  Patient: Angel Rivera  Procedure(s) Performed: RELEASE, A1 PULLEY, FOR TRIGGER FINGER left Middle (long) finger (Left: Middle Finger)  Patient location during evaluation: Phase II Anesthesia Type: General Level of consciousness: awake Pain management: pain level controlled Vital Signs Assessment: post-procedure vital signs reviewed and stable Respiratory status: spontaneous breathing and respiratory function stable Cardiovascular status: blood pressure returned to baseline and stable Postop Assessment: no headache and no apparent nausea or vomiting Anesthetic complications: no Comments: Late entry   No notable events documented.   Last Vitals:  Vitals:   01/21/24 1145 01/21/24 1151  BP: (!) 142/65 132/66  Pulse: 78 75  Resp: 18 15  Temp:  36.7 C  SpO2: 100% 100%    Last Pain:  Vitals:   01/21/24 1151  TempSrc: Oral  PainSc: 0-No pain                 Yvonna JINNY Bosworth

## 2024-01-21 NOTE — Anesthesia Preprocedure Evaluation (Signed)
 Anesthesia Evaluation  Patient identified by MRN, date of birth, ID band Patient awake    Reviewed: Allergy & Precautions, H&P , NPO status , Patient's Chart, lab work & pertinent test results, reviewed documented beta blocker date and time   Airway Mallampati: II  TM Distance: >3 FB Neck ROM: full    Dental no notable dental hx.    Pulmonary neg pulmonary ROS, former smoker   Pulmonary exam normal breath sounds clear to auscultation       Cardiovascular Exercise Tolerance: Good hypertension, + CAD, + Past MI and + Cardiac Stents   Rhythm:regular Rate:Normal     Neuro/Psych  PSYCHIATRIC DISORDERS Anxiety     negative neurological ROS     GI/Hepatic negative GI ROS, Neg liver ROS,,,  Endo/Other  negative endocrine ROS    Renal/GU negative Renal ROS  negative genitourinary   Musculoskeletal   Abdominal   Peds  Hematology negative hematology ROS (+)   Anesthesia Other Findings   Reproductive/Obstetrics negative OB ROS                              Anesthesia Physical Anesthesia Plan  ASA: 3  Anesthesia Plan: General and General LMA   Post-op Pain Management:    Induction:   PONV Risk Score and Plan: Ondansetron   Airway Management Planned:   Additional Equipment:   Intra-op Plan:   Post-operative Plan:   Informed Consent: I have reviewed the patients History and Physical, chart, labs and discussed the procedure including the risks, benefits and alternatives for the proposed anesthesia with the patient or authorized representative who has indicated his/her understanding and acceptance.     Dental Advisory Given  Plan Discussed with: CRNA  Anesthesia Plan Comments:         Anesthesia Quick Evaluation

## 2024-01-21 NOTE — Brief Op Note (Addendum)
 01/21/2024  11:17 AM  PATIENT:  Angel Rivera  73 y.o. female  PRE-OPERATIVE DIAGNOSIS:  left middle trigger finger (long finger)  POST-OPERATIVE DIAGNOSIS:  left middle trigger finger (long finger)  PROCEDURE:  Procedure(s): RELEASE, A1 PULLEY, FOR TRIGGER FINGER left Middle (long) finger (Left), long finger  SURGEON:  Surgeons and Role:    DEWAINE Margrette Taft FORBES, MD - Primary  Findings stenosing tenosynovitis tight A1 pulley left long finger  Details of procedure  The patient was identified in the preop area reevaluated cleared for surgery.  The surgical site was confirmed and marked as left long finger She was taken to the  Waiting room for general anesthesia with an LMA In the supine position  Tourniquet left arm  The left arm was prepped and draped sterilely after the timeout was completed  A transverse incision was made over the A1 pulley the subcutaneous tissue was divided the neurovascular structures were protected retractors were placed the proximal end of the A1 pulley was identified a blunt instrument was placed beneath it and then it was incised sharply.  The finger was flexed and extended to ensure full release.  The wound was irrigated and closed with 2, 3-0 nylon sutures in interrupted fashion with horizontal mattress suture  We injected 10 cc of Marcaine  and placed a sterile dressing after the tourniquet was released   PHYSICIAN ASSISTANT:   ASSISTANTS: none   ANESTHESIA:   LMA general  EBL: No blood loss  BLOOD ADMINISTERED:none  DRAINS: none   LOCAL MEDICATIONS USED:  MARCAINE      SPECIMEN:  No Specimen  DISPOSITION OF SPECIMEN:  N/A  COUNTS:  YES  TOURNIQUET:   Total Tourniquet Time Documented: Upper Arm (Left) - 9 minutes Total: Upper Arm (Left) - 9 minutes   DICTATION: .Nechama Dictation  PLAN OF CARE: Discharge to home after PACU  PATIENT DISPOSITION:  PACU - hemodynamically stable.   Delay start of Pharmacological VTE Rivera  (>24hrs) due to surgical blood loss or risk of bleeding: Not applicable   Postop plan  Remove dressing in 2 days  She can start immediate range of motion today  She can take a shower when she places a glove over the hand at postop day #2  Follow-up in 2 weeks to remove sutures

## 2024-01-21 NOTE — Anesthesia Procedure Notes (Signed)
 Procedure Name: LMA Insertion Date/Time: 01/21/2024 10:54 AM  Performed by: Para Jerelene CROME, CRNAPre-anesthesia Checklist: Patient identified, Emergency Drugs available, Patient being monitored and Suction available Patient Re-evaluated:Patient Re-evaluated prior to induction Oxygen Delivery Method: Circle system utilized Preoxygenation: Pre-oxygenation with 100% oxygen Induction Type: IV induction LMA: LMA inserted LMA Size: 4.0 Placement Confirmation: positive ETCO2, CO2 detector and breath sounds checked- equal and bilateral ETT to lip (cm): No marking on Ambu Aurastraight LMA size 4. Tube secured with: Tape Comments: Atraumatic insertion of LMA size 4. Lips and teeth remain in preoperative condition.

## 2024-01-22 ENCOUNTER — Ambulatory Visit: Admitting: Orthopedic Surgery

## 2024-01-22 ENCOUNTER — Encounter (HOSPITAL_COMMUNITY): Payer: Self-pay | Admitting: Orthopedic Surgery

## 2024-01-22 DIAGNOSIS — Z09 Encounter for follow-up examination after completed treatment for conditions other than malignant neoplasm: Secondary | ICD-10-CM

## 2024-01-22 NOTE — Progress Notes (Signed)
   Chief Complaint  Patient presents with   Follow-up    Left middle finger had surger 09/16- c/o numbness and tingling    Postop day 1 status post trigger finger release left long or middle finger complains of numbness and tingling of the index long and ring finger  The patient did receive a postop digital block.  Although it is lasting longer than usual this is the most likely source of the persistent numbness  Patient has been able to bend the fingers a little bit but is still stiffer than I would think at this point encouraged her to continue with range of motion exercises  Follow-up in 2 weeks for suture removal

## 2024-02-05 ENCOUNTER — Encounter: Payer: Self-pay | Admitting: Orthopedic Surgery

## 2024-02-05 ENCOUNTER — Ambulatory Visit (INDEPENDENT_AMBULATORY_CARE_PROVIDER_SITE_OTHER): Admitting: Orthopedic Surgery

## 2024-02-05 DIAGNOSIS — M722 Plantar fascial fibromatosis: Secondary | ICD-10-CM

## 2024-02-05 DIAGNOSIS — M65332 Trigger finger, left middle finger: Secondary | ICD-10-CM

## 2024-02-05 MED ORDER — METHYLPREDNISOLONE ACETATE 40 MG/ML IJ SUSP
40.0000 mg | Freq: Once | INTRAMUSCULAR | Status: AC
  Administered 2024-02-05: 40 mg via INTRA_ARTICULAR

## 2024-02-05 NOTE — Progress Notes (Signed)
 [facility]  Postop appointment  Status post trigger finger release left long finger  Patient plaint some burning at the incision site  No locking or triggering  Sutures removed  Wound clean dry intact full range of motion  At the end of the visit patient was noted to have some plantar fascial pain had tenderness right over the plantar fascia and requested evaluation and management  Pain of heel plantar aspect noted on restart and start up  Exam shows tenderness at the plantar fascia with normal arch.  Achilles tendon intact.  Diagnosis plantar fasciitis  Injection  Procedure note Inject plantar fascia    Timeout was completed to confirm the site of injection right foot  The medications used were 40 mg of Depo-Medrol  and 1% lidocaine  3 cc  Anesthesia was provided by ethyl chloride and the skin was prepped with alcohol.  After cleaning the skin with alcohol a 25-gauge needle was used to inject the plantar fascia, no complications were noted sterile bandage was applied

## 2024-02-05 NOTE — Progress Notes (Signed)
    02/05/2024   Chief Complaint  Patient presents with   Post-op Follow-up    LEFT middle trigger finger release     Encounter Diagnosis  Name Primary?   Trigger finger left middle finger s/p release 01/21/24 Yes    What pharmacy do you use ? ______[facility]_____________________  DOI/DOS/ Date: 01/21/24  Improved

## 2024-02-10 ENCOUNTER — Telehealth: Payer: Self-pay

## 2024-02-10 NOTE — Telephone Encounter (Signed)
 Copied from CRM [id number]. Topic: Clinical - Medical Advice >> Feb 10, 2024  2:13 PM Corin V wrote: Reason for CRM: Patient is calling to confirm if she has had a tetanus shot recently and when it was. Patient has a flu shot today at 2:50 and would like to know before then if possible. Advised it may not be before the shot appointment. Call back: [phone]

## 2024-02-10 NOTE — Telephone Encounter (Signed)
 Pt advised that she had the tetanus shot at dr. Nemiah office, but on [facility] she has not received one states she will make a nurse visit to complete this

## 2024-02-13 NOTE — Telephone Encounter (Signed)
 Pt advised with verbal understanding

## 2024-02-17 ENCOUNTER — Other Ambulatory Visit: Payer: Self-pay

## 2024-02-17 DIAGNOSIS — F411 Generalized anxiety disorder: Secondary | ICD-10-CM

## 2024-02-17 DIAGNOSIS — F5104 Psychophysiologic insomnia: Secondary | ICD-10-CM

## 2024-02-19 ENCOUNTER — Other Ambulatory Visit (HOSPITAL_COMMUNITY): Payer: Self-pay

## 2024-02-19 DIAGNOSIS — Z1231 Encounter for screening mammogram for malignant neoplasm of breast: Secondary | ICD-10-CM

## 2024-02-28 ENCOUNTER — Ambulatory Visit: Payer: PPO | Admitting: Internal Medicine

## 2024-04-01 ENCOUNTER — Ambulatory Visit (HOSPITAL_COMMUNITY)

## 2024-04-06 ENCOUNTER — Encounter (HOSPITAL_COMMUNITY): Payer: Self-pay

## 2024-04-06 ENCOUNTER — Ambulatory Visit (HOSPITAL_COMMUNITY): Admission: RE | Admit: 2024-04-06 | Discharge: 2024-04-06 | Disposition: A | Source: Ambulatory Visit

## 2024-04-06 DIAGNOSIS — Z1231 Encounter for screening mammogram for malignant neoplasm of breast: Secondary | ICD-10-CM | POA: Insufficient documentation

## 2024-04-10 ENCOUNTER — Other Ambulatory Visit: Payer: Self-pay

## 2024-04-10 DIAGNOSIS — F5104 Psychophysiologic insomnia: Secondary | ICD-10-CM

## 2024-04-10 DIAGNOSIS — F411 Generalized anxiety disorder: Secondary | ICD-10-CM

## 2024-04-10 NOTE — Telephone Encounter (Unsigned)
 Copied from CRM [id number]. Topic: Clinical - Medication Refill >> Apr 10, 2024  1:57 PM Rachelle R wrote: Medication: LORazepam  (ATIVAN ) 0.5 MG tablet  Has the patient contacted their pharmacy? Yes, call dr  This is the patient's preferred pharmacy:  [facility] [place], [place] - [id number] [place] [address] Phone: [phone] Fax: [phone]  Is this the correct pharmacy for this prescription? Yes If no, delete pharmacy and type the correct one.   Has the prescription been filled recently? Yes  Is the patient out of the medication? No  Has the patient been seen for an appointment in the last year OR does the patient have an upcoming appointment? Yes  Can we respond through MyChart? Yes  Agent: Please be advised that Rx refills may take up to 3 business days. We ask that you follow-up with your pharmacy.

## 2024-04-13 MED ORDER — LORAZEPAM 0.5 MG PO TABS: 0.5000 mg | ORAL_TABLET | Freq: Two times a day (BID) | ORAL | 0 refills | Status: AC

## 2024-04-15 ENCOUNTER — Other Ambulatory Visit: Payer: Self-pay

## 2024-04-17 ENCOUNTER — Telehealth: Payer: Self-pay

## 2024-04-17 NOTE — Telephone Encounter (Signed)
 Copied from CRM [id number]. Topic: Clinical - Prescription Issue >> Apr 13, 2024 12:14 PM Charlet HERO wrote: Reason for CRM: LORazepam  (ATIVAN ) 0.5 MG tablet was called in by pharmacy on 12/05 to be refilled the med is still showing pending , patient is calling to get the script sent to pharm to be filled she is out of the meds.

## 2024-04-19 ENCOUNTER — Other Ambulatory Visit: Payer: Self-pay | Admitting: Cardiovascular Disease

## 2024-04-20 ENCOUNTER — Other Ambulatory Visit: Payer: Self-pay

## 2024-04-20 DIAGNOSIS — F5104 Psychophysiologic insomnia: Secondary | ICD-10-CM

## 2024-04-20 DIAGNOSIS — F411 Generalized anxiety disorder: Secondary | ICD-10-CM

## 2024-04-20 NOTE — Telephone Encounter (Signed)
 I will call and make pt aware

## 2024-04-20 NOTE — Telephone Encounter (Signed)
 It's showing that it was filled on 04/14/24 at [facility]

## 2024-04-21 ENCOUNTER — Other Ambulatory Visit: Payer: Self-pay

## 2024-04-21 ENCOUNTER — Ambulatory Visit
Admission: EM | Admit: 2024-04-21 | Discharge: 2024-04-21 | Disposition: A | Discharge status: Home / Self Care | Source: Home / Self Care

## 2024-04-21 DIAGNOSIS — W5503XA Scratched by cat, initial encounter: Secondary | ICD-10-CM

## 2024-04-21 DIAGNOSIS — Z23 Encounter for immunization: Secondary | ICD-10-CM | POA: Diagnosis not present

## 2024-04-21 DIAGNOSIS — S61411A Laceration without foreign body of right hand, initial encounter: Secondary | ICD-10-CM

## 2024-04-21 DIAGNOSIS — S60511A Abrasion of right hand, initial encounter: Secondary | ICD-10-CM

## 2024-04-21 MED ORDER — TETANUS-DIPHTH-ACELL PERTUSSIS 5-2-15.5 LF-MCG/0.5 IM SUSP
0.5000 mL | Freq: Once | INTRAMUSCULAR | Status: AC
  Administered 2024-04-21: 09:00:00 0.5 mL via INTRAMUSCULAR

## 2024-04-21 MED ORDER — MUPIROCIN 2 % EX OINT: 1.0000 | TOPICAL_OINTMENT | Freq: Two times a day (BID) | CUTANEOUS | 0 refills | Status: AC

## 2024-04-21 MED ORDER — CHLORHEXIDINE GLUCONATE 4 % EX SOLN: CUTANEOUS | 0 refills | Status: AC

## 2024-04-21 NOTE — ED Provider Notes (Signed)
 [facility]    CSN: [id number] Arrival date & time: 04/21/24  0802      History   Chief Complaint No chief complaint on file.   HPI Angel Rivera is a 73 y.o. female.   The history is provided by the patient.   Patient presents for complaints of a cat scratch to the right hand that last evening.  Patient states she was reaching for something on her bed, when the cats call came down across her right hand.  She states that the area is sore and bruised.  She states bleeding is controlled at this time.  Patient denies numbness, tingling, or radiation of pain.  Patient reports that she is right-hand dominant.  States that she did clean the area and apply Neosporin after the injury occurred.  She states she is unsure of her last tetanus shot. Past Medical History:  Diagnosis Date   Arthritis    Coronary artery disease    NATIVE VESSEL (ICD-414.01)-cath 08/17/09 with subtotally occluded RCA, moderate disease LAD.   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   Unilateral primary osteoarthritis, 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   Unilateral primary osteoarthritis, right hip 03/23/2020   Fracture, [place], left, closed 09/06/17 09/23/2017   Encounter for HCV screening test for high risk patient 05/16/2017   MYOCARDIAL INFARCTION, 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   CAD (coronary artery disease) 09/09/2009    HYPOTENSION 09/09/2009    Past Surgical History:  Procedure Laterality Date   ABDOMINAL HYSTERECTOMY  1980s   laparoscopic   CARDIAC CATHETERIZATION N/A 12/30/2015   Procedure: Left Heart Cath and Coronary Angiography;  Surgeon: Lonni JONETTA Cash, MD;  Location: [facility] INVASIVE CV LAB;  Service: Cardiovascular;  Laterality: N/A;   CARDIAC CATHETERIZATION N/A 12/30/2015   Procedure: Coronary Stent Intervention;  Surgeon: Lonni JONETTA Cash, MD;  Location: [facility] INVASIVE CV LAB;  Service: Cardiovascular;  Laterality: N/A;   CORONARY ANGIOPLASTY WITH STENT PLACEMENT  08/17/2009   1 stent   KNEE ARTHROSCOPY WITH MEDIAL MENISECTOMY Right 01/30/2022   Procedure: KNEE ARTHROSCOPY WITH MEDIAL MENISECTOMY;  Surgeon: Margrette Taft BRAVO, MD;  Location: [facility];  Service: Orthopedics;  Laterality: Right;   TOTAL HIP ARTHROPLASTY Right 06/17/2020   Procedure: RIGHT TOTAL HIP ARTHROPLASTY ANTERIOR APPROACH;  Surgeon: Vernetta Lonni GRADE, MD;  Location: [facility];  Service: Orthopedics;  Laterality: Right;   TOTAL HIP ARTHROPLASTY Left 10/21/2020   Procedure: LEFT TOTAL HIP ARTHROPLASTY ANTERIOR APPROACH;  Surgeon: Vernetta Lonni GRADE, MD;  Location: [facility];  Service: Orthopedics;  Laterality: Left;   TRIGGER FINGER RELEASE Left 01/21/2024   Procedure: RELEASE, A1 PULLEY, FOR TRIGGER FINGER left Middle (long) finger;  Surgeon: Margrette Taft BRAVO, MD;  Location: [facility];  Service: Orthopedics;  Laterality: Left;   TUBAL LIGATION  ~ 1980    OB History   No obstetric history on  file.      Home Medications    Prior to Admission medications  Medication Sig Start Date End Date Taking? Authorizing Provider  ascorbic acid  (VITAMIN C) 500 MG tablet Take 500 mg by mouth in the morning.    [provider]  aspirin  EC 81 MG tablet Take 81 mg by mouth in the morning. Swallow whole.    [provider]  B Complex-C (B-COMPLEX WITH VITAMIN C) tablet Take 1 tablet by mouth in the morning.     [provider]  Biotin  5000 MCG TABS Take 5,000 mcg by mouth in the morning.    [provider]  cholecalciferol  (VITAMIN D ) 25 MCG (1000 UNIT) tablet Take 1,000 Units by mouth in the morning.    [provider]  furosemide  (LASIX ) 40 MG tablet Take 1 tablet (40 mg total) by mouth daily as needed. 06/24/23   Verlin Lonni BIRCH, MD  LORazepam  (ATIVAN ) 0.5 MG tablet Take 1 tablet (0.5 mg total) by mouth 2 (two) times daily. 04/13/24   Bevely Doffing, FNP  lovastatin  (MEVACOR ) 40 MG tablet TAKE 1 TABLET(40 MG) BY MOUTH AT BEDTIME 06/24/23   Verlin Lonni BIRCH, MD  metoprolol  succinate (TOPROL -XL) 25 MG 24 hr tablet TAKE 1/2 TABLET(12.5 MG) BY MOUTH DAILY 01/20/24   Verlin Lonni BIRCH, MD  milk thistle 175 MG tablet Take 175 mg by mouth in the morning.    [provider]    Family History Family History  Problem Relation Age of Onset   Hypertension Mother    Heart attack Mother    Heart disease Father        bypass   Heart disease Brother        bypass   Coronary artery disease Other    Colon cancer Neg Hx     Social History Social History[1]   Allergies   Lipitor [atorvastatin ]   Review of Systems Review of Systems Per HPI  Physical Exam Triage Vital Signs ED Triage Vitals  Encounter Vitals Group     BP 04/21/24 0818 (!) 142/81     Girls Systolic BP Percentile --      Girls Diastolic BP Percentile --      Boys Systolic BP Percentile --      Boys Diastolic BP Percentile --      Pulse --      Resp 04/21/24 0818 18     Temp 04/21/24 0818 98.2 F (36.8 C)     Temp Source 04/21/24 0818 Oral     SpO2 04/21/24 0818 96 %     Weight --      Height --      Head Circumference --      Peak Flow --      Pain Score 04/21/24 0817 7     Pain Loc --      Pain Education --      Exclude from Growth Chart --    No data found.  Updated Vital Signs BP (!) 142/81 (BP Location: Right Arm)   Temp 98.2 F (36.8 C) (Oral)   Resp 18    SpO2 96%   Visual Acuity Right Eye Distance:   Left Eye Distance:   Bilateral Distance:    Right Eye Near:   Left Eye Near:    Bilateral Near:     Physical Exam Vitals and nursing note reviewed.  Constitutional:      General: She is not in acute distress.    Appearance: Normal  appearance.  Eyes:     Extraocular Movements: Extraocular movements intact.     Pupils: Pupils are equal, round, and reactive to light.  Cardiovascular:     Rate and Rhythm: Normal rate and regular rhythm.     Pulses: Normal pulses.     Heart sounds: Normal heart sounds.  Pulmonary:     Effort: Pulmonary effort is normal.  Musculoskeletal:     Cervical back: Normal range of motion.  Skin:    General: Skin is warm and dry.     Findings: Laceration present.     Comments: Laceration noted to the dorsal aspect of the right hand at the base of the 2nd and 3rd digits.  Bleeding is controlled at this time.  See attached image.  Neurological:     General: No focal deficit present.     Mental Status: She is alert and oriented to person, place, and time.  Psychiatric:        Mood and Affect: Mood normal.        Behavior: Behavior normal.      UC Treatments / Results  Labs (all labs ordered are listed, but only abnormal results are displayed) Labs Reviewed - No data to display  EKG   Radiology No results found.  Procedures Laceration Repair  Date/Time: 04/21/2024 8:50 AM  Performed by: Gilmer Etta PARAS, NP Authorized by: Gilmer Etta PARAS, NP   Consent:    Consent obtained:  Verbal   Consent given by:  Patient   Risks, benefits, and alternatives were discussed: yes     Risks discussed:  Infection, poor cosmetic result and poor wound healing   Alternatives discussed:  No treatment Universal protocol:    Procedure explained and questions answered to patient or proxy's satisfaction: yes     Patient identity confirmed:  Verbally with patient Anesthesia:    Anesthesia method:   None Laceration details:    Location:  Hand   Hand location:  R hand, dorsum   Wound length (cm): See attached image. Exploration:    Limited defect created (wound extended): no     Contaminated: no   Treatment:    Wound cleansed with: Wound cleanser.   Amount of cleaning:  Standard Skin repair:    Repair method:  Tissue adhesive and Steri-Strips Approximation:    Approximation:  Close Repair type:    Repair type:  Simple Post-procedure details:    Dressing:  Antibiotic ointment and non-adherent dressing   Procedure completion:  Tolerated Comments:     Approximate 1.5 cm laceration noted to the dorsal aspect of the right hand.  Site was cleansed with wound cleanser, bleeding controlled.  Dermabond applied along with 3 Steri-Strips.  Antibiotic ointment, nonadhesive gauze, and Coban were applied.  Patient tolerated the procedure well.  (including critical care time)  Medications Ordered in UC Medications - No data to display  Initial Impression / Assessment and Plan / UC Course  I have reviewed the triage vital signs and the nursing notes.  Pertinent labs & imaging results that were available during my care of the patient were reviewed by me and considered in my medical decision making (see chart for details).  Patient with approximate 1.5 cm laceration noted to the dorsal aspect of the right hand.  Laceration caused by a cat scratch.  Site was repaired with Dermabond and Steri-Strips.  Patient tolerated the procedure well.  Tdap was updated.  Treat with mupirocin  ointment and Hibiclens  solution.  Supportive care recommendations were  provided and discussed with the patient to include over-the-counter analgesics, the use of ice, and strict wound care.  Discussed indications with the patient regarding follow-up.  Patient was in agreement with this plan of care and verbalizes understanding.  All questions were answered.  Patient stable for discharge.   Final Clinical Impressions(s) / UC  Diagnoses   Final diagnoses:  None   Discharge Instructions   None    ED Prescriptions   None    PDMP not reviewed this encounter.     [1]  Social History Tobacco Use   Smoking status: Former    Current packs/day: 0.00    Average packs/day: 0.5 packs/day for 33.0 years (16.5 ttl pk-yrs)    Types: Cigarettes    Start date: 05/07/1980    Quit date: 05/07/2013    Years since quitting: 10.9   Smokeless tobacco: Never  Vaping Use   Vaping status: Never Used  Substance Use Topics   Alcohol use: Not Currently    Comment: 12/30/2015 might have a drink once/year   Drug use: No     Gilmer Etta PARAS, NP 04/21/24 [phone]

## 2024-04-21 NOTE — ED Triage Notes (Signed)
 Pt her cat has clawed her right hand x 1 day. Pt has a puncture wound on her right hand.

## 2024-04-21 NOTE — Discharge Instructions (Signed)
 Your Tdap was updated today.  It is good for the next 10 years. The laceration to your right hand was repaired with Dermabond and Steri-Strips.  Leave the Dermabond and Steri-Strips in place until they fall off.  Leave the dressing in place for the next 24 hours.  Do not submerge the right hand underwater during that time. You may take over-the-counter Tylenol  as needed for pain or discomfort. Cleanse the affected area twice daily with the solution provided. You may apply ice to the affected area as needed for pain or swelling. Do not pick or disrupt the area while symptoms persist. Monitor for signs of infection to include increased redness, swelling, or foul-smelling drainage from the site.  If you develop any of the symptoms, seek care immediately. Follow-up as needed.

## 2024-04-21 NOTE — Telephone Encounter (Signed)
Pt aware and has already picked up RX.

## 2024-05-12 ENCOUNTER — Encounter: Payer: Self-pay | Admitting: Emergency Medicine

## 2024-05-12 ENCOUNTER — Ambulatory Visit
Admission: EM | Admit: 2024-05-12 | Discharge: 2024-05-12 | Disposition: A | Discharge status: Home / Self Care | Attending: Family Medicine | Admitting: Family Medicine

## 2024-05-12 ENCOUNTER — Other Ambulatory Visit: Payer: Self-pay

## 2024-05-12 DIAGNOSIS — L239 Allergic contact dermatitis, unspecified cause: Secondary | ICD-10-CM

## 2024-05-12 MED ORDER — METHYLPREDNISOLONE ACETATE 80 MG/ML IJ SUSP
60.0000 mg | Freq: Once | INTRAMUSCULAR | Status: AC
  Administered 2024-05-12: 60 mg via INTRAMUSCULAR

## 2024-05-12 MED ORDER — CETIRIZINE HCL 10 MG PO TABS: 10.0000 mg | ORAL_TABLET | Freq: Every day | ORAL | 0 refills | Status: AC

## 2024-05-12 NOTE — Discharge Instructions (Signed)
 We have given you a steroid injection today and I have prescribed an antihistamine for you to take until rash resolves.  Avoid any scented or irritating products.  Follow-up for worsening or unresolving symptoms.

## 2024-05-12 NOTE — ED Triage Notes (Signed)
 Pt reports rash to chest,lower back since Sunday. Pt denies any new self care products or medications. Denies pain but reports intermittent itching.

## 2024-05-12 NOTE — ED Provider Notes (Signed)
 " [facility]    CSN: [id number] Arrival date & time: 05/12/24  1716      History   Chief Complaint Chief Complaint  Patient presents with   Rash    HPI Angel Rivera is a 74 y.o. female.   Patient presenting today with itchy rash that started on her anterior neck and is now spread to chest, lower back, abdomen, arms.  Denies any new self-care products, new supplements or medications, new foods or other known exposures.  Denies throat itching or swelling, chest tightness, shortness of breath, abdominal pain, vomiting, diarrhea.  Trying aloe vera, over-the-counter creams with no relief.    Past Medical History:  Diagnosis Date   Arthritis    Coronary artery disease    NATIVE VESSEL (ICD-414.01)-cath 08/17/09 with subtotally occluded RCA, moderate disease LAD.   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   Unilateral primary osteoarthritis, 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   Unilateral primary osteoarthritis, right hip 03/23/2020   Fracture, Colles, left, closed 09/06/17 09/23/2017   Encounter for HCV screening test for high risk patient 05/16/2017   MYOCARDIAL INFARCTION, 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   CAD (coronary artery disease) 09/09/2009   HYPOTENSION 09/09/2009    Past Surgical History:  Procedure Laterality Date   ABDOMINAL HYSTERECTOMY  1980s   laparoscopic   CARDIAC CATHETERIZATION  N/A 12/30/2015   Procedure: Left Heart Cath and Coronary Angiography;  Surgeon: Lonni JONETTA Cash, MD;  Location: [facility] INVASIVE CV LAB;  Service: Cardiovascular;  Laterality: N/A;   CARDIAC CATHETERIZATION N/A 12/30/2015   Procedure: Coronary Stent Intervention;  Surgeon: Lonni JONETTA Cash, MD;  Location: [facility] INVASIVE CV LAB;  Service: Cardiovascular;  Laterality: N/A;   CORONARY ANGIOPLASTY WITH STENT PLACEMENT  08/17/2009   1 stent   KNEE ARTHROSCOPY WITH MEDIAL MENISECTOMY Right 01/30/2022   Procedure: KNEE ARTHROSCOPY WITH MEDIAL MENISECTOMY;  Surgeon: Margrette Taft BRAVO, MD;  Location: [facility];  Service: Orthopedics;  Laterality: Right;   TOTAL HIP ARTHROPLASTY Right 06/17/2020   Procedure: RIGHT TOTAL HIP ARTHROPLASTY ANTERIOR APPROACH;  Surgeon: Vernetta Lonni GRADE, MD;  Location: [facility];  Service: Orthopedics;  Laterality: Right;   TOTAL HIP ARTHROPLASTY Left 10/21/2020   Procedure: LEFT TOTAL HIP ARTHROPLASTY ANTERIOR APPROACH;  Surgeon: Vernetta Lonni GRADE, MD;  Location: [facility];  Service: Orthopedics;  Laterality: Left;   TRIGGER FINGER RELEASE Left 01/21/2024   Procedure: RELEASE, A1 PULLEY, FOR TRIGGER FINGER left Middle (long) finger;  Surgeon: Margrette Taft BRAVO, MD;  Location: [facility];  Service: Orthopedics;  Laterality: Left;   TUBAL LIGATION  ~ 1980    OB History   No obstetric history on file.      Home Medications    Prior to Admission medications  Medication Sig Start Date End Date Taking? Authorizing Provider  cetirizine  (ZYRTEC  ALLERGY) 10 MG tablet Take 1 tablet (10  mg total) by mouth daily. 05/12/24  Yes Stuart Vernell Norris, PA-C  ascorbic acid  (VITAMIN C) 500 MG tablet Take 500 mg by mouth in the morning.    [provider]  aspirin  EC 81 MG tablet Take 81 mg by mouth in the morning. Swallow whole.    [provider]  B Complex-C (B-COMPLEX WITH VITAMIN C) tablet Take 1 tablet by mouth in the morning.    [provider]   Biotin  5000 MCG TABS Take 5,000 mcg by mouth in the morning.    [provider]  chlorhexidine  (HIBICLENS ) 4 % external liquid Apply a dime size of the solution to warm water  and cleanse the affected area twice daily until symptoms improve. 04/21/24   Leath-Warren, Etta PARAS, NP  cholecalciferol  (VITAMIN D ) 25 MCG (1000 UNIT) tablet Take 1,000 Units by mouth in the morning.    [provider]  furosemide  (LASIX ) 40 MG tablet Take 1 tablet (40 mg total) by mouth daily as needed. 06/24/23   Verlin Lonni BIRCH, MD  LORazepam  (ATIVAN ) 0.5 MG tablet Take 1 tablet (0.5 mg total) by mouth 2 (two) times daily. 04/13/24   Bevely Doffing, FNP  lovastatin  (MEVACOR ) 40 MG tablet TAKE 1 TABLET(40 MG) BY MOUTH AT BEDTIME 06/24/23   Verlin Lonni BIRCH, MD  metoprolol  succinate (TOPROL -XL) 25 MG 24 hr tablet TAKE 1/2 TABLET(12.5 MG) BY MOUTH DAILY 04/21/24   Verlin Lonni BIRCH, MD  milk thistle 175 MG tablet Take 175 mg by mouth in the morning.    [provider]  mupirocin  ointment (BACTROBAN ) 2 % Apply 1 Application topically 2 (two) times daily. 04/21/24   Leath-Warren, Etta PARAS, NP    Family History Family History  Problem Relation Age of Onset   Hypertension Mother    Heart attack Mother    Heart disease Father        bypass   Heart disease Brother        bypass   Coronary artery disease Other    Colon cancer Neg Hx     Social History Social History[1]   Allergies   Lipitor [atorvastatin ]   Review of Systems Review of Systems PER HPI  Physical Exam Triage Vital Signs ED Triage Vitals [05/12/24 1834]  Encounter Vitals Group     BP (!) 140/73     Girls Systolic BP Percentile      Girls Diastolic BP Percentile      Boys Systolic BP Percentile      Boys Diastolic BP Percentile      Pulse Rate 68     Resp 20     Temp 98 F (36.7 C)     Temp Source Oral     SpO2 97 %     Weight      Height      Head Circumference      Peak Flow       Pain Score 0     Pain Loc      Pain Education      Exclude from Growth Chart    No data found.  Updated Vital Signs BP (!) 140/73 (BP Location: Right Arm)   Pulse 68   Temp 98 F (36.7 C) (Oral)   Resp 20   SpO2 97%   Visual Acuity Right Eye Distance:   Left Eye Distance:   Bilateral Distance:    Right Eye Near:   Left Eye Near:    Bilateral Near:     Physical  Exam Vitals and nursing note reviewed.  Constitutional:      Appearance: Normal appearance. She is not ill-appearing.  HENT:     Head: Atraumatic.  Eyes:     Extraocular Movements: Extraocular movements intact.     Conjunctiva/sclera: Conjunctivae normal.  Cardiovascular:     Rate and Rhythm: Normal rate.  Pulmonary:     Effort: Pulmonary effort is normal.  Musculoskeletal:        General: Normal range of motion.     Cervical back: Normal range of motion and neck supple.  Skin:    General: Skin is warm.     Findings: Rash present.     Comments: Diffuse erythematous maculopapular rash widespread across neck, chest, abdomen, lower back, bilateral arms.  Neurological:     Mental Status: She is alert and oriented to person, place, and time.  Psychiatric:        Mood and Affect: Mood normal.        Thought Content: Thought content normal.        Judgment: Judgment normal.      UC Treatments / Results  Labs (all labs ordered are listed, but only abnormal results are displayed) Labs Reviewed - No data to display  EKG   Radiology No results found.  Procedures Procedures (including critical care time)  Medications Ordered in UC Medications  methylPREDNISolone  acetate (DEPO-MEDROL ) injection 60 mg (60 mg Intramuscular Given 05/12/24 1917)    Initial Impression / Assessment and Plan / UC Course  I have reviewed the triage vital signs and the nursing notes.  Pertinent labs & imaging results that were available during my care of the patient were reviewed by me and considered in my medical decision  making (see chart for details).     Unclear trigger for allergic dermatitis.  Treat with IM Solu-Medrol , Zyrtec , supportive over-the-counter medications and home care.  Return for worsening or unresolving symptoms.  Final Clinical Impressions(s) / UC Diagnoses   Final diagnoses:  Allergic dermatitis     Discharge Instructions      We have given you a steroid injection today and I have prescribed an antihistamine for you to take until rash resolves.  Avoid any scented or irritating products.  Follow-up for worsening or unresolving symptoms.    ED Prescriptions     Medication Sig Dispense Auth. Provider   cetirizine  (ZYRTEC  ALLERGY) 10 MG tablet Take 1 tablet (10 mg total) by mouth daily. 20 tablet Stuart Vernell Norris, [place]      PDMP not reviewed this encounter.    [1]  Social History Tobacco Use   Smoking status: Former    Current packs/day: 0.00    Average packs/day: 0.5 packs/day for 33.0 years (16.5 ttl pk-yrs)    Types: Cigarettes    Start date: 05/07/1980    Quit date: 05/07/2013    Years since quitting: 11.0   Smokeless tobacco: Never  Vaping Use   Vaping status: Never Used  Substance Use Topics   Alcohol use: Not Currently    Comment: 12/30/2015 might have a drink once/year   Drug use: No     Stuart Vernell Norris, PA-C 05/12/24 1950  "

## 2024-05-13 ENCOUNTER — Ambulatory Visit (INDEPENDENT_AMBULATORY_CARE_PROVIDER_SITE_OTHER)

## 2024-05-13 ENCOUNTER — Ambulatory Visit: Payer: Self-pay

## 2024-05-13 VITALS — BP 129/78 | HR 77 | Ht 63.0 in | Wt 135.0 lb

## 2024-05-13 DIAGNOSIS — R21 Rash and other nonspecific skin eruption: Secondary | ICD-10-CM

## 2024-05-13 NOTE — Telephone Encounter (Signed)
 FYI Only or Action Required?: Action required by provider: clinical question for provider and update on patient condition.  Patient was last seen in primary care on 11/13/2023 by Angel Wilhelmena Lloyd Hilario, FNP.  Called Nurse Triage reporting Rash.  Symptoms began several days ago.  Interventions attempted: Prescription medications: depo-medrol  IM inj; children allergy relief liquid.  Symptoms are: gradually worsening.  Triage Disposition: See Physician Within 24 Hours  Patient/caregiver understands and will follow disposition?: Yes    Copied from CRM #[id number]. Topic: Clinical - Red Word Triage >> May 13, 2024 10:08 AM Angel Rivera wrote: Red Word that prompted transfer to Nurse Triage: Pt calling reports, she started having irritation with rash on Sunday, has progressively worsened.  Rash worsening is worsening, itching and irritation worsening, now with raised bumps/hives, has also spread from mid back, to neck, chest, breast, and underneath breast.  Pt states she was seen at UC on yesterday, shot of Depo medrol  given.  Pt requesting an appt for further evaluation.   Reason for Disposition  SEVERE itching (i.e., interferes with sleep, normal activities or school)  Answer Assessment - Initial Assessment Questions Pt called in to reports worsening red, bumpy rash on neck, back, chest/breasts since Sunday. Pt has tried children's allergy relief liquid, 5mL q12h; pt states that she was told in the past d/Rivera age she could not take antihistamines and to take children strength. Pt denies any relief. Pt went to [facility] 05/12/24 and dx with allergic dermatitis and given IM depo-medrol  injection and rx of Zrytec, pt has not picked up script at this time. Pt denies any fever, no rash on face, rash is not warm to the touch; no hx of hives. Pt states it is irritatingly itchy but not a 10. She pats it but states nothing is relieving symptoms. Discussed importance of luke-warm showers, patting drying then  applying moisturizing cream. Discussed antihistamines but that I would reach out to PCP for clarification d/Rivera pt being told in the past to not take adult strength. No appts with clinic until 05/22/24; pt advised to try home care and clinic would reach out with further recommendations. Pt voiced appreciation.     1. APPEARANCE of RASH: What does the rash look like? (e.g., blisters, dry flaky skin, red spots, redness, sores)     Red bumps; no fluid, no blisters   2. SIZE: How big are the spots? (e.g., tip of pen, eraser, coin; inches, centimeters)     Tip of pen; small bumps   3. LOCATION: Where is the rash located?     Neck, chest, back, breasts   4. COLOR: What color is the rash? (Note: It is difficult to assess rash color in people with darker-colored skin. When this situation occurs, simply ask the caller to describe what they see.)     Red, bumpy   5. ONSET: When did the rash begin?     Sunday   6. FEVER: Do you have a fever? If Yes, ask: What is your temperature, how was it measured, and when did it start?     No   7. ITCHING: Does the rash itch? If Yes, ask: How bad is the itch? (Scale 1-10; or mild, moderate, severe)     Yes; mild, irritating   8. CAUSE: What do you think is causing the rash?     Unsure; UC 05/12/24 allergic dermatitis   9. MEDICINE FACTORS: Have you started any new medicines within the last 2 weeks? (e.g., antibiotics)  No   10. OTHER SYMPTOMS: Do you have any other symptoms? (e.g., dizziness, headache, sore throat, joint pain)       No  Protocols used: Rash or Redness - [facility]

## 2024-05-13 NOTE — Progress Notes (Signed)
 "  Established Patient Office Visit  Subjective   Patient ID: Angel Rivera, female    DOB: [birth year]  Age: 74 y.o. MRN: [id number]  Chief Complaint  Patient presents with   Medical Management of Chronic Issues    Pt states started to have the rash Sunday morning it isnt as bad as it was yesterday located on her neck,Breasts and below her breasts and mid back, no pian just itches     HPI Discussed the use of AI scribe software for clinical note transcription with the patient, who gave verbal consent to proceed.  History of Present Illness    Angel Rivera is a 74 year old female who presents with a rash.  Dermatologic symptoms - Acute onset rash began Sunday morning as erythema on the neck, subsequently spreading to other areas including under the breast - Rash is raised in character - No involvement of the legs  Potential allergens and exposures - No recent changes in detergents, vitamins, or supplements - Switched from Nivea lotion to an oil-based lotion; both are water -based - No new clothing worn without washing first - Prepared and consumed spaghetti, [place]  toast, salad, and homemade coconut custard pie on Saturday; family members who ate the same meal did not develop symptoms - Visited a friend's house on Friday; has visited previously without issue  Recent medical interventions - Received Depo-Medrol  injection at urgent care last night - Has not yet started Zyrtec   Immunization history and recent injuries - Received tetanus vaccine a couple of weeks ago following a minor cat scratch; no antibiotics prescribed - Received influenza vaccine in October - No other recent vaccinations     Patient Active Problem List   Diagnosis Date Noted   Trigger middle finger of left hand 01/21/2024   Cervical lymphadenopathy 11/13/2023   Anxiety in acute stress reaction 10/16/2023   Heme positive stool 04/22/2023   Insomnia 02/28/2023   Need for influenza vaccination 02/28/2023    S/P right knee arthroscopy 01/30/22  02/07/2022   Tear of medial meniscus of right knee, current    Primary osteoarthritis of right knee    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   Unilateral primary osteoarthritis, 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   Unilateral primary osteoarthritis, right hip 03/23/2020   Fracture, Colles, left, closed 09/06/17 09/23/2017   Encounter for HCV screening test for high risk patient 05/16/2017   MYOCARDIAL INFARCTION, 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  Constitutional:      Appearance: Normal appearance.  HENT:     Head: Normocephalic.  Eyes:     Extraocular Movements: Extraocular movements intact.     Pupils: Pupils are equal, round, and reactive to light.  Cardiovascular:     Rate and Rhythm: Normal rate and regular rhythm.  Pulmonary:  Effort: Pulmonary effort is normal.     Breath sounds: Normal breath sounds.  Musculoskeletal:     Cervical back: Normal range of motion and neck supple.  Skin:    Findings: Rash present. Rash is macular.     Comments: Rash on neck, upper back, check and abdomen.    Neurological:     Mental Status: She is alert and oriented to person, place, and time.  Psychiatric:        Mood and Affect: Mood normal.        Thought Content: Thought content normal.       The ASCVD Risk score (Arnett DK, et al., 2019) failed to calculate for the following reasons:   Risk score cannot be calculated  because patient has a medical history suggesting prior/existing ASCVD   * - Cholesterol units were assumed    Assessment & Plan:   Problem List Items Addressed This Visit   None Visit Diagnoses       Rash and nonspecific skin eruption    -  Primary   Acute raised, red rash on neck and upper body. Recent tetanus shot noted. - Start Zyrtec . Consider Benadryl  at bedtime if itching persists. labs ordered.   Relevant Orders   Basic Metabolic Panel (BMET) (Completed)   CBC with Differential/Platelet (Completed)   TSH+T4F+T3Free (Completed)   Sed Rate (ESR) (Completed)       No follow-ups on file.    Leita Longs, FNP  "

## 2024-05-13 NOTE — Telephone Encounter (Signed)
"  scheduled  "

## 2024-05-14 ENCOUNTER — Ambulatory Visit: Payer: Self-pay

## 2024-05-14 LAB — CBC WITH DIFFERENTIAL/PLATELET
Basophils Absolute: 0.1 x10E3/uL (ref 0.0–0.2)
Basos: 1 %
EOS (ABSOLUTE): 0.2 x10E3/uL (ref 0.0–0.4)
Eos: 3 %
Hematocrit: 39.8 % (ref 34.0–46.6)
Hemoglobin: 13.1 g/dL (ref 11.1–15.9)
Immature Grans (Abs): 0 x10E3/uL (ref 0.0–0.1)
Immature Granulocytes: 0 %
Lymphocytes Absolute: 2.3 x10E3/uL (ref 0.7–3.1)
Lymphs: 27 %
MCH: 31.9 pg (ref 26.6–33.0)
MCHC: 32.9 g/dL (ref 31.5–35.7)
MCV: 97 fL (ref 79–97)
Monocytes Absolute: 0.6 x10E3/uL (ref 0.1–0.9)
Monocytes: 7 %
Neutrophils Absolute: 5.4 x10E3/uL (ref 1.4–7.0)
Neutrophils: 62 %
Platelets: 287 x10E3/uL (ref 150–450)
RBC: 4.11 x10E6/uL (ref 3.77–5.28)
RDW: 11.8 % (ref 11.7–15.4)
WBC: 8.7 x10E3/uL (ref 3.4–10.8)

## 2024-05-14 LAB — TSH+T4F+T3FREE
Free T4: 1.02 ng/dL (ref 0.82–1.77)
T3, Free: 2.7 pg/mL (ref 2.0–4.4)
TSH: 2.69 u[IU]/mL (ref 0.450–4.500)

## 2024-05-14 LAB — BASIC METABOLIC PANEL WITH GFR
BUN/Creatinine Ratio: 18 (ref 12–28)
BUN: 19 mg/dL (ref 8–27)
CO2: 23 mmol/L (ref 20–29)
Calcium: 9.6 mg/dL (ref 8.7–10.3)
Chloride: 104 mmol/L (ref 96–106)
Creatinine, Ser: 1.05 mg/dL — ABNORMAL HIGH (ref 0.57–1.00)
Glucose: 82 mg/dL (ref 70–99)
Potassium: 4.7 mmol/L (ref 3.5–5.2)
Sodium: 141 mmol/L (ref 134–144)
eGFR: 56 mL/min/1.73 — ABNORMAL LOW

## 2024-05-14 LAB — SEDIMENTATION RATE: Sed Rate: 7 mm/h (ref 0–40)

## 2024-05-20 ENCOUNTER — Telehealth: Payer: Self-pay

## 2024-05-20 NOTE — Telephone Encounter (Signed)
 Copied from CRM [id number]. Topic: Clinical - Lab/Test Results >> May 20, 2024  4:06 PM Delon DASEN wrote: Reason for CRM: calling for colofit results that were faxed by [facility]- [phone]

## 2024-05-21 NOTE — Telephone Encounter (Signed)
 Message sent to patient.

## 2024-07-08 ENCOUNTER — Ambulatory Visit: Admitting: Cardiovascular Disease

## 2024-07-13 ENCOUNTER — Ambulatory Visit
